# Patient Record
Sex: Male | Born: 1935 | Race: Black or African American | Hispanic: No | Smoking: Former smoker
Health system: Southern US, Community
[De-identification: ages and names within clinical notes are randomized; demographics above are authoritative.]

## PROBLEM LIST (undated history)

## (undated) DIAGNOSIS — J449 Chronic obstructive pulmonary disease, unspecified: Secondary | ICD-10-CM

## (undated) DIAGNOSIS — I10 Essential (primary) hypertension: Secondary | ICD-10-CM

## (undated) HISTORY — PX: TOTAL HIP ARTHROPLASTY: SHX124

## (undated) HISTORY — PX: LUMBAR DISC SURGERY: SHX700

---

## 1997-05-28 ENCOUNTER — Encounter (HOSPITAL_COMMUNITY): Admission: RE | Admit: 1997-05-28 | Discharge: 1997-08-26 | Payer: Self-pay | Admitting: Cardiology

## 2000-04-26 ENCOUNTER — Encounter: Admission: RE | Admit: 2000-04-26 | Discharge: 2000-04-26 | Payer: Self-pay | Admitting: Cardiology

## 2000-04-26 ENCOUNTER — Encounter: Payer: Self-pay | Admitting: Cardiology

## 2000-08-26 ENCOUNTER — Encounter (INDEPENDENT_AMBULATORY_CARE_PROVIDER_SITE_OTHER): Payer: Self-pay | Admitting: Specialist

## 2000-08-26 ENCOUNTER — Encounter: Payer: Self-pay | Admitting: *Deleted

## 2000-08-26 ENCOUNTER — Ambulatory Visit (HOSPITAL_COMMUNITY): Admission: RE | Admit: 2000-08-26 | Discharge: 2000-08-26 | Payer: Self-pay | Admitting: *Deleted

## 2002-06-19 ENCOUNTER — Encounter: Payer: Self-pay | Admitting: Cardiology

## 2002-06-19 ENCOUNTER — Encounter: Admission: RE | Admit: 2002-06-19 | Discharge: 2002-06-19 | Payer: Self-pay | Admitting: Cardiology

## 2003-07-30 ENCOUNTER — Encounter: Admission: RE | Admit: 2003-07-30 | Discharge: 2003-07-30 | Payer: Self-pay | Admitting: Cardiology

## 2003-09-06 ENCOUNTER — Ambulatory Visit (HOSPITAL_COMMUNITY): Admission: RE | Admit: 2003-09-06 | Discharge: 2003-09-06 | Payer: Self-pay | Admitting: Orthopedic Surgery

## 2004-06-03 ENCOUNTER — Ambulatory Visit (HOSPITAL_COMMUNITY): Admission: RE | Admit: 2004-06-03 | Discharge: 2004-06-03 | Payer: Self-pay | Admitting: *Deleted

## 2004-06-03 ENCOUNTER — Encounter (INDEPENDENT_AMBULATORY_CARE_PROVIDER_SITE_OTHER): Payer: Self-pay | Admitting: *Deleted

## 2004-11-05 ENCOUNTER — Encounter: Admission: RE | Admit: 2004-11-05 | Discharge: 2004-11-05 | Payer: Self-pay | Admitting: Cardiology

## 2005-03-25 ENCOUNTER — Encounter: Admission: RE | Admit: 2005-03-25 | Discharge: 2005-03-25 | Payer: Self-pay | Admitting: Cardiology

## 2007-09-21 ENCOUNTER — Encounter: Admission: RE | Admit: 2007-09-21 | Discharge: 2007-09-21 | Payer: Self-pay | Admitting: Cardiology

## 2007-10-04 ENCOUNTER — Encounter: Admission: RE | Admit: 2007-10-04 | Discharge: 2007-10-04 | Payer: Self-pay | Admitting: Cardiology

## 2010-04-15 ENCOUNTER — Ambulatory Visit
Admission: RE | Admit: 2010-04-15 | Discharge: 2010-04-15 | Disposition: A | Discharge status: Home / Self Care | Payer: Medicare Other | Source: Ambulatory Visit | Attending: Cardiology | Admitting: Cardiology

## 2010-04-15 ENCOUNTER — Other Ambulatory Visit: Payer: Self-pay | Admitting: Cardiology

## 2010-04-15 DIAGNOSIS — J449 Chronic obstructive pulmonary disease, unspecified: Secondary | ICD-10-CM

## 2010-07-17 NOTE — Op Note (Signed)
NAME:  Shawn Mathews, Shawn Mathews NO.:  [id number]   MEDICAL RECORD NO.:  [id number]                   PATIENT TYPE:  OIB   LOCATION:  2899                                 FACILITY:  [facility]   PHYSICIAN:  Myrtie Neither, M.D.                 DATE OF BIRTH:  [birth year]   DATE OF PROCEDURE:  09/06/2003  DATE OF DISCHARGE:                                 OPERATIVE REPORT   PREOPERATIVE DIAGNOSIS:  Carpal tunnel syndrome, right wrist.   POSTOPERATIVE DIAGNOSIS:  Carpal tunnel syndrome, right wrist.   ANESTHESIA:  General.   PROCEDURE:  Right carpal tunnel release.   The patient was taken to the operating room, after being given adequate  preop medication, given general anesthesia and intubated.  The right wrist  was prepped with DuraPrep and draped in a sterile manner.  Tourniquet and  bipolar used for hemostasis.  A curving incision was made over the volar  transverse crease of the right wrist, going through the skin and  subcutaneous tissue.  The median nerve was identified proximally.  The Glorious Peach  was placed underneath the fascial tissue and complete release of the  transverse carpal ligament was done.  Copious irrigation was done.  A  portion of the ligament was also resected.  The nerve, itself, was obviously  compressed at that level.  Irrigation was done and closure was done with  nylon.  A compressive dressing was applied and wrist splint applied.  The  patient tolerated the procedure quite well and was sent to the recovery room  in stable, satisfactory condition.  The patient is being discharged home on  Percocet 1 p.o. q.4h. p.r.n. pain, ice packs, elevation, use of a sling,  return to the office in one week.  The patient is being discharged in stable  and satisfactory condition.                                               Myrtie Neither, M.D.    AC/MEDQ  D:  09/06/2003  T:  09/06/2003  Job:  [id number]

## 2010-07-17 NOTE — H&P (Signed)
NAME:  CLETIS, CLACK NO.:  [id number]   MEDICAL RECORD NO.:  [id number]                   PATIENT TYPE:  OIB   LOCATION:  2899                                 FACILITY:  [facility]   PHYSICIAN:  Myrtie Neither, M.D.                 DATE OF BIRTH:  November 23, 1927   DATE OF ADMISSION:  09/06/2003  DATE OF DISCHARGE:                                HISTORY & PHYSICAL   CHIEF COMPLAINT:  Pain and numbness in the right hand.   HISTORY OF PRESENT ILLNESS:  This is a 77 year old who has been followed for  carpal tunnel syndrome bilateral wrist, right being worse than the left.  The patient has been treated with night splinting and anti-inflammatory with  more recent progressive worsening.  Nerve conduction tests does confirm  bilateral carpal tunnel syndrome, right being worse than the left.   PAST MEDICAL HISTORY:  Left total hip replacement in 1995 and in 1989.  Tonsillectomy and adenoidectomy as a child.  Lipoma removed from the left  shoulder.  High blood pressure.  History of blood clots.   ALLERGIES:  Penicillin.   MEDICATIONS:  Coumadin 5 mg, Decadron p.r.n., indomethacin 75 mg b.i.d.,  Diovan ACT 80/12.5 a.m., multi-vitamins.   SOCIAL HISTORY:  The patient does smoke 1/2 pack cigarettes per day.  History of use of alcohol.   FAMILY HISTORY:  Noncontributory.   REVIEW OF SYMPTOMS:  See history of present illness.  Otherwise, occasional  arthritic joint pains.   PHYSICAL EXAMINATION:  VITAL SIGNS:  Temperature 97.5, pulse 60, respirations 16, blood pressure  120/80, height 61 inches, weight 162 pounds.  HEENT:  Normocephalic, EOMI, sclerae clear.  NECK:  Supple.  CHEST:  Clear.  CARDIAC:  S1 and S2 regular.  EXTREMITIES:  Right hand with atrophy, positive Phalen's, positive Tinel's  sign, grip and pinch fair.  Some hypoesthesia in the long and index finger.  Nerve conduction tests demonstrate carpal tunnel syndrome right wrist.   IMPRESSION:  Carpal  tunnel syndrome, right wrist.   PLAN:  Right carpal tunnel release.                                               Myrtie Neither, M.D.   AC/MEDQ  D:  09/06/2003  T:  09/06/2003  Job:  [id number]

## 2011-03-30 ENCOUNTER — Emergency Department (INDEPENDENT_AMBULATORY_CARE_PROVIDER_SITE_OTHER)
Admission: EM | Admit: 2011-03-30 | Discharge: 2011-03-30 | Disposition: A | Discharge status: Home / Self Care | Payer: Medicare Other | Source: Home / Self Care | Attending: Emergency Medicine | Admitting: Emergency Medicine

## 2011-03-30 ENCOUNTER — Encounter (HOSPITAL_COMMUNITY): Payer: Self-pay | Admitting: *Deleted

## 2011-03-30 ENCOUNTER — Emergency Department (INDEPENDENT_AMBULATORY_CARE_PROVIDER_SITE_OTHER): Payer: Medicare Other

## 2011-03-30 ENCOUNTER — Other Ambulatory Visit: Payer: Self-pay

## 2011-03-30 DIAGNOSIS — W19XXXA Unspecified fall, initial encounter: Secondary | ICD-10-CM

## 2011-03-30 DIAGNOSIS — S7010XA Contusion of unspecified thigh, initial encounter: Secondary | ICD-10-CM

## 2011-03-30 DIAGNOSIS — R55 Syncope and collapse: Secondary | ICD-10-CM

## 2011-03-30 DIAGNOSIS — S8000XA Contusion of unspecified knee, initial encounter: Secondary | ICD-10-CM

## 2011-03-30 DIAGNOSIS — IMO0002 Reserved for concepts with insufficient information to code with codable children: Secondary | ICD-10-CM

## 2011-03-30 HISTORY — DX: Essential (primary) hypertension: I10

## 2011-03-30 MED ORDER — HYDROCODONE-ACETAMINOPHEN 5-500 MG PO TABS: 1.0000 | ORAL_TABLET | Freq: Four times a day (QID) | ORAL | Status: AC | PRN

## 2011-03-30 NOTE — ED Notes (Signed)
Pt reports he fell about a week ago at home.  He thinks he passed out and fell on the floor  from a chair.    He has been  having right knee and hip pain and did walk with a limp, but the pain has gotten worse

## 2011-03-30 NOTE — ED Provider Notes (Addendum)
History     CSN: [id number]  Arrival date & time 03/30/11  1050   First MD Initiated Contact with Patient 03/30/11 1106      Chief Complaint  Patient presents with  . Fall    (Consider location/radiation/quality/duration/timing/severity/associated sxs/prior treatment) HPI Comments: At home about 1 week ago, I stood up from a chair and passed out and apparently vomited, since then my R hip and R knee been hurting"   Patient is a 78 y.o. male presenting with fall. The history is provided by a relative and the patient.  Fall The accident occurred more than 1 week ago. The fall occurred while standing. He fell from a height of 1 to 2 ft. He landed on carpet. There was no blood loss. The point of impact was the right knee. The pain is present in the right knee. The pain is at a severity of 7/10. He was ambulatory at the scene. There was no entrapment after the fall. There was no alcohol use involved in the accident. Associated symptoms include vomiting and loss of consciousness. Pertinent negatives include no abdominal pain, no bowel incontinence, no hearing loss and no tingling. The symptoms are aggravated by activity, ambulation and sitting. He has tried nothing for the symptoms. The treatment provided no relief.    Past Medical History  Diagnosis Date  . Hypertension   . Gout   . Glaucoma     Past Surgical History  Procedure Date  . Total hip arthroplasty   . Lumbar disc surgery     Family History  Problem Relation Age of Onset  . Diabetes Brother   . Coronary artery disease Brother   . Stroke Brother   . Diabetes Mother     History  Substance Use Topics  . Smoking status: Former Smoker -- 30 years    Quit date: 03/29/1986  . Smokeless tobacco: Not on file  . Alcohol Use: No      Review of Systems  Gastrointestinal: Positive for vomiting. Negative for abdominal pain and bowel incontinence.  Neurological: Positive for loss of consciousness. Negative for tingling.      Allergies  Review of patient's allergies indicates no known allergies.  Home Medications   Current Outpatient Rx  Name Route Sig Dispense Refill  . ALLOPURINOL 100 MG PO TABS Oral Take by mouth daily.    Marland Kitchen AMLODIPINE BESYLATE 5 MG PO TABS Oral Take 5 mg by mouth daily.    . COLCHICINE 0.6 MG PO TABS Oral Take 0.6 mg by mouth 2 (two) times daily.    Marland Kitchen HYDROCHLOROTHIAZIDE 12.5 MG PO CAPS Oral Take 12.5 mg by mouth daily.    Marland Kitchen OLMESARTAN MEDOXOMIL 40 MG PO TABS Oral Take 40 mg by mouth daily.      BP 97/67  Pulse 89  Temp(Src) 97.8 F (36.6 C) (Oral)  Resp 20  SpO2 96%  Physical Exam  Nursing note and vitals reviewed. Constitutional: He appears well-nourished. No distress.  Eyes: Conjunctivae are normal.  Neck: Neck supple.  Cardiovascular: Regular rhythm.   Pulmonary/Chest: Breath sounds normal. No accessory muscle usage. No respiratory distress. He has no decreased breath sounds. He has no wheezes. He has no rhonchi.  Abdominal: There is no tenderness.  Musculoskeletal:       Right hip: He exhibits decreased range of motion and tenderness. He exhibits normal strength, no swelling, no crepitus and no deformity.       Right knee: He exhibits decreased range of motion and bony  tenderness. He exhibits normal alignment and normal patellar mobility. tenderness found. Medial joint line and lateral joint line tenderness noted.  Skin: No rash noted. No erythema.    ED Course  Procedures (including critical care time)  Labs Reviewed - No data to display No results found.   No diagnosis found.    MDM  Reports syncopal episode last Wednesday, and R hip and R knee pain since fall 7 days ago. Denies presyncopal episodes, occurred after standing up abruptly, non significant abnormalities on EKG today        Jimmie Molly, MD 03/30/11 1222  Jimmie Molly, MD 03/31/11 [phone]

## 2011-06-09 ENCOUNTER — Other Ambulatory Visit: Payer: Self-pay | Admitting: Cardiology

## 2011-06-09 DIAGNOSIS — R55 Syncope and collapse: Secondary | ICD-10-CM

## 2011-06-11 ENCOUNTER — Telehealth: Payer: Self-pay

## 2011-06-15 ENCOUNTER — Ambulatory Visit
Admission: RE | Admit: 2011-06-15 | Discharge: 2011-06-15 | Disposition: A | Discharge status: Home / Self Care | Payer: Medicare Other | Source: Ambulatory Visit | Attending: Cardiology | Admitting: Cardiology

## 2011-06-15 DIAGNOSIS — R55 Syncope and collapse: Secondary | ICD-10-CM

## 2011-06-16 ENCOUNTER — Encounter (INDEPENDENT_AMBULATORY_CARE_PROVIDER_SITE_OTHER): Payer: Medicare Other

## 2011-06-16 DIAGNOSIS — R55 Syncope and collapse: Secondary | ICD-10-CM

## 2011-10-20 NOTE — Telephone Encounter (Signed)
Patient had monitor

## 2013-10-24 ENCOUNTER — Other Ambulatory Visit (HOSPITAL_COMMUNITY): Payer: Self-pay | Admitting: Cardiology

## 2013-10-24 DIAGNOSIS — R5381 Other malaise: Secondary | ICD-10-CM

## 2013-10-24 DIAGNOSIS — R5383 Other fatigue: Principal | ICD-10-CM

## 2013-10-24 DIAGNOSIS — R109 Unspecified abdominal pain: Secondary | ICD-10-CM

## 2013-10-24 DIAGNOSIS — R000 Tachycardia, unspecified: Secondary | ICD-10-CM

## 2013-10-24 DIAGNOSIS — R52 Pain, unspecified: Secondary | ICD-10-CM

## 2013-10-24 DIAGNOSIS — R188 Other ascites: Secondary | ICD-10-CM

## 2013-10-25 ENCOUNTER — Other Ambulatory Visit (HOSPITAL_COMMUNITY): Payer: Self-pay | Admitting: Unknown Physician Specialty

## 2013-10-25 DIAGNOSIS — R011 Cardiac murmur, unspecified: Secondary | ICD-10-CM

## 2013-10-25 DIAGNOSIS — I159 Secondary hypertension, unspecified: Secondary | ICD-10-CM

## 2013-10-29 ENCOUNTER — Inpatient Hospital Stay (HOSPITAL_COMMUNITY): Payer: Medicare Other

## 2013-10-29 ENCOUNTER — Ambulatory Visit (HOSPITAL_COMMUNITY)
Admission: RE | Admit: 2013-10-29 | Discharge: 2013-10-29 | Disposition: A | Discharge status: Home / Self Care | Payer: Medicare Other | Source: Ambulatory Visit | Attending: Cardiology | Admitting: Cardiology

## 2013-10-29 ENCOUNTER — Emergency Department (HOSPITAL_COMMUNITY): Payer: Medicare Other

## 2013-10-29 ENCOUNTER — Inpatient Hospital Stay (HOSPITAL_COMMUNITY)
Admission: EM | Admit: 2013-10-29 | Discharge: 2013-11-29 | DRG: 266 | Disposition: E | Payer: Medicare Other | Attending: Vascular Surgery | Admitting: Vascular Surgery

## 2013-10-29 ENCOUNTER — Encounter (HOSPITAL_COMMUNITY): Payer: Self-pay | Admitting: Emergency Medicine

## 2013-10-29 DIAGNOSIS — D649 Anemia, unspecified: Secondary | ICD-10-CM | POA: Diagnosis present

## 2013-10-29 DIAGNOSIS — I080 Rheumatic disorders of both mitral and aortic valves: Secondary | ICD-10-CM | POA: Diagnosis present

## 2013-10-29 DIAGNOSIS — K3189 Other diseases of stomach and duodenum: Secondary | ICD-10-CM | POA: Diagnosis present

## 2013-10-29 DIAGNOSIS — E43 Unspecified severe protein-calorie malnutrition: Secondary | ICD-10-CM | POA: Diagnosis present

## 2013-10-29 DIAGNOSIS — Z006 Encounter for examination for normal comparison and control in clinical research program: Secondary | ICD-10-CM | POA: Diagnosis not present

## 2013-10-29 DIAGNOSIS — I129 Hypertensive chronic kidney disease with stage 1 through stage 4 chronic kidney disease, or unspecified chronic kidney disease: Secondary | ICD-10-CM | POA: Diagnosis present

## 2013-10-29 DIAGNOSIS — M1990 Unspecified osteoarthritis, unspecified site: Secondary | ICD-10-CM | POA: Diagnosis present

## 2013-10-29 DIAGNOSIS — Z66 Do not resuscitate: Secondary | ICD-10-CM | POA: Diagnosis not present

## 2013-10-29 DIAGNOSIS — N5089 Other specified disorders of the male genital organs: Secondary | ICD-10-CM | POA: Diagnosis present

## 2013-10-29 DIAGNOSIS — R7309 Other abnormal glucose: Secondary | ICD-10-CM | POA: Diagnosis not present

## 2013-10-29 DIAGNOSIS — R6520 Severe sepsis without septic shock: Secondary | ICD-10-CM

## 2013-10-29 DIAGNOSIS — IMO0002 Reserved for concepts with insufficient information to code with codable children: Secondary | ICD-10-CM | POA: Diagnosis not present

## 2013-10-29 DIAGNOSIS — R579 Shock, unspecified: Secondary | ICD-10-CM

## 2013-10-29 DIAGNOSIS — D696 Thrombocytopenia, unspecified: Secondary | ICD-10-CM | POA: Diagnosis present

## 2013-10-29 DIAGNOSIS — R570 Cardiogenic shock: Secondary | ICD-10-CM | POA: Diagnosis not present

## 2013-10-29 DIAGNOSIS — N289 Disorder of kidney and ureter, unspecified: Secondary | ICD-10-CM | POA: Diagnosis present

## 2013-10-29 DIAGNOSIS — I714 Abdominal aortic aneurysm, without rupture, unspecified: Secondary | ICD-10-CM | POA: Diagnosis present

## 2013-10-29 DIAGNOSIS — E872 Acidosis, unspecified: Secondary | ICD-10-CM | POA: Diagnosis present

## 2013-10-29 DIAGNOSIS — I359 Nonrheumatic aortic valve disorder, unspecified: Secondary | ICD-10-CM

## 2013-10-29 DIAGNOSIS — J811 Chronic pulmonary edema: Secondary | ICD-10-CM | POA: Diagnosis not present

## 2013-10-29 DIAGNOSIS — Z515 Encounter for palliative care: Secondary | ICD-10-CM | POA: Diagnosis not present

## 2013-10-29 DIAGNOSIS — I350 Nonrheumatic aortic (valve) stenosis: Secondary | ICD-10-CM | POA: Diagnosis present

## 2013-10-29 DIAGNOSIS — E8779 Other fluid overload: Secondary | ICD-10-CM | POA: Diagnosis not present

## 2013-10-29 DIAGNOSIS — R011 Cardiac murmur, unspecified: Secondary | ICD-10-CM | POA: Diagnosis present

## 2013-10-29 DIAGNOSIS — E876 Hypokalemia: Secondary | ICD-10-CM | POA: Diagnosis present

## 2013-10-29 DIAGNOSIS — R1013 Epigastric pain: Secondary | ICD-10-CM

## 2013-10-29 DIAGNOSIS — D689 Coagulation defect, unspecified: Secondary | ICD-10-CM | POA: Diagnosis present

## 2013-10-29 DIAGNOSIS — N179 Acute kidney failure, unspecified: Secondary | ICD-10-CM

## 2013-10-29 DIAGNOSIS — R6521 Severe sepsis with septic shock: Secondary | ICD-10-CM

## 2013-10-29 DIAGNOSIS — H409 Unspecified glaucoma: Secondary | ICD-10-CM | POA: Diagnosis present

## 2013-10-29 DIAGNOSIS — R109 Unspecified abdominal pain: Secondary | ICD-10-CM

## 2013-10-29 DIAGNOSIS — J9600 Acute respiratory failure, unspecified whether with hypoxia or hypercapnia: Secondary | ICD-10-CM | POA: Diagnosis not present

## 2013-10-29 DIAGNOSIS — Z87891 Personal history of nicotine dependence: Secondary | ICD-10-CM | POA: Diagnosis not present

## 2013-10-29 DIAGNOSIS — N189 Chronic kidney disease, unspecified: Secondary | ICD-10-CM | POA: Diagnosis present

## 2013-10-29 DIAGNOSIS — Z880 Allergy status to penicillin: Secondary | ICD-10-CM | POA: Diagnosis not present

## 2013-10-29 DIAGNOSIS — Z79899 Other long term (current) drug therapy: Secondary | ICD-10-CM | POA: Diagnosis not present

## 2013-10-29 DIAGNOSIS — R1084 Generalized abdominal pain: Secondary | ICD-10-CM | POA: Diagnosis present

## 2013-10-29 DIAGNOSIS — K559 Vascular disorder of intestine, unspecified: Secondary | ICD-10-CM | POA: Diagnosis not present

## 2013-10-29 DIAGNOSIS — I2510 Atherosclerotic heart disease of native coronary artery without angina pectoris: Secondary | ICD-10-CM | POA: Diagnosis present

## 2013-10-29 DIAGNOSIS — Z96649 Presence of unspecified artificial hip joint: Secondary | ICD-10-CM | POA: Diagnosis not present

## 2013-10-29 DIAGNOSIS — I743 Embolism and thrombosis of arteries of the lower extremities: Secondary | ICD-10-CM | POA: Diagnosis not present

## 2013-10-29 DIAGNOSIS — I159 Secondary hypertension, unspecified: Secondary | ICD-10-CM

## 2013-10-29 DIAGNOSIS — I498 Other specified cardiac arrhythmias: Secondary | ICD-10-CM | POA: Diagnosis not present

## 2013-10-29 DIAGNOSIS — I5189 Other ill-defined heart diseases: Secondary | ICD-10-CM | POA: Diagnosis not present

## 2013-10-29 DIAGNOSIS — A419 Sepsis, unspecified organism: Secondary | ICD-10-CM | POA: Diagnosis present

## 2013-10-29 DIAGNOSIS — R601 Generalized edema: Secondary | ICD-10-CM

## 2013-10-29 DIAGNOSIS — J9601 Acute respiratory failure with hypoxia: Secondary | ICD-10-CM

## 2013-10-29 DIAGNOSIS — R34 Anuria and oliguria: Secondary | ICD-10-CM | POA: Diagnosis not present

## 2013-10-29 DIAGNOSIS — R52 Pain, unspecified: Secondary | ICD-10-CM

## 2013-10-29 DIAGNOSIS — M109 Gout, unspecified: Secondary | ICD-10-CM | POA: Diagnosis present

## 2013-10-29 DIAGNOSIS — I214 Non-ST elevation (NSTEMI) myocardial infarction: Secondary | ICD-10-CM | POA: Diagnosis present

## 2013-10-29 DIAGNOSIS — K55029 Acute infarction of small intestine, extent unspecified: Secondary | ICD-10-CM

## 2013-10-29 DIAGNOSIS — I493 Ventricular premature depolarization: Secondary | ICD-10-CM

## 2013-10-29 DIAGNOSIS — J441 Chronic obstructive pulmonary disease with (acute) exacerbation: Secondary | ICD-10-CM | POA: Diagnosis present

## 2013-10-29 DIAGNOSIS — K769 Liver disease, unspecified: Secondary | ICD-10-CM | POA: Diagnosis present

## 2013-10-29 DIAGNOSIS — K5730 Diverticulosis of large intestine without perforation or abscess without bleeding: Secondary | ICD-10-CM | POA: Diagnosis present

## 2013-10-29 DIAGNOSIS — R0600 Dyspnea, unspecified: Secondary | ICD-10-CM

## 2013-10-29 HISTORY — DX: Chronic obstructive pulmonary disease, unspecified: J44.9

## 2013-10-29 LAB — CBC WITH DIFFERENTIAL/PLATELET
BASOS ABS: 0 10*3/uL (ref 0.0–0.1)
BASOS PCT: 0 % (ref 0–1)
EOS ABS: 0.2 10*3/uL (ref 0.0–0.7)
Eosinophils Relative: 3 % (ref 0–5)
HEMATOCRIT: 42.1 % (ref 39.0–52.0)
HEMOGLOBIN: 14.2 g/dL (ref 13.0–17.0)
Lymphocytes Relative: 27 % (ref 12–46)
Lymphs Abs: 1.7 10*3/uL (ref 0.7–4.0)
MCH: 32.1 pg (ref 26.0–34.0)
MCHC: 33.7 g/dL (ref 30.0–36.0)
MCV: 95.2 fL (ref 78.0–100.0)
MONO ABS: 0.6 10*3/uL (ref 0.1–1.0)
MONOS PCT: 9 % (ref 3–12)
NEUTROS PCT: 61 % (ref 43–77)
Neutro Abs: 4 10*3/uL (ref 1.7–7.7)
Platelets: 92 10*3/uL — ABNORMAL LOW (ref 150–400)
RBC: 4.42 MIL/uL (ref 4.22–5.81)
RDW: 15.9 % — AB (ref 11.5–15.5)
WBC: 6.5 10*3/uL (ref 4.0–10.5)

## 2013-10-29 LAB — I-STAT CHEM 8, ED
BUN: 15 mg/dL (ref 6–23)
CHLORIDE: 107 meq/L (ref 96–112)
Calcium, Ion: 1.23 mmol/L (ref 1.13–1.30)
Creatinine, Ser: 1 mg/dL (ref 0.50–1.35)
Glucose, Bld: 91 mg/dL (ref 70–99)
HEMATOCRIT: 44 % (ref 39.0–52.0)
Hemoglobin: 15 g/dL (ref 13.0–17.0)
Potassium: 3.8 mEq/L (ref 3.7–5.3)
SODIUM: 139 meq/L (ref 137–147)
TCO2: 23 mmol/L (ref 0–100)

## 2013-10-29 LAB — COMPREHENSIVE METABOLIC PANEL
ALK PHOS: 54 U/L (ref 39–117)
ALT: 10 U/L (ref 0–53)
AST: 28 U/L (ref 0–37)
Albumin: 3.5 g/dL (ref 3.5–5.2)
Anion gap: 14 (ref 5–15)
BILIRUBIN TOTAL: 0.6 mg/dL (ref 0.3–1.2)
BUN: 14 mg/dL (ref 6–23)
CALCIUM: 9 mg/dL (ref 8.4–10.5)
CO2: 22 mEq/L (ref 19–32)
CREATININE: 0.92 mg/dL (ref 0.50–1.35)
Chloride: 102 mEq/L (ref 96–112)
GFR, EST AFRICAN AMERICAN: 87 mL/min — AB (ref >90)
GFR, EST NON AFRICAN AMERICAN: 75 mL/min — AB (ref >90)
Glucose, Bld: 91 mg/dL (ref 70–99)
Potassium: 4 mEq/L (ref 3.7–5.3)
Sodium: 138 mEq/L (ref 137–147)
Total Protein: 6.5 g/dL (ref 6.0–8.3)

## 2013-10-29 LAB — PREPARE RBC (CROSSMATCH)

## 2013-10-29 LAB — I-STAT CG4 LACTIC ACID, ED: Lactic Acid, Venous: 0.87 mmol/L (ref 0.5–2.2)

## 2013-10-29 LAB — ABO/RH: ABO/RH(D): A POS

## 2013-10-29 LAB — LIPASE, BLOOD: LIPASE: 25 U/L (ref 11–59)

## 2013-10-29 LAB — TROPONIN I: Troponin I: <0.3 ng/mL (ref <0.30)

## 2013-10-29 MED ORDER — ACETAMINOPHEN 325 MG PO TABS: 325.0000 mg | ORAL_TABLET | ORAL | Status: DC | PRN

## 2013-10-29 MED ORDER — LABETALOL HCL 5 MG/ML IV SOLN
10.0000 mg | INTRAVENOUS | Status: DC | PRN
  Filled 2013-10-29: qty 4

## 2013-10-29 MED ORDER — IOHEXOL 350 MG/ML SOLN
100.0000 mL | Freq: Once | INTRAVENOUS | Status: AC | PRN
  Administered 2013-10-29: 100 mL via INTRAVENOUS

## 2013-10-29 MED ORDER — METOPROLOL TARTRATE 1 MG/ML IV SOLN: 2.5000 mg | Freq: Four times a day (QID) | INTRAVENOUS | Status: DC | PRN

## 2013-10-29 MED ORDER — METOPROLOL TARTRATE 1 MG/ML IV SOLN: 2.0000 mg | INTRAVENOUS | Status: DC | PRN

## 2013-10-29 MED ORDER — DOCUSATE SODIUM 100 MG PO CAPS
100.0000 mg | ORAL_CAPSULE | Freq: Two times a day (BID) | ORAL | Status: DC
  Administered 2013-10-29 – 2013-11-05 (×14): 100 mg via ORAL
  Filled 2013-10-29 (×18): qty 1

## 2013-10-29 MED ORDER — TRAMADOL HCL 50 MG PO TABS
50.0000 mg | ORAL_TABLET | Freq: Four times a day (QID) | ORAL | Status: DC | PRN
  Administered 2013-11-04: 50 mg via ORAL
  Filled 2013-10-29: qty 1

## 2013-10-29 MED ORDER — PANTOPRAZOLE SODIUM 40 MG PO TBEC
40.0000 mg | DELAYED_RELEASE_TABLET | Freq: Every day | ORAL | Status: DC
  Administered 2013-10-29 – 2013-11-01 (×4): 40 mg via ORAL
  Filled 2013-10-29 (×4): qty 1

## 2013-10-29 MED ORDER — ONDANSETRON HCL 4 MG/2ML IJ SOLN
4.0000 mg | Freq: Four times a day (QID) | INTRAMUSCULAR | Status: DC | PRN
  Administered 2013-11-06: 4 mg via INTRAVENOUS
  Filled 2013-10-29: qty 2

## 2013-10-29 MED ORDER — ALLOPURINOL 100 MG PO TABS
100.0000 mg | ORAL_TABLET | Freq: Every day | ORAL | Status: DC
  Administered 2013-10-30 – 2013-11-05 (×7): 100 mg via ORAL
  Filled 2013-10-29 (×9): qty 1

## 2013-10-29 MED ORDER — SODIUM CHLORIDE 0.9 % IV SOLN
INTRAVENOUS | Status: DC
  Administered 2013-10-29 – 2013-10-31 (×3): via INTRAVENOUS

## 2013-10-29 MED ORDER — ACETAMINOPHEN 650 MG RE SUPP: 325.0000 mg | RECTAL | Status: DC | PRN

## 2013-10-29 MED ORDER — DORZOLAMIDE HCL 2 % OP SOLN
1.0000 [drp] | Freq: Two times a day (BID) | OPHTHALMIC | Status: DC
  Administered 2013-10-29 – 2013-11-20 (×44): 1 [drp] via OPHTHALMIC
  Filled 2013-10-29 (×5): qty 10

## 2013-10-29 MED ORDER — GUAIFENESIN-DM 100-10 MG/5ML PO SYRP: 15.0000 mL | ORAL_SOLUTION | ORAL | Status: DC | PRN

## 2013-10-29 MED ORDER — SODIUM CHLORIDE 0.9 % IV SOLN: Freq: Once | INTRAVENOUS | Status: DC

## 2013-10-29 MED ORDER — ALUM & MAG HYDROXIDE-SIMETH 200-200-20 MG/5ML PO SUSP: 15.0000 mL | ORAL | Status: DC | PRN

## 2013-10-29 MED ORDER — HYDRALAZINE HCL 20 MG/ML IJ SOLN: 10.0000 mg | INTRAMUSCULAR | Status: DC | PRN

## 2013-10-29 MED ORDER — AMLODIPINE BESYLATE 5 MG PO TABS
5.0000 mg | ORAL_TABLET | Freq: Every day | ORAL | Status: DC
  Administered 2013-10-29 – 2013-11-05 (×8): 5 mg via ORAL
  Filled 2013-10-29 (×10): qty 1

## 2013-10-29 MED ORDER — POTASSIUM CHLORIDE CRYS ER 20 MEQ PO TBCR
20.0000 meq | EXTENDED_RELEASE_TABLET | Freq: Once | ORAL | Status: AC
  Administered 2013-10-29: 20 meq via ORAL
  Filled 2013-10-29: qty 1

## 2013-10-29 MED ORDER — PHENOL 1.4 % MT LIQD: 1.0000 | OROMUCOSAL | Status: DC | PRN

## 2013-10-29 MED ORDER — ENOXAPARIN SODIUM 40 MG/0.4ML ~~LOC~~ SOLN: 40.0000 mg | SUBCUTANEOUS | Status: DC

## 2013-10-29 MED ORDER — MORPHINE SULFATE 2 MG/ML IJ SOLN: 2.0000 mg | INTRAMUSCULAR | Status: DC | PRN

## 2013-10-29 NOTE — ED Provider Notes (Signed)
CSN: [id number]     Arrival date & time 06-Nov-2013  1536 History   First MD Initiated Contact with Patient 11/09/2013 1617     Chief Complaint  Patient presents with  . Abdominal Pain     (Consider location/radiation/quality/duration/timing/severity/associated sxs/prior Treatment) HPI Comments: 80 year old male past smoker, hip replacement, high blood pressure, gout presents with abdominal pain and new diagnosis of abdominal aortic aneurysm. Patient had ultrasound done earlier today showing significant size aneurysm. Patient's had nonspecific abdominal pain for 1-2 weeks central nonradiating. Patient denies bleeding or significant blood thinners. No history of aneurysm.  Patient is a 80 y.o. male presenting with abdominal pain. The history is provided by the patient.  Abdominal Pain Associated symptoms: no chest pain, no chills, no diarrhea, no dysuria, no fever, no shortness of breath and no vomiting     Past Medical History  Diagnosis Date  . Hypertension   . Gout   . Glaucoma    Past Surgical History  Procedure Laterality Date  . Total hip arthroplasty    . Lumbar disc surgery     Family History  Problem Relation Age of Onset  . Diabetes Brother   . Coronary artery disease Brother   . Stroke Brother   . Diabetes Mother    History  Substance Use Topics  . Smoking status: Former Smoker -- 30 years    Quit date: 03/29/1986  . Smokeless tobacco: Not on file  . Alcohol Use: No    Review of Systems  Constitutional: Negative for fever and chills.  HENT: Negative for congestion.   Eyes: Negative for visual disturbance.  Respiratory: Negative for shortness of breath.   Cardiovascular: Negative for chest pain.  Gastrointestinal: Positive for abdominal pain. Negative for vomiting and diarrhea.  Genitourinary: Negative for dysuria and flank pain.  Musculoskeletal: Negative for back pain, neck pain and neck stiffness.  Skin: Negative for rash.  Neurological: Negative for  light-headedness and headaches.      Allergies  Penicillins  Home Medications   Prior to Admission medications   Medication Sig Start Date End Date Taking? Authorizing Provider  allopurinol (ZYLOPRIM) 100 MG tablet Take by mouth daily.   Yes Historical Provider, MD  amLODipine (NORVASC) 5 MG tablet Take 5 mg by mouth daily.   Yes Historical Provider, MD  dorzolamide (TRUSOPT) 2 % ophthalmic solution Place 1 drop into both eyes 2 (two) times daily.  10/11/13  Yes Historical Provider, MD  traMADol (ULTRAM) 50 MG tablet Take 50 mg by mouth every 6 (six) hours as needed for moderate pain.  10/24/13  Yes Historical Provider, MD   BP 145/93  Pulse 67  Temp(Src) 98.2 F (36.8 C) (Oral)  Resp 18  Wt 151 lb 4 oz (68.607 kg)  SpO2 93% Physical Exam  Nursing note and vitals reviewed. Constitutional: He is oriented to person, place, and time. He appears well-developed and well-nourished. No distress.  HENT:  Head: Normocephalic and atraumatic.  Eyes: Conjunctivae are normal. Right eye exhibits no discharge. Left eye exhibits no discharge.  Neck: Normal range of motion. Neck supple. No tracheal deviation present.  Cardiovascular: Normal rate and regular rhythm.   Pulmonary/Chest: Effort normal and breath sounds normal.  Abdominal: Soft. He exhibits distension. There is tenderness (tender mild central, mild distended/pulsatile mass). There is no guarding.  Musculoskeletal: He exhibits no edema.  Neurological: He is alert and oriented to person, place, and time.  Skin: Skin is warm. No rash noted.  Psychiatric: He has a normal  mood and affect.    ED Course  Procedures (including critical care time) CRITICAL CARE Performed by: Enid Skeens   Total critical care time: 35 min  Critical care time was exclusive of separately billable procedures and treating other patients.  Critical care was necessary to treat or prevent imminent or life-threatening deterioration.  Critical care was  time spent personally by me on the following activities: development of treatment plan with patient and/or surrogate as well as nursing, discussions with consultants, evaluation of patient's response to treatment, examination of patient, obtaining history from patient or surrogate, ordering and performing treatments and interventions, ordering and review of laboratory studies, ordering and review of radiographic studies, pulse oximetry and re-evaluation of patient's condition.  Labs Review Labs Reviewed  CBC WITH DIFFERENTIAL - Abnormal; Notable for the following:    RDW 15.9 (*)    Platelets 92 (*)    All other components within normal limits  COMPREHENSIVE METABOLIC PANEL - Abnormal; Notable for the following:    GFR calc non Af Amer 75 (*)    GFR calc Af Amer 87 (*)    All other components within normal limits  TROPONIN I  LIPASE, BLOOD  I-STAT CHEM 8, ED  I-STAT CG4 LACTIC ACID, ED  TYPE AND SCREEN  ABO/RH  PREPARE RBC (CROSSMATCH)    Imaging Review US Abdomen Complete  11/04/13   ADDENDUM REPORT: 11/04/13 12:47  ADDENDUM: Findings and recommendations were discussed with Dr. Shana Chute at 12:40 p.m. As patient has no abdominal pain, the findings are likely stable/chronic. Further evaluation with CT was recommended on an urgent, but not emergent basis.   Electronically Signed   By: Elberta Fortis M.D.   On: November 04, 2013 12:47   11/04/2013   CLINICAL DATA:  Acute abdominal pain.  EXAM: ULTRASOUND ABDOMEN COMPLETE  COMPARISON:  None.  FINDINGS: Exam somewhat limited due to patient body habitus and abundant overlying bowel gas.  Gallbladder:  No gallstones or wall thickening visualized. No sonographic Murphy sign noted.  Common bile duct:  Diameter: 4 mm.  Liver:  No focal lesion identified. Within normal limits in parenchymal echogenicity.  IVC:  Not visualized.  Pancreas:  Visualized portion unremarkable.  Spleen:  Size and appearance within normal limits.  Right Kidney:  Length: 8.6 cm.  Echogenicity within normal limits. No mass or hydronephrosis visualized.  Left Kidney:  Length: 8.7 cm. Echogenicity within normal limits. No mass or hydronephrosis visualized.  Abdominal aorta:  Proximal segment not visualized. Moderate atherosclerotic disease with significant aneurysmal dilatation of the mid to distal abdominal aorta measuring approximately 7.8 x 8.1 cm in its AP and transverse dimensions and extending approximately 10.8 cm in length. There is moderate mural thrombus as the lumen measures approximately 2.2 x 3.2 cm in AP and transverse dimension.  Other findings:  None.  IMPRESSION: Significant aneurysm of the mid to distal abdominal aorta measuring approximately 7.8 x 8.1 cm in its AP and transverse dimensions and extending 10.8 cm in length. Moderate mural thrombus as the lumen measures 2.2 x 3.2 cm. Recommend CT abdomen with and without contrast to exclude contained rupture in this patient with abdominal pain.  These results were called by telephone at the time of interpretation on Nov 04, 2013 at 11:11 am to Dr. Kevin Fenton SPRUILL's recepeptionist, Frederick Peers, , who verbally acknowledged these results. We are unable to reach Dr. Shana Chute at this time. I therefore called patient directly as patient states he has no known history of abdominal aneurysm and states he has  no abdominal pain. We discussed that he needs further evaluation with CT scan and that Dr. Magda Kiel office will be contacting him to scheduled the exam. We discussed that he should proceed to the nearest ER if he experiences abdominal pain.  Electronically Signed: By: Elberta Fortis M.D. On: 10/28/2013 11:26     EKG Interpretation None      MDM   Final diagnoses:  Abdominal aortic aneurysm greater than 39 mm in diameter  Abdominal pain, generalized   Patient with new diagnosis of abdominal area aneurysm and abdominal pain. Paged vascular surgery immediately and they're on their way into the hospital to evaluate. Type  and screen and type and cross ordered and plan for likely surgery.  Vitals okay in ER.  Patient admitted by vascular surgery to plan for surgery. Pain mild in ED. The patients results and plan were reviewed and discussed.   Any x-rays performed were personally reviewed by myself.   Differential diagnosis were considered with the presenting HPI.  Medications  0.9 %  sodium chloride infusion (not administered)  allopurinol (ZYLOPRIM) tablet 100 mg (not administered)  amLODipine (NORVASC) tablet 5 mg (not administered)  dorzolamide (TRUSOPT) 2 % ophthalmic solution 1 drop (not administered)  traMADol (ULTRAM) tablet 50 mg (not administered)  metoprolol (LOPRESSOR) injection 2.5 mg (not administered)  iohexol (OMNIPAQUE) 350 MG/ML injection 100 mL (100 mLs Intravenous Contrast Given 10/14/2013 1825)      Filed Vitals:   10/02/2013 1546 10/09/2013 1619 10/08/2013 1630 10/23/2013 1720  BP: 140/86 137/77  145/93  Pulse: 69 64 71 67  Temp: 98.2 F (36.8 C)     TempSrc: Oral     Resp: Weight: 151 lb 4 oz (68.607 kg)     SpO2: 92% 95% 96% 93%    Admission/ observation were discussed with the admitting physician, patient and/or family and they are comfortable with the plan.      Enid Skeens, MD 10/05/2013 [phone]

## 2013-10-29 NOTE — Progress Notes (Signed)
  Echocardiogram 2D Echocardiogram has been performed.  Shawn Mathews [name] 10/21/2013, 2:07 PM

## 2013-10-29 NOTE — ED Notes (Signed)
Pt transported to CT ?

## 2013-10-29 NOTE — ED Notes (Signed)
Diet tray ordered 

## 2013-10-29 NOTE — ED Notes (Addendum)
Pt reports he had an [place] and CT scan today was diagnosed with 8 mm abdominal aneurysm today. Was sent here by radiologist. Pt had been having abdominal pain for several weeks. Denies n/v. Reports 3/10 pain. Is a x 4. Denies CP. Skin warm and dry. In NAD

## 2013-10-29 NOTE — ED Notes (Signed)
CT called- will get pt to scanner after lab results.

## 2013-10-29 NOTE — ED Notes (Signed)
Pt eating dinner tray then will take pt to floor.

## 2013-10-29 NOTE — H&P (Signed)
   History and Physical  See my consultation dated 10/02/2013   [name], MD [facility] Office: [phone] Pager: [phone]  10/01/2013, 7:25 PM

## 2013-10-29 NOTE — ED Notes (Signed)
Attempted report X1

## 2013-10-29 NOTE — Consult Note (Addendum)
Referred by: [facility]  Reason for referral: AAA  History of Present Illness  The patient is a 80 y.o. (Jul 27, 1927) male who presents with chief complaint: abdominal pain.  Pain started 2 weeks ago without obvious trigger.  Pain has vague character, 4/10, without any radiation or associated sx.  The patient was seen by his PCP and sent for abdominal ultrasound which demonstrates a 8 cm AAA today.  He was triaged to the ED for evaluation for sx.  AAA.  The patient does not history of embolic episodes from the AAA.  The patient's risk factors for AAA included: age, male sex and prior smoking.  The patient smoked cigarettes previous, quitting in mid-1990s.  Past Medical History  Diagnosis Date  . Hypertension   . Gout   . Glaucoma    Osteoarthritis  Past Surgical History  Procedure Laterality Date  . Total hip arthroplasty    . Lumbar disc surgery      History   Social History  . Marital Status: Widowed    Spouse Name: N/A    Number of Children: N/A  . Years of Education: N/A   Occupational History  . Not on file.   Social History Main Topics  . Smoking status: Former Smoker -- 30 years    Quit date: 03/29/1986  . Smokeless tobacco: Not on file  . Alcohol Use: No  . Drug Use: No  . Sexual Activity:    Other Topics Concern  . Not on file   Social History Narrative  . No narrative on file    Family History  Problem Relation Age of Onset  . Diabetes Brother   . Coronary artery disease Brother   . Stroke Brother   . Diabetes Mother     No current facility-administered medications on file prior to encounter.   Current Outpatient Prescriptions on File Prior to Encounter  Medication Sig Dispense Refill  . allopurinol (ZYLOPRIM) 100 MG tablet Take by mouth daily.      Marland Kitchen amLODipine (NORVASC) 5 MG tablet Take 5 mg by mouth daily.        Allergies  Allergen Reactions  . Penicillins     unknown    REVIEW OF SYSTEMS:  (Positives checked otherwise  negative)  CARDIOVASCULAR:   chest pain,  chest pressure,  palpitations,  shortness of breath when laying flat,  shortness of breath with exertion,   pain in feet when walking,  pain in feet when laying flat,  history of blood clot in veins (DVT),  history of phlebitis,  swelling in legs,  varicose veins  PULMONARY:   productive cough,  asthma,  wheezing  NEUROLOGIC:   weakness in arms or legs,  numbness in arms or legs,  difficulty speaking or slurred speech,  temporary loss of vision in one eye,  dizziness  HEMATOLOGIC:   bleeding problems,  problems with blood clotting too easily  MUSCULOSKEL:   joint pain,  joint swelling  GASTROINTEST:   vomiting blood,  blood in stool,  no abd pain     GENITOURINARY:   burning with urination,  blood in urine  PSYCHIATRIC:   history of major depression  INTEGUMENTARY:   rashes,  ulcers  CONSTITUTIONAL:   fever,  chills  For VQI Use Only  PRE-ADM LIVING: Home  AMB STATUS: Ambulatory  CAD Sx: None  PRIOR CHF: None  STRESS TEST:  No,  Normal,  + ischemia,  + MI,  Both  Physical Examination  Filed Vitals:   09/29/2013 1546 10/19/2013 1619 10/26/2013 1630 10/27/2013 1720  BP: 140/86 137/77  145/93  Pulse: 69 64 71 67  Temp: 98.2 F (36.8 C)     TempSrc: Oral     Resp: Weight: 151 lb 4 oz (68.607 kg)     SpO2: 92% 95% 96% 93%   There is no height on file to calculate BMI.  General: A&O x 3, WDWN  Head: [place]/AT  Ear/Nose/Throat: Hearing grossly intact, nares w/o erythema or drainage, oropharynx w/o Erythema/Exudate  Eyes: PERRLA, EOMI  Neck: Supple, no nuchal rigidity, no palpable LAD  Pulmonary: Sym exp, good air movt, CTAB, no rales, rhonchi, & wheezing  Cardiac: RRR, Nl S1, S2, no rubs or gallops, +Murmur  Vascular: Vessel Right Left  Radial Faintly Palpable Faintly Palpable  Brachial Faintly Palpable Faintly Palpable  Carotid  Palpable, without bruit Palpable, with transmitted murmur  Aorta Palpable AAA N/A  Femoral Faintly Palpable Faintly Palpable  Popliteal Not palpable Not palpable  PT Faintly Palpable Not Palpable  DP Faintly Palpable Not Palpable   Gastrointestinal: soft, NTND, -G/R, - HSM, - masses, mild L CVAT, non-tender AAA  Musculoskeletal: M/S 5/5 throughout , Extremities without ischemic changes   Neurologic: CN 2-12 intact , Pain and light touch intact in extremities , Motor exam as listed above  Psychiatric: Judgment intact, Mood & affect appropriate for pt's clinical situation  Dermatologic: See M/S exam for extremity exam, no rashes otherwise noted  Lymph : No Cervical, Axillary, or Inguinal lymphadenopathy   Non-Invasive Vascular Imaging  Radiology: US Abdomen Complete  10/11/2013   ADDENDUM REPORT: 10/12/2013 12:47  ADDENDUM: Findings and recommendations were discussed with Dr. Shana Chute at 12:40 p.m. As patient has no abdominal pain, the findings are likely stable/chronic. Further evaluation with CT was recommended on an urgent, but not emergent basis.   Electronically Signed   By: Elberta Fortis M.D.   On: 10/13/2013 12:47   09/30/2013   CLINICAL DATA:  Acute abdominal pain.  EXAM: ULTRASOUND ABDOMEN COMPLETE  COMPARISON:  None.  FINDINGS: Exam somewhat limited due to patient body habitus and abundant overlying bowel gas.  Gallbladder:  No gallstones or wall thickening visualized. No sonographic Murphy sign noted.  Common bile duct:  Diameter: 4 mm.  Liver:  No focal lesion identified. Within normal limits in parenchymal echogenicity.  IVC:  Not visualized.  Pancreas:  Visualized portion unremarkable.  Spleen:  Size and appearance within normal limits.  Right Kidney:  Length: 8.6 cm. Echogenicity within normal limits. No mass or hydronephrosis visualized.  Left Kidney:  Length: 8.7 cm. Echogenicity within normal limits. No mass or hydronephrosis visualized.  Abdominal aorta:  Proximal segment not  visualized. Moderate atherosclerotic disease with significant aneurysmal dilatation of the mid to distal abdominal aorta measuring approximately 7.8 x 8.1 cm in its AP and transverse dimensions and extending approximately 10.8 cm in length. There is moderate mural thrombus as the lumen measures approximately 2.2 x 3.2 cm in AP and transverse dimension.  Other findings:  None.  IMPRESSION: Significant aneurysm of the mid to distal abdominal aorta measuring approximately 7.8 x 8.1 cm in its AP and transverse dimensions and extending 10.8 cm in length. Moderate mural thrombus as the lumen measures 2.2 x 3.2 cm. Recommend CT abdomen with and without contrast to exclude contained rupture in this patient with abdominal pain.  These results were called by telephone at the time of interpretation on 10/08/2013 at  11:11 am to Dr. Kevin Fenton SPRUILL's recepeptionist, Frederick Peers, , who verbally acknowledged these results. We are unable to reach Dr. Shana Chute at this time. I therefore called patient directly as patient states he has no known history of abdominal aneurysm and states he has no abdominal pain. We discussed that he needs further evaluation with CT scan and that Dr. Magda Kiel office will be contacting him to scheduled the exam. We discussed that he should proceed to the nearest ER if he experiences abdominal pain.  Electronically Signed: By: Elberta Fortis M.D. On: 10/18/2013 11:26    Medical Decision Making  The patient is a 80 y.o. male who presents with: chronic abdominal pain , large AAA.   I don't think the abdominal pain is due to a sx AAA at this point.  Regardless, he is going to need to be repaired as his annual rupture rate for a 8 cm aneurysm is 30-50%.  Based on this patient's exam and diagnostic studies, he needs CTA Abd/pelvis.  The repair technique OAR vs EVAR will depend on the anatomy present on CTA.  Thank you for allowing [place] to participate in this patient's care.  [name],  MD [facility] Office: [phone] Pager: [phone]  10/08/2013, 5:41 PM   Addendum  CTA Abd/pelvis (10/13/2013)  1. 8.5 cm infrarenal abdominal aortic aneurysm extending across the bifurcation to involve the proximal left common iliac artery. No evidence of rupture or impending rupture. However, the size presents will a significant risk of rupture, and vascular surgical consultation is recommended.  2. Tortuous right iliac arterial system without aneurysm or stenosis.  3. Origin occlusion of the left SFA.  4. 17 mm exophytic left renal mass, possibly hyperdense cyst but incompletely characterized.  5. Colonic diverticulosis.  Based on my review of this patient's CTA, he has a large infrarenal AAA without any evidence of rupture.  While he does have diverticulosis, I don't see anything to account for his abdominal pain.  He has >3 cm of aortic neck with tortuous iliac arteries (L>R).  The L side appears more amendable for main body delivery.  The distal aortic taper might cause some difficulties with cannulation of contralateral limb if the contralateral gate opens more distally, so a shorter body would be better in this case.  I suspect the right iliac artery can be navigated after placing a stiff wire.    - I suspect this patient's anatomy is compatible with EVAR.  My Emeline Darling rep will review the films in the AM. - Given the size of the AAA, this patient will need repair unless expectant management is elected.  This patient has elected repair even if OAR is necessary. - Admit to 3S for BP control, close observation, IV fluid resuscitation to decrease risk of CIN - Hybrid OR is booked for the entirety of tomorrow so will aim for Wednesday  - Cardiology evaluation for optimization and evaluation of reported new murmur  [name], MD [facility] [place] Office: [phone] Pager: [phone]  10/11/2013, 7:17 PM

## 2013-10-30 DIAGNOSIS — I359 Nonrheumatic aortic valve disorder, unspecified: Secondary | ICD-10-CM

## 2013-10-30 DIAGNOSIS — I714 Abdominal aortic aneurysm, without rupture, unspecified: Secondary | ICD-10-CM

## 2013-10-30 LAB — URINALYSIS, ROUTINE W REFLEX MICROSCOPIC
Bilirubin Urine: NEGATIVE
Glucose, UA: NEGATIVE mg/dL
KETONES UR: NEGATIVE mg/dL
Leukocytes, UA: NEGATIVE
Nitrite: NEGATIVE
PROTEIN: NEGATIVE mg/dL
Specific Gravity, Urine: 1.022 (ref 1.005–1.030)
Urobilinogen, UA: 0.2 mg/dL (ref 0.0–1.0)
pH: 7 (ref 5.0–8.0)

## 2013-10-30 LAB — SURGICAL PCR SCREEN
MRSA, PCR: NEGATIVE
Staphylococcus aureus: NEGATIVE

## 2013-10-30 LAB — URINE MICROSCOPIC-ADD ON

## 2013-10-30 MED ORDER — SODIUM CHLORIDE 0.9 % IJ SOLN
3.0000 mL | Freq: Two times a day (BID) | INTRAMUSCULAR | Status: DC
  Administered 2013-10-30 – 2013-11-01 (×4): 3 mL via INTRAVENOUS

## 2013-10-30 MED ORDER — SODIUM CHLORIDE 0.9 % IJ SOLN: 3.0000 mL | INTRAMUSCULAR | Status: DC | PRN

## 2013-10-30 MED ORDER — SODIUM CHLORIDE 0.9 % IV SOLN: 250.0000 mL | INTRAVENOUS | Status: DC | PRN

## 2013-10-30 MED ORDER — ASPIRIN 81 MG PO CHEW
81.0000 mg | CHEWABLE_TABLET | ORAL | Status: AC
  Administered 2013-10-31: 81 mg via ORAL
  Filled 2013-10-30: qty 1

## 2013-10-30 MED ORDER — ENSURE COMPLETE PO LIQD
237.0000 mL | Freq: Two times a day (BID) | ORAL | Status: DC
  Administered 2013-10-30 – 2013-11-05 (×3): 237 mL via ORAL

## 2013-10-30 MED ORDER — VANCOMYCIN HCL IN DEXTROSE 1-5 GM/200ML-% IV SOLN
1000.0000 mg | INTRAVENOUS | Status: DC
  Filled 2013-10-30: qty 200

## 2013-10-30 NOTE — Progress Notes (Signed)
Utilization review completed.  

## 2013-10-30 NOTE — Progress Notes (Addendum)
Subjective  - Doing fine no new complaints.     Objective 132/77 68 97.9 F (36.6 C) (Oral) 10 93%  Intake/Output Summary (Last 24 hours) at 10/30/13 0723 Last data filed at 10/30/13 0400  Gross per 24 hour  Intake    240 ml  Output   1200 ml  Net   -960 ml    GI soft, AAA non tender Bilateral LE warm, grossly sensation and motor intact.    Assessment: Chronic abdominal pain , large AAA. Plan: EVAR repair of AAA by Dr. Imogene Burn 11/06/2013 Cardiac consult: evaluation for optimization and evaluation of reported new murmur.  NPO past MN, out of bed as tolerates PLT 92 will hold Lovenox.  Pending New labs in the am   Clinton Gallant [facility] 10/30/2013 7:23 AM --  Laboratory Lab Results:  Recent Labs  09/30/2013 1636 10/12/2013 1722  WBC 6.5  --   HGB 14.2 15.0  HCT 42.1 44.0  PLT 92*  --    BMET  Recent Labs  09/29/2013 1636 10/04/2013 1722  NA 138 139  K 4.0 3.8  CL 102 107  CO2 22  --   GLUCOSE 91 91  BUN 14 15  CREATININE 0.92 1.00  CALCIUM 9.0  --     COAG No results found for this basename: INR, PROTIME   No results found for this basename: PTT   Addendum  I have independently interviewed and examined the patient, and I agree with the physician assistant's findings.  No pain currently.  Exam unchg.  Cardio c/s for new murmur.  ABI pending.  CTA will be reviewed later today for EVAR vs OAR with [employer] rep.  Distal aorta might be a problem if the contralateral gate is occluded by the tight distal aorta.  [name], MD [facility] [place] Office: [phone] Pager: [phone]  10/30/2013, 8:00 AM   Addendum  Echocardiogram (10/10/2013) - Left ventricle: The cavity size was normal. There was mild concentric hypertrophy. Systolic function was normal. The estimated ejection fraction was in the range of 55% to 60%. Wall motion was normal; there were no regional wall motion abnormalities. Doppler parameters are consistent with  abnormal left ventricular relaxation (grade 1 diastolic dysfunction).  - Aortic valve: There was critical stenosis. There was trivial regurgitation. Valve area (VTI): 0.36 cm^2. Valve area (Vmax): 0.45 cm^2. Valve area (Vmean): 0.4 cm^2. - Mitral valve: Calcified annulus. Severely thickened, severely calcified leaflets posterior. The findings are consistent withmoderate stenosis. Valve area by continuity equation (using LVOT flow): 1.29 cm^2. - Atrial septum: No defect or patent foramen ovale was identified.  Per discussion with Dr. Eden Emms, better to delay the repair to get a full work-up on this patient given the critical AS and history of some chest pain.   Pt's AS might be bad enough to risk his cardiac risk to a prohibitive level.  Essentially he is not a candidate for an open aortic repair (OAR).  He is on for a cardiac cath tomorrow.    Fortunately, his abdominal pain is gone today, so immediate repair is not necessary.   I wonder how much of his abdominal pain is actually cardiac in nature.  I suspect the cardiac work-up will delay things 2-3 days, so realistically attempt at EVAR (possible under regional or local anesthesia) will need to be rescheduled until 8th September at the earliest.     [name], MD [facility] [place] Office: [phone] Pager: [phone]  10/30/2013, 10:52 AM

## 2013-10-30 NOTE — Progress Notes (Signed)
INITIAL NUTRITION ASSESSMENT  DOCUMENTATION CODES Per approved criteria  -Severe malnutrition in the context of acute illness or injury   INTERVENTION:  Ensure Complete po BID, each supplement provides 350 kcal and 13 grams of protein  NUTRITION DIAGNOSIS: Malnutrition related to inadequate oral intake as evidenced by moderate depletion of muscle mass and 7% weight loss in 1 month.   Goal: Intake to meet >90% of estimated nutrition needs.  Monitor:  PO intake, labs, weight trend.  Reason for Assessment: MST  80 y.o. male  Admitting Dx: Abdominal Pain; AAA  ASSESSMENT: 80 yo patient sent to hospital by Dr Shana Chute for murmur and palpable abdominal mass. Patient found to have 8cm AAA.   Plans for diagnostic cath to r/o severe CAD.  Patient reports that he has lost weight over the past month due to poor appetite with abdominal pain. Since admission, he has been eating 100% of meals.  Nutrition Focused Physical Exam:  Subcutaneous Fat:  Orbital Region: WNL Upper Arm Region: WNL Thoracic and Lumbar Region: WNL  Muscle:  Temple Region: mild depletion Clavicle Bone Region: mild depletion Clavicle and Acromion Bone Region: mild depletion Scapular Bone Region: WNL Dorsal Hand: WNL Patellar Region: mild depletion Anterior Thigh Region: moderate depletion Posterior Calf Region: moderate depletion  Edema: none  Pt meets criteria for severe MALNUTRITION in the context of acute illness as evidenced by moderate depletion of muscle mass and 7% weight loss in the past month.  Height: Ht Readings from Last 1 Encounters:  10/26/2013  (1.778 m)    Weight: Wt Readings from Last 1 Encounters:  10/19/2013 161 lb 6 oz (73.2 kg)    Ideal Body Weight: 75.5 kg  % Ideal Body Weight: 97%  Wt Readings from Last 10 Encounters:  10/18/2013 161 lb 6 oz (73.2 kg)    Usual Body Weight: 173 lb per patient  % Usual Body Weight: 93%  BMI:  Body mass index is 23.16  kg/(m^2).  Estimated Nutritional Needs: Kcal: 1800-2000 Protein: 90-110 gm Fluid: 2 L  Skin: WDL  Diet Order: Cardiac  EDUCATION NEEDS: -Education needs addressed   Intake/Output Summary (Last 24 hours) at 10/30/13 0922 Last data filed at 10/30/13 0800  Gross per 24 hour  Intake    390 ml  Output   1725 ml  Net  -1335 ml    Last BM: 8/29   Labs:   Recent Labs Lab 10/25/2013 1636 10/22/2013 1722  NA 138 139  K 4.0 3.8  CL 102 107  CO2 22  --   BUN 14 15  CREATININE 0.92 1.00  CALCIUM 9.0  --   GLUCOSE 91 91    CBG (last 3)  No results found for this basename: GLUCAP,  in the last 72 hours  Scheduled Meds: . sodium chloride   Intravenous Once  . allopurinol  100 mg Oral Daily  . amLODipine  5 mg Oral Daily  . docusate sodium  100 mg Oral BID  . dorzolamide  1 drop Both Eyes BID  . pantoprazole  40 mg Oral Daily  . [START ON Nov 20, 2013] vancomycin  1,000 mg Intravenous To OR    Continuous Infusions: . sodium chloride 75 mL/hr at 10/30/13 0800    Past Medical History  Diagnosis Date  . Hypertension   . Gout   . Glaucoma     Past Surgical History  Procedure Laterality Date  . Total hip arthroplasty    . Lumbar disc surgery  Molli Barrows, RD, LDN, [place] Pager [phone] After Hours Pager [phone]

## 2013-10-30 NOTE — Consult Note (Signed)
CARDIOLOGY CONSULT NOTE       Mathews ID: Shawn Mathews MRN: [id number] DOB/AGE: 78-Mar-1929 78 y.o.  Admit date: 11/12/13 Referring Physician:  Imogene Burn Primary Physician: Pola Corn, MD Primary Cardiologist:  [place] Reason for Consultation:  AS  Active Problems:   AAA (abdominal aortic aneurysm) without rupture   HPI:   Shawn Mathews sent to hospital by Dr Shana Chute for murmur and palpable abdominal mass.  Mathews found to have 8cm AAA.  Plans for EVAR in am [place] echo showed critical AS with mean gradient of 117 mmHg.  I reviewed echo and signal is real with peak velocities in the 54m/sec range and not an MR signal EF is normal  He has no history of cardiac disease or CAD.  Has not had major operation since 89 when he had hips done.  He still drives  On close questioning he appears to have some exertional chest pain and dyspnea both over the last 2 months  No syncope.  He denies abdominal pain.  Has indigestion with some foods.  Nonsmoker with no chronic lung disease and no bleeding issues  ROS All other systems reviewed and negative except as noted above  Past Medical History  Diagnosis Date  . Hypertension   . Gout   . Glaucoma     Family History  Problem Relation Age of Onset  . Diabetes Brother   . Coronary artery disease Brother   . Stroke Brother   . Diabetes Mother     History   Social History  . Marital Status: Widowed    Spouse Name: N/A    Number of Children: N/A  . Years of Education: N/A   Occupational History  . Not on file.   Social History Main Topics  . Smoking status: Former Smoker -- 30 years    Quit date: 03/29/1986  . Smokeless tobacco: Not on file  . Alcohol Use: No  . Drug Use: No  . Sexual Activity:    Other Topics Concern  . Not on file   Social History Narrative  . No narrative on file    Past Surgical History  Procedure Laterality Date  . Total hip arthroplasty    . Lumbar disc surgery       . sodium chloride    Intravenous Once  . allopurinol  100 mg Oral Daily  . amLODipine  5 mg Oral Daily  . docusate sodium  100 mg Oral BID  . dorzolamide  1 drop Both Eyes BID  . pantoprazole  40 mg Oral Daily  . [START ON 11/16/2013] vancomycin  1,000 mg Intravenous To OR   . sodium chloride 75 mL/hr at 10/30/13 0800    Physical Exam: Blood pressure 136/83, pulse 66, temperature 98.3 F (36.8 C), temperature source Oral, resp. rate 25, height  (1.778 m), weight 161 lb 6 oz (73.2 kg), SpO2 93.00%.    Affect appropriate Elderly black male  HEENT: normal Neck supple with no adenopathy JVP normal no bruits no thyromegaly Lungs clear with no wheezing and good diaphragmatic motion Heart:  S1/S2 absent AS murmur late peaking , no rub, gallop or click PMI normal Abdomen: benighn, BS positve, no tenderness, AAA palpable  no bruit.  No HSM or HJR Distal pulses intact with no bruits No edema Neuro non-focal Skin warm and dry No muscular weakness   Labs:   Lab Results  Component Value Date   WBC 6.5 12-Nov-2013   HGB 15.0 Nov 12, 2013  HCT 44.0 10/20/2013   MCV 95.2 10/28/2013   PLT 92* 10/08/2013    Recent Labs Lab 10/02/2013 1636 10/02/2013 1722  NA 138 139  K 4.0 3.8  CL 102 107  CO2 22  --   BUN 14 15  CREATININE 0.92 1.00  CALCIUM 9.0  --   PROT 6.5  --   BILITOT 0.6  --   ALKPHOS 54  --   ALT 10  --   AST 28  --   GLUCOSE 91 91   Lab Results  Component Value Date   TROPONINI <0.30 10/06/2013    No results found for this basename: CHOL   No results found for this basename: HDL   No results found for this basename: LDLCALC   No results found for this basename: TRIG   No results found for this basename: CHOLHDL   No results found for this basename: LDLDIRECT      Radiology: Dg Chest 2 View  10/30/2013   CLINICAL DATA:  pre op  EXAM: CHEST - 2 VIEW  COMPARISON:  04/15/2010  FINDINGS: Somewhat attenuated peripheral bronchovascular markings and coarse perihilar markings. No  focal infiltrate or overt edema. Heart size normal. No effusion. Spondylitic changes at several contiguous levels in the mid thoracic spine.  IMPRESSION: 1. Stable chronic interstitial changes.  No acute disease.   Electronically Signed   By: Oley Balm M.D.   On: 10/30/2013 01:47   US Abdomen Complete  10/02/2013   ADDENDUM REPORT: 10/20/2013 12:47  ADDENDUM: Findings and recommendations were discussed with Dr. Shana Chute at 12:40 p.m. As Mathews has no abdominal pain, the findings are likely stable/chronic. Further evaluation with CT was recommended on an urgent, but not emergent basis.   Electronically Signed   By: Elberta Fortis M.D.   On: 10/18/2013 12:47   10/14/2013   CLINICAL DATA:  Acute abdominal pain.  EXAM: ULTRASOUND ABDOMEN COMPLETE  COMPARISON:  None.  FINDINGS: Exam somewhat limited due to Mathews body habitus and abundant overlying bowel gas.  Gallbladder:  No gallstones or wall thickening visualized. No sonographic Murphy sign noted.  Common bile duct:  Diameter: 4 mm.  Liver:  No focal lesion identified. Within normal limits in parenchymal echogenicity.  IVC:  Not visualized.  Pancreas:  Visualized portion unremarkable.  Spleen:  Size and appearance within normal limits.  Right Kidney:  Length: 8.6 cm. Echogenicity within normal limits. No mass or hydronephrosis visualized.  Left Kidney:  Length: 8.7 cm. Echogenicity within normal limits. No mass or hydronephrosis visualized.  Abdominal aorta:  Proximal segment not visualized. Moderate atherosclerotic disease with significant aneurysmal dilatation of the mid to distal abdominal aorta measuring approximately 7.8 x 8.1 cm in its AP and transverse dimensions and extending approximately 10.8 cm in length. There is moderate mural thrombus as the lumen measures approximately 2.2 x 3.2 cm in AP and transverse dimension.  Other findings:  None.  IMPRESSION: Significant aneurysm of the mid to distal abdominal aorta measuring approximately 7.8 x 8.1 cm  in its AP and transverse dimensions and extending 10.8 cm in length. Moderate mural thrombus as the lumen measures 2.2 x 3.2 cm. Recommend CT abdomen with and without contrast to exclude contained rupture in this Mathews with abdominal pain.  These results were called by telephone at the time of interpretation on 10/12/2013 at 11:11 am to Dr. Kevin Fenton [facility]'s recepeptionist, Frederick Peers, , who verbally acknowledged these results. We are unable to reach Dr. Shana Chute at this time. I therefore  called Mathews directly as Mathews states he has no known history of abdominal aneurysm and states he has no abdominal pain. We discussed that he needs further evaluation with CT scan and that Dr. Magda Kiel office will be contacting him to scheduled the exam. We discussed that he should proceed to the nearest ER if he experiences abdominal pain.  Electronically Signed: By: Elberta Fortis M.D. On: 10/02/2013 11:26   Ct Cta Abd/pel W/cm &/or W/o Cm  10/24/2013   CLINICAL DATA:  ABDOMINAL PAIN, AAA  EXAM: CT ANGIOGRAPHY ABDOMEN AND PELVIS  TECHNIQUE: Multidetector CT imaging of the abdomen and pelvis was performed using the standard protocol during bolus administration of intravenous contrast. Multiplanar reconstructed images including MIPs were obtained and reviewed to evaluate the vascular anatomy.  CONTRAST:  OMNIPAQUE IOHEXOL 350 MG/ML SOLN  COMPARISON:  Ultrasound from earlier the same day  FINDINGS: ARTERIAL FINDINGS:  Coronary calcifications.  Aorta: Mild atheromatous plaque in the ectatic and torturous mildly tortuous visualized distal descending thoracic aorta. There is more extensive irregular partially calcified plaque in the juxtarenal aorta. There is a fusiform infrarenal aneurysm measuring 8.4 x 8.5 cm maximum transverse diameter, tapering to a diameter of 4.7 cm at the bifurcation. There is a large amount of nonocclusive mural thrombus. No adjacent inflammatory/edematous changes. No retroperitoneal hematoma.   Celiac axis: There is an eccentric origin diverticulum which measures approximately 10 mm diameter. There is mild short-segment narrowing over approximately 1 cm at the level of the median arcuate ligament of the diaphragm, patent distally.  Superior mesenteric: Calcified ostial plaque resulting in short segment mild stenosis of doubtful hemodynamic significance. Patent distally with classic branch anatomy.  Left renal:           Single, patent  Right renal: Single. There is eccentric partially calcified plaque extending from the origin over a length of approximately 2 cm resulting in mild stenosis. Patent distally.  Inferior mesenteric: Short-segment origin occlusion, reconstituted distally by visceral collaterals.  Left iliac: The origin of the common iliac is involved by the aneurysm, measuring 2 cm diameter, tapering to a diameter of 12 mm at the common iliac bifurcation. Internal iliac is tortuous and atheromatous. There is mild tortuosity of the external iliac artery with some scattered plaque, no stenosis or aneurysm. There is origin occlusion of the SFA, distal extent not included on the study.  Right iliac: Ectatic common iliac artery, 14 mm diameter at its origin, tapering to 13 mm. There is scattered plaque in the internal and external iliac arteries without high-grade stenosis or aneurysm. Marked tortuosity of the external iliac artery may be problematic for percutaneous access.  Venous findings:      Dedicated venous phase imaging not obtained.  Review of the MIP images confirms the above findings.  Nonvascular findings: Moderately advanced emphysematous changes in the visualized lung bases with some dependent atelectasis posteriorly. Unremarkable arterial phase evaluation of liver, spleen, adrenal glands, pancreas, right kidney. There is a 17 mm low-attenuation lesion exophytic from the mid portion of the left kidney medial to the collecting system, measuring above simple fluid attenuation. The  stomach, small bowel, and colon are nondilated. Normal appendix. Scattered colonic diverticula most numerous in the sigmoid segment, without adjacent inflammatory/ edematous change. Bilateral hip arthroplasty hardware results in streak artifact degrading portions of the scan. Urinary bladder is physiologically distended. No ascites. No free air. No adenopathy. Degenerative disc disease in the lumbar spine most marked L4-5 and L5-S1.  IMPRESSION: 1. 8.5 cm infrarenal abdominal  aortic aneurysm extending across the bifurcation to involve the proximal left common iliac artery. No evidence of rupture or impending rupture. However, the size presents will a significant risk of rupture, and vascular surgical consultation is recommended. 2. Tortuous right iliac arterial system without aneurysm or stenosis. 3. Origin occlusion of the left SFA. 4. 17 mm exophytic left renal mass, possibly hyperdense cyst but incompletely characterized. 5. Colonic diverticulosis.   Electronically Signed   By: Oley Balm M.D.   On: 10/25/2013 19:07    EKG:  NSR  Nonspecific T wave changes   ASSESSMENT AND PLAN:  Aortic Stenosis:  From a preop standpoint need to cancel procedure scheduled for tomorrow.  Discussed this with Dr Imogene Burn.  AS is not just severe.  I have rarely seen a mean gradient over 100 mmHg.  Will plan diagnositic cath from radial approach in am to r/o severe CAD.  If he has severe CAD he may end up as palliative care.  If no CAD a multidisciplinary approach will be needed.  Discussed case with Dr [name].  After cath will discuss with Dr Cornelius Moras.  A possible combined TAVR/EVAR approach may be possible.  Would also involve cardiac anesthesia.  Mathews may elect to have EVAR ? Under spinal or local anesthesia knowing there is no bale out for open procedure if something goes wrong.  He should not have general anesthesia as it stands at this point  Discussed issues with Mathews at length.  Will schedule cath with Dr [name] in  am  Orders written cath lab called.  Will then discuss with VVS, CVTS, Anesthesia and Cardiology any possible approach once we know if coronary disease is present or not  Signed: Charlton Haws 10/30/2013, 11:01 AM

## 2013-10-30 DEATH — deceased

## 2013-10-31 ENCOUNTER — Encounter (HOSPITAL_COMMUNITY): Payer: Self-pay | Admitting: Cardiovascular Disease

## 2013-10-31 ENCOUNTER — Encounter (HOSPITAL_COMMUNITY): Admission: EM | Disposition: E | Payer: Self-pay | Source: Home / Self Care | Attending: Vascular Surgery

## 2013-10-31 ENCOUNTER — Other Ambulatory Visit: Payer: Self-pay

## 2013-10-31 DIAGNOSIS — Z01810 Encounter for preprocedural cardiovascular examination: Secondary | ICD-10-CM

## 2013-10-31 DIAGNOSIS — I2510 Atherosclerotic heart disease of native coronary artery without angina pectoris: Secondary | ICD-10-CM

## 2013-10-31 DIAGNOSIS — I359 Nonrheumatic aortic valve disorder, unspecified: Secondary | ICD-10-CM

## 2013-10-31 DIAGNOSIS — I739 Peripheral vascular disease, unspecified: Secondary | ICD-10-CM

## 2013-10-31 DIAGNOSIS — I714 Abdominal aortic aneurysm, without rupture, unspecified: Secondary | ICD-10-CM

## 2013-10-31 DIAGNOSIS — E43 Unspecified severe protein-calorie malnutrition: Secondary | ICD-10-CM | POA: Insufficient documentation

## 2013-10-31 DIAGNOSIS — I350 Nonrheumatic aortic (valve) stenosis: Secondary | ICD-10-CM | POA: Diagnosis present

## 2013-10-31 HISTORY — PX: LEFT HEART CATHETERIZATION WITH CORONARY ANGIOGRAM: SHX5451

## 2013-10-31 LAB — BASIC METABOLIC PANEL
Anion gap: 11 (ref 5–15)
BUN: 13 mg/dL (ref 6–23)
CO2: 23 meq/L (ref 19–32)
CREATININE: 0.95 mg/dL (ref 0.50–1.35)
Calcium: 7.8 mg/dL — ABNORMAL LOW (ref 8.4–10.5)
Chloride: 109 mEq/L (ref 96–112)
GFR calc non Af Amer: 74 mL/min — ABNORMAL LOW (ref >90)
GFR, EST AFRICAN AMERICAN: 86 mL/min — AB (ref >90)
Glucose, Bld: 93 mg/dL (ref 70–99)
Potassium: 3.6 mEq/L — ABNORMAL LOW (ref 3.7–5.3)
Sodium: 143 mEq/L (ref 137–147)

## 2013-10-31 LAB — CBC
HEMATOCRIT: 37.1 % — AB (ref 39.0–52.0)
HEMOGLOBIN: 12.4 g/dL — AB (ref 13.0–17.0)
MCH: 31.9 pg (ref 26.0–34.0)
MCHC: 33.4 g/dL (ref 30.0–36.0)
MCV: 95.4 fL (ref 78.0–100.0)
Platelets: 71 10*3/uL — ABNORMAL LOW (ref 150–400)
RBC: 3.89 MIL/uL — ABNORMAL LOW (ref 4.22–5.81)
RDW: 16 % — ABNORMAL HIGH (ref 11.5–15.5)
WBC: 5.5 10*3/uL (ref 4.0–10.5)

## 2013-10-31 LAB — PROTIME-INR
INR: 1.42 (ref 0.00–1.49)
Prothrombin Time: 17.4 seconds — ABNORMAL HIGH (ref 11.6–15.2)

## 2013-10-31 SURGERY — LEFT HEART CATHETERIZATION WITH CORONARY ANGIOGRAM
Anesthesia: LOCAL

## 2013-10-31 MED ORDER — MIDAZOLAM HCL 2 MG/2ML IJ SOLN
INTRAMUSCULAR | Status: AC
  Filled 2013-10-31: qty 2

## 2013-10-31 MED ORDER — FENTANYL CITRATE 0.05 MG/ML IJ SOLN
INTRAMUSCULAR | Status: AC
  Filled 2013-10-31: qty 2

## 2013-10-31 MED ORDER — SODIUM CHLORIDE 0.9 % IJ SOLN
3.0000 mL | Freq: Two times a day (BID) | INTRAMUSCULAR | Status: DC
  Administered 2013-11-01: 3 mL via INTRAVENOUS

## 2013-10-31 MED ORDER — LIDOCAINE HCL (PF) 1 % IJ SOLN
INTRAMUSCULAR | Status: AC
  Filled 2013-10-31: qty 30

## 2013-10-31 MED ORDER — SODIUM CHLORIDE 0.9 % IV SOLN
1.0000 mL/kg/h | INTRAVENOUS | Status: AC
  Administered 2013-10-31: 1 mL/kg/h via INTRAVENOUS

## 2013-10-31 MED ORDER — SODIUM CHLORIDE 0.9 % IJ SOLN: 3.0000 mL | INTRAMUSCULAR | Status: DC | PRN

## 2013-10-31 MED ORDER — HEPARIN (PORCINE) IN NACL 2-0.9 UNIT/ML-% IJ SOLN
INTRAMUSCULAR | Status: AC
  Filled 2013-10-31: qty 1000

## 2013-10-31 MED ORDER — SODIUM CHLORIDE 0.9 % IV SOLN
250.0000 mL | INTRAVENOUS | Status: DC | PRN
  Administered 2013-11-16: 250 mL via INTRAVENOUS
  Administered 2013-11-17: 10 mL/h via INTRAVENOUS
  Administered 2013-11-17 – 2013-11-21 (×3): 250 mL via INTRAVENOUS

## 2013-10-31 MED ORDER — HEPARIN SODIUM (PORCINE) 1000 UNIT/ML IJ SOLN
INTRAMUSCULAR | Status: AC
  Filled 2013-10-31: qty 1

## 2013-10-31 MED ORDER — NITROGLYCERIN 1 MG/10 ML FOR IR/CATH LAB
INTRA_ARTERIAL | Status: AC
  Filled 2013-10-31: qty 10

## 2013-10-31 NOTE — CV Procedure (Signed)
    Cardiac Catheterization Procedure Note  Name: Shawn Mathews MRN: [id number] DOB: May 01, 1927  Procedure: Catheter placement for angiography, Selective Coronary Angiography  Indication: Critical aortic stenosis   Procedural Details: The right wrist was prepped, draped, and anesthetized with 1% lidocaine. We initially planned on performing a right and left heart catheterization from the arm. However, despite multiple attempts we are unable to obtain antecubital venous access. The patient has thrombocytopenia and his echo demonstrated no evidence of pulmonary hypertension or RV dysfunction. Therefore, I elected not to perform right heart catheterization from femoral or IJ access. Using the modified Seldinger technique, a 5/6 French Slender sheath was introduced into the right radial artery. 3 mg of verapamil was administered through the sheath, weight-based unfractionated heparin was administered intravenously. Standard Judkins catheters were used for selective coronary angiography. Catheter exchanges were performed over an exchange length guidewire. There were no immediate procedural complications. A TR band was used for radial hemostasis at the completion of the procedure.  The patient was transferred to the post catheterization recovery area for further monitoring.  Procedural Findings: Hemodynamics: AO 125/70 with a mean of 92  Coronary angiography: Coronary dominance: right  Left mainstem: The left main is patent. There is no obstruction noted. The vessel divides into the LAD and left circumflex   Left anterior descending (LAD): The LAD is a large-caliber vessel. There is mild calcification present. The proximal LAD is widely patent. The mid vessel has 30-40% stenosis. The diagonal branches are patent. The diagonals all have moderate ostial stenoses in the range of 50-75%. The distal LAD is widely patent  Left circumflex (LCx): The left circumflex is a large-caliber vessel. There is some  tortuosity proximally with associated calcification. In the proximal vessel, there is a hypodense 30-40% stenosis. The OM branches are patent without significant obstruction.  Right coronary artery (RCA): The RCA is a large, dominant vessel. There is an ulcerated-appearing nonobstructive plaque proximally with no more than 30% associated stenosis. The mid vessel has irregularity without significant obstruction. The PDA and PLA branches are widely patent.  On plain fluoroscopy, the aortic valve is severely calcified with minimal aortic valve motion seen. The mitral annulus is also severely calcified.  Estimated Blood Loss: Minimal  Final Conclusions:   1. Severely calcified aortic valve with known critical aortic stenosis (valve not crossed) 2. Diffuse nonobstructive coronary artery disease as detailed above  Recommendations: Cardiac surgical evaluation, gated cardiac CTA and chest CT for further operative planning  [name] MD, [facility] 11/02/2013, 12:01 PM

## 2013-10-31 NOTE — Progress Notes (Addendum)
     Subjective  - Doing well, no new physical complaints.   Objective 128/68 65 97.6 F (36.4 C) (Axillary) 27 93%  Intake/Output Summary (Last 24 hours) at 11-11-2013 0736 Last data filed at Nov 11, 2013 0400  Gross per 24 hour  Intake   1395 ml  Output   3001 ml  Net  -1606 ml    GI soft, AAA non tender  Bilateral LE warm, grossly sensation and motor intact.   Assessment/Planning: AAA  CAD Pending further work from Cardiology  Possible plan for EVAR/TAVR?  Clinton Gallant [facility] 11/11/2013 7:36 AM --  Laboratory Lab Results:  Recent Labs  10/20/2013 1636 10/21/2013 1722 11-11-2013 0308  WBC 6.5  --  5.5  HGB 14.2 15.0 12.4*  HCT 42.1 44.0 37.1*  PLT 92*  --  71*   BMET  Recent Labs  10/14/2013 1636 10/21/2013 1722 11/11/13 0308  NA 138 139 143  K 4.0 3.8 3.6*  CL 102 107 109  CO2 22  --  23  GLUCOSE 91 91 93  BUN CREATININE 0.92 1.00 0.95  CALCIUM 9.0  --  7.8*    COAG Lab Results  Component Value Date   INR 1.42 11/11/13   No results found for this basename: PTT    Addendum  I have independently interviewed and examined the patient, and I agree with the physician assistant's findings.  Awaiting cardiac cath to finalize risk stratification.  Next step dependent on cardiac risk.  This patient is NOT a open surgical candidate given patient is NOT a general anesthesia candidate per CARDIOLOGY.  Will get Anesthesia to consult once all cardiology data is available.    Lab Results  Component Value Date   INR 1.42 2013-11-11   - hemodilution: admission H/H was consistent with hemoconcentration, doubt this patient is bleeding intra-abdominally - CIN: no evidence of such after >24 hours of hydration - Possible protein malnutrition: elevated INR may be tip off of underlying protein malnutrition, will draw labs today   [name], MD [facility] Office: [phone] Pager: [phone]  11/11/2013, 7:41 AM

## 2013-10-31 NOTE — Progress Notes (Signed)
Drained 1200 of clear yellow urine from foley bag.

## 2013-10-31 NOTE — Progress Notes (Signed)
TR BAND REMOVAL  LOCATION:    Right radial  DEFLATED PER PROTOCOL:    Yes   TIME BAND OFF / DRESSING APPLIED:    1600   SITE UPON ARRIVAL:    Level 0  SITE AFTER BAND REMOVAL:    Level 1 bruising  REVERSE ALLEN'S TEST:    +2 radial  pluse and pluse on right thumb is 93%  CIRCULATION SENSATION AND MOVEMENT:    Within Normal Limits    COMMENTS:   Pt had radial re-bleed and air re-inserted and deflation restarted over again after .  Hemostatis  Achieved.  Noted bruising at site.  MD aware. Area soft to touch.

## 2013-10-31 NOTE — Consult Note (Addendum)
INTERVENTIONAL CARDIOLOGY CONSULT NOTE  Patient ID: Shawn Mathews, MRN: [id number], DOB/AGE: [id number] 78 y.o. Admit date: 11-12-13 Date of Consult: 11/14/2013  Primary Physician: Pola Corn, MD Primary Cardiologist: Dr Shana Chute  Referring Physician: Dr Eden Emms  Chief Complaint: Shortness of breath Reason for Consultation: Severe/Critical Aortic Stenosis  HPI: This is an 80 year old gentleman who presented to the emergency department on August 31 because of a newly discovered large infrarenal abdominal aortic aneurysm. The patient has had mild chronic abdominal discomfort but this is not felt by vascular surgery to be representative of pain related to his AAA. He was noted to have a heart murmur and an echocardiogram demonstrated critical aortic stenosis with peak and mean gradients of 171 and 116 mm mercury, respectively. The patient was initially scheduled for endovascular abdominal aortic aneurysm repair, but this has been delayed because of his cardiac condition. I was asked to evaluate Shawn Mathews by Dr Eden Emms to consider treatment options for his critical AS.  The patient lives independently in [place]. He is a widower for approximately 12 years. He retired nearly 20 years ago. He is modestly active, but is limited by right hip pain and also shortness of breath. The patient still drives a car. He has help keeping up his house a few days per week, but otherwise remains independent. He reports a 2-3 year history of exertional dyspnea, slowly progressive. The patient is dyspneic with low-level activity such as walking a short distance on flat ground. He denies chest pain or pressure. He does have episodes of postural lightheadedness, but has not had frank syncope. He has had problems with right hip pain over the past 2-3 months. The patient has undergone 2 hip replacements on both sides. He now ambulates with a cane. He denies abdominal pain at present. He denies orthopnea, PND, or leg  swelling. He has been told of a heart murmur for several years, his murmur just recently worsened and he was not previously aware of this severely of his aortic stenosis.  Medical History:  Past Medical History  Diagnosis Date  . Hypertension   . Gout   . Glaucoma       Surgical History:  Past Surgical History  Procedure Laterality Date  . Total hip arthroplasty    . Lumbar disc surgery       Home Meds: Prior to Admission medications   Medication Sig Start Date End Date Taking? Authorizing Provider  allopurinol (ZYLOPRIM) 100 MG tablet Take by mouth daily.   Yes Historical Provider, MD  amLODipine (NORVASC) 5 MG tablet Take 5 mg by mouth daily.   Yes Historical Provider, MD  dorzolamide (TRUSOPT) 2 % ophthalmic solution Place 1 drop into both eyes 2 (two) times daily.  10/11/13  Yes Historical Provider, MD  traMADol (ULTRAM) 50 MG tablet Take 50 mg by mouth every 6 (six) hours as needed for moderate pain.  10/24/13  Yes Historical Provider, MD    Inpatient Medications:  . sodium chloride   Intravenous Once  . allopurinol  100 mg Oral Daily  . amLODipine  5 mg Oral Daily  . docusate sodium  100 mg Oral BID  . dorzolamide  1 drop Both Eyes BID  . feeding supplement (ENSURE COMPLETE)  237 mL Oral BID BM  . pantoprazole  40 mg Oral Daily  . sodium chloride  3 mL Intravenous Q12H  . vancomycin  1,000 mg Intravenous To OR   . sodium chloride 75 mL/hr at 11/10/2013 0037  Allergies:  Allergies  Allergen Reactions  . Penicillins     unknown    History   Social History  . Marital Status: Widowed    Spouse Name: N/A    Number of Children: N/A  . Years of Education: N/A   Occupational History  . Not on file.   Social History Main Topics  . Smoking status: Former Smoker -- 30 years    Quit date: 03/29/1986  . Smokeless tobacco: Not on file  . Alcohol Use: No  . Drug Use: No  . Sexual Activity:    Other Topics Concern  . Not on file   Social History Narrative  .  No narrative on file     Family History  Problem Relation Age of Onset  . Diabetes Brother   . Coronary artery disease Brother   . Stroke Brother   . Diabetes Mother    Review of Systems: General: negative for chills, fever, night sweats or weight changes.  ENT: negative for rhinorrhea or epistaxis Cardiovascular:  See history of present illness Dermatological: negative for rash Respiratory: negative for cough or wheezing GI: negative for nausea, vomiting, diarrhea, bright red blood per rectum, melena, or hematemesis GU: no hematuria, urgency, or frequency Neurologic: negative for visual changes, syncope, headache, or dizziness Heme: no easy bruising or bleeding Endo: negative for excessive thirst, thyroid disorder, or flushing Musculoskeletal: Positive for low back and bilateral hip pain right worse than left All other systems reviewed and are otherwise negative except as noted above.  Physical Exam: Blood pressure 125/80, pulse 71, temperature 97.9 F (36.6 C), temperature source Oral, resp. rate 27, height  (1.778 m), weight 161 lb 6 oz (73.2 kg), SpO2 95.00%. Pt is alert and oriented, WD, WN, in no distress. HEENT: normal Neck: JVP normal. Carotid upstrokes markedly diminished with bilateral bruits. No thyromegaly. Lungs: equal expansion, clear bilaterally CV: Apex is discrete and nondisplaced, RRR with grade 4/6 high pitched late peaking crescendo decrescendo murmur best heard at the apex. A2 is an audible. Abd: soft, positive bowel sounds Back: no CVA tenderness Ext: no C/C/E        Femoral pulses are 2+. The femoral arteries feel calcified to palpation Skin: warm and dry without rash Neuro: CNII-XII intact             Strength intact = bilaterally    Labs:  Recent Labs  10/25/2013 1636  TROPONINI <0.30   Lab Results  Component Value Date   WBC 5.5 11/23/2013   HGB 12.4* 11/09/2013   HCT 37.1* 11/19/2013   MCV 95.4 11/05/2013   PLT 71* 11/13/2013    Recent  Labs Lab 10/16/2013 1636  11/28/2013 0308  NA 138  < > 143  K 4.0  < > 3.6*  CL 102  < > 109  CO2 22  --  23  BUN 14  < > 13  CREATININE 0.92  < > 0.95  CALCIUM 9.0  --  7.8*  PROT 6.5  --   --   BILITOT 0.6  --   --   ALKPHOS 54  --   --   ALT 10  --   --   AST 28  --   --   GLUCOSE 91  < > 93  < > = values in this interval not displayed. No results found for this basename: CHOL, HDL, LDLCALC, TRIG   No results found for this basename: DDIMER    Radiology/Studies:  Dg Chest  2 View  10/30/2013   CLINICAL DATA:  pre op  EXAM: CHEST - 2 VIEW  COMPARISON:  04/15/2010  FINDINGS: Somewhat attenuated peripheral bronchovascular markings and coarse perihilar markings. No focal infiltrate or overt edema. Heart size normal. No effusion. Spondylitic changes at several contiguous levels in the mid thoracic spine.  IMPRESSION: 1. Stable chronic interstitial changes.  No acute disease.   Electronically Signed   By: Oley Balm M.D.   On: 10/30/2013 01:47   US Abdomen Complete  10/19/2013   ADDENDUM REPORT: 10/03/2013 12:47  ADDENDUM: Findings and recommendations were discussed with Dr. Shana Chute at 12:40 p.m. As patient has no abdominal pain, the findings are likely stable/chronic. Further evaluation with CT was recommended on an urgent, but not emergent basis.   Electronically Signed   By: Elberta Fortis M.D.   On: 10/16/2013 12:47   10/10/2013   CLINICAL DATA:  Acute abdominal pain.  EXAM: ULTRASOUND ABDOMEN COMPLETE  COMPARISON:  None.  FINDINGS: Exam somewhat limited due to patient body habitus and abundant overlying bowel gas.  Gallbladder:  No gallstones or wall thickening visualized. No sonographic Murphy sign noted.  Common bile duct:  Diameter: 4 mm.  Liver:  No focal lesion identified. Within normal limits in parenchymal echogenicity.  IVC:  Not visualized.  Pancreas:  Visualized portion unremarkable.  Spleen:  Size and appearance within normal limits.  Right Kidney:  Length: 8.6 cm.  Echogenicity within normal limits. No mass or hydronephrosis visualized.  Left Kidney:  Length: 8.7 cm. Echogenicity within normal limits. No mass or hydronephrosis visualized.  Abdominal aorta:  Proximal segment not visualized. Moderate atherosclerotic disease with significant aneurysmal dilatation of the mid to distal abdominal aorta measuring approximately 7.8 x 8.1 cm in its AP and transverse dimensions and extending approximately 10.8 cm in length. There is moderate mural thrombus as the lumen measures approximately 2.2 x 3.2 cm in AP and transverse dimension.  Other findings:  None.  IMPRESSION: Significant aneurysm of the mid to distal abdominal aorta measuring approximately 7.8 x 8.1 cm in its AP and transverse dimensions and extending 10.8 cm in length. Moderate mural thrombus as the lumen measures 2.2 x 3.2 cm. Recommend CT abdomen with and without contrast to exclude contained rupture in this patient with abdominal pain.  These results were called by telephone at the time of interpretation on 10/04/2013 at 11:11 am to Dr. Kevin Fenton SPRUILL's recepeptionist, Frederick Peers, , who verbally acknowledged these results. We are unable to reach Dr. Shana Chute at this time. I therefore called patient directly as patient states he has no known history of abdominal aneurysm and states he has no abdominal pain. We discussed that he needs further evaluation with CT scan and that Dr. Magda Kiel office will be contacting him to scheduled the exam. We discussed that he should proceed to the nearest ER if he experiences abdominal pain.  Electronically Signed: By: Elberta Fortis M.D. On: 10/06/2013 11:26   Ct Cta Abd/pel W/cm &/or W/o Cm  10/02/2013   CLINICAL DATA:  ABDOMINAL PAIN, AAA  EXAM: CT ANGIOGRAPHY ABDOMEN AND PELVIS  TECHNIQUE: Multidetector CT imaging of the abdomen and pelvis was performed using the standard protocol during bolus administration of intravenous contrast. Multiplanar reconstructed images including MIPs  were obtained and reviewed to evaluate the vascular anatomy.  CONTRAST:  OMNIPAQUE IOHEXOL 350 MG/ML SOLN  COMPARISON:  Ultrasound from earlier the same day  FINDINGS: ARTERIAL FINDINGS:  Coronary calcifications.  Aorta: Mild atheromatous plaque in the ectatic  and torturous mildly tortuous visualized distal descending thoracic aorta. There is more extensive irregular partially calcified plaque in the juxtarenal aorta. There is a fusiform infrarenal aneurysm measuring 8.4 x 8.5 cm maximum transverse diameter, tapering to a diameter of 4.7 cm at the bifurcation. There is a large amount of nonocclusive mural thrombus. No adjacent inflammatory/edematous changes. No retroperitoneal hematoma.  Celiac axis: There is an eccentric origin diverticulum which measures approximately 10 mm diameter. There is mild short-segment narrowing over approximately 1 cm at the level of the median arcuate ligament of the diaphragm, patent distally.  Superior mesenteric: Calcified ostial plaque resulting in short segment mild stenosis of doubtful hemodynamic significance. Patent distally with classic branch anatomy.  Left renal:           Single, patent  Right renal: Single. There is eccentric partially calcified plaque extending from the origin over a length of approximately 2 cm resulting in mild stenosis. Patent distally.  Inferior mesenteric: Short-segment origin occlusion, reconstituted distally by visceral collaterals.  Left iliac: The origin of the common iliac is involved by the aneurysm, measuring 2 cm diameter, tapering to a diameter of 12 mm at the common iliac bifurcation. Internal iliac is tortuous and atheromatous. There is mild tortuosity of the external iliac artery with some scattered plaque, no stenosis or aneurysm. There is origin occlusion of the SFA, distal extent not included on the study.  Right iliac: Ectatic common iliac artery, 14 mm diameter at its origin, tapering to 13 mm. There is scattered plaque in the  internal and external iliac arteries without high-grade stenosis or aneurysm. Marked tortuosity of the external iliac artery may be problematic for percutaneous access.  Venous findings:      Dedicated venous phase imaging not obtained.  Review of the MIP images confirms the above findings.  Nonvascular findings: Moderately advanced emphysematous changes in the visualized lung bases with some dependent atelectasis posteriorly. Unremarkable arterial phase evaluation of liver, spleen, adrenal glands, pancreas, right kidney. There is a 17 mm low-attenuation lesion exophytic from the mid portion of the left kidney medial to the collecting system, measuring above simple fluid attenuation. The stomach, small bowel, and colon are nondilated. Normal appendix. Scattered colonic diverticula most numerous in the sigmoid segment, without adjacent inflammatory/ edematous change. Bilateral hip arthroplasty hardware results in streak artifact degrading portions of the scan. Urinary bladder is physiologically distended. No ascites. No free air. No adenopathy. Degenerative disc disease in the lumbar spine most marked L4-5 and L5-S1.  IMPRESSION: 1. 8.5 cm infrarenal abdominal aortic aneurysm extending across the bifurcation to involve the proximal left common iliac artery. No evidence of rupture or impending rupture. However, the size presents will a significant risk of rupture, and vascular surgical consultation is recommended. 2. Tortuous right iliac arterial system without aneurysm or stenosis. 3. Origin occlusion of the left SFA. 4. 17 mm exophytic left renal mass, possibly hyperdense cyst but incompletely characterized. 5. Colonic diverticulosis.   Electronically Signed   By: Oley Balm M.D.   On: 10/01/2013 19:07   2-D echocardiogram: Left ventricle: The cavity size was normal. There was mild concentric hypertrophy. Systolic function was normal. The estimated ejection fraction was in the range of 55% to 60%. Wall  motion was normal; there were no regional wall motion abnormalities. Doppler parameters are consistent with abnormal left ventricular relaxation (grade 1 diastolic dysfunction).  ------------------------------------------------------------------- Aortic valve: Moderately calcified leaflets. Doppler: There was critical stenosis. There was trivial regurgitation. VTI ratio of LVOT to aortic valve:  0.11. Valve area (VTI): 0.36 cm^2. Indexed valve area (VTI): 0.19 cm^2/m^2. Peak velocity ratio of LVOT to aortic valve: 0.13. Valve area (Vmax): 0.45 cm^2. Indexed valve area (Vmax): 0.24 cm^2/m^2. Mean velocity ratio of LVOT to aortic valve: 0.12. Valve area (Vmean): 0.4 cm^2. Indexed valve area (Vmean): 0.21 cm^2/m^2. Mean gradient (S): 116 mm Hg. Peak gradient (S): 171 mm Hg.  ------------------------------------------------------------------- Mitral valve: Calcified annulus. Severely thickened, severely calcified leaflets posterior. Doppler: The findings are consistent with moderate stenosis. There was trivial regurgitation. Valve area by continuity equation (using LVOT flow): 1.29 cm^2. Indexed valve area by continuity equation (using LVOT flow): 0.68 cm^2/m^2. Mean gradient (D): 5 mm Hg. Peak gradient (D): 8 mm Hg.  ------------------------------------------------------------------- Left atrium: The atrium was normal in size.  ------------------------------------------------------------------- Atrial septum: No defect or patent foramen ovale was identified.  ------------------------------------------------------------------- Right ventricle: The cavity size was normal. Wall thickness was normal. Systolic function was normal.  ------------------------------------------------------------------- Pulmonic valve: Poorly visualized. Doppler: There was no significant regurgitation.  ------------------------------------------------------------------- Tricuspid valve: Structurally normal  valve. Leaflet separation was normal. Doppler: Transvalvular velocity was within the normal range. There was mild regurgitation.  ------------------------------------------------------------------- Right atrium: The atrium was normal in size.  ------------------------------------------------------------------- Pericardium: There was no pericardial effusion.  ------------------------------------------------------------------- Measurements  Left ventricle Value Reference LV ID, ED, PLAX chordal 44.8 mm 43 - 52 LV ID, ES, PLAX chordal 30.7 mm 23 - 38 LV fx shortening, PLAX chordal 31 % >=29 LV PW thickness, ED 12.9 mm --------- IVS/LV PW ratio, ED 0.96 <=1.3 Stroke volume, 2D 60 ml --------- Stroke volume/bsa, 2D 32 ml/m^2 --------- LV e&', lateral 7.96 cm/s --------- LV E/e&', lateral 17.96 --------- LV e&', medial 4.17 cm/s --------- LV E/e&', medial 34.29 --------- LV e&', average 6.07 cm/s --------- LV E/e&', average 23.58 ---------  Ventricular septum Value Reference IVS thickness, ED 12.4 mm ---------  LVOT Value Reference LVOT ID, S 21 mm --------- LVOT area 3.46 cm^2 --------- LVOT peak velocity, S 84.5 cm/s --------- LVOT mean velocity, S 60.3 cm/s --------- LVOT VTI, S 17.3 cm ---------  Aortic valve Value Reference Aortic valve peak velocity, S 654 cm/s --------- Aortic valve mean velocity, S 521 cm/s --------- Aortic valve VTI, S 161 cm --------- Aortic mean gradient, S 116 mm Hg --------- Aortic peak gradient, S 171 mm Hg --------- VTI ratio, LVOT/AV 0.11 --------- Aortic valve area, VTI 0.36 cm^2 --------- Aortic valve area/bsa, VTI 0.19 cm^2/m^2 --------- Velocity ratio, peak, LVOT/AV 0.13 --------- Aortic valve area, peak velocity 0.45 cm^2 --------- Aortic valve area/bsa, peak 0.24 cm^2/m^2 --------- velocity Velocity ratio, mean, LVOT/AV 0.12 --------- Aortic valve area, mean velocity 0.4 cm^2 --------- Aortic valve area/bsa, mean 0.21 cm^2/m^2  --------- velocity Aortic regurg pressure half-time 260 ms ---------  Aorta Value Reference Aortic root ID, ED 34 mm ---------  Left atrium Value Reference LA ID, A-P, ES 38 mm --------- LA ID/bsa, A-P 2.01 cm/m^2 <=2.2  Mitral valve Value Reference Mitral E-wave peak velocity 143 cm/s --------- Mitral A-wave peak velocity 173 cm/s --------- Mitral mean velocity, D 105 cm/s --------- Mitral mean gradient, D 5 mm Hg --------- Mitral peak gradient, D 8 mm Hg --------- Mitral E/A ratio, peak 0.8 --------- Mitral valve area, LVOT 1.29 cm^2 --------- continuity Mitral valve area/bsa, LVOT 0.68 cm^2/m^2 --------- continuity Mitral annulus VTI, D 42.9 cm ---------  Pulmonary arteries Value Reference PA pressure, S, DP 26 mm Hg <=30  Tricuspid valve Value Reference Tricuspid regurg peak velocity 238 cm/s --------- Tricuspid peak RV-RA gradient 23 mm  Hg --------- Tricuspid maximal regurg 238 cm/s --------- velocity, PISA  Systemic veins Value Reference Estimated CVP 3 mm Hg ---------  Right ventricle Value Reference RV pressure, S, DP 26 mm Hg <=30  Legend: (L) and (H) mark values outside specified reference range.  -------------------------------------------  RISK SCORES About the STS Risk Calculator Procedure: AV Replacement Risk of Mortality: 4.904% Morbidity or Mortality: 29.123% Long Length of Stay: 15.128% Short Length of Stay: 15.624% Permanent Stroke: 2.742% Prolonged Ventilation: 17.229% DSW Infection: 0.358% Renal Failure: 8.371% Reoperation: 12.979%   ASSESSMENT AND PLAN:  1. Critical symptomatic aortic stenosis 2. Large infrarenal abdominal aortic aneurysm 3. Hypertension 4. Thrombocytopenia  This is a difficult management situation in a patient with a very large infrarenal abdominal aortic aneurysm at high risk of rupture, as well as newly diagnosed critical aortic stenosis with associated exertional shortness of breath. When considering treatment  options for his severe aortic stenosis, comorbid medical conditions include most notably has a large AAA, but also limited mobility, advanced age, thrombocytopenia, COPD, and possible hepatic dysfunction considering his elevated INR in an absence of warfarin therapy. The next step in his evaluation is diagnostic cardiac catheterization to rule out obstructive coronary artery disease.   I have reviewed the risks, indications, and alternatives to cardiac catheterization were reviewed with the patient. Risks include but are not limited to bleeding, infection, vascular injury, stroke, myocardial infection, arrhythmia, emergency cardiac surgery, and death. The patient understands the risks of serious complication is low (<1%).  Following his heart catheterization, the patient will be seen by cardiac surgery to evaluate options. Considerations include a combined procedure (TAVR/EVAR) versus balloon valvuloplasty as a bridge through EVAR with definitive treatment for his aortic valve disease following endovascular treatment of his aneurysm. His risk of cardiac surgery is probably not well-captured in the STS risk model because his AAA is not taken into account. Will have to carefully consider options with cardiac surgery, vascular surgery, and anesthesia after cardiac cath.  Signed, [name] MD, [facility] 11/12/2013, 10:52 AM

## 2013-10-31 NOTE — H&P (View-Only) (Signed)
  INTERVENTIONAL CARDIOLOGY CONSULT NOTE  Patient ID: Shawn Mathews, MRN: [id number], DOB/AGE: 03/21/1927 80 y.o. Admit date: 09/29/2013 Date of Consult: 11/23/2013  Primary Physician: SPRUILL,JEROME O, MD Primary Cardiologist: Dr Spruill  Referring Physician: Dr Nishan  Chief Complaint: Shortness of breath Reason for Consultation: Severe/Critical Aortic Stenosis  HPI: This is an 80-year-old gentleman who presented to the emergency department on August 31 because of a newly discovered large infrarenal abdominal aortic aneurysm. The patient has had mild chronic abdominal discomfort but this is not felt by vascular surgery to be representative of pain related to his AAA. He was noted to have a heart murmur and an echocardiogram demonstrated critical aortic stenosis with peak and mean gradients of 171 and 116 mm mercury, respectively. The patient was initially scheduled for endovascular abdominal aortic aneurysm repair, but this has been delayed because of his cardiac condition. I was asked to evaluate Shawn Mathews by Dr Nishan to consider treatment options for his critical AS.  The patient lives independently in [place]. He is a widower for approximately 12 years. He retired nearly 20 years ago. He is modestly active, but is limited by right hip pain and also shortness of breath. The patient still drives a car. He has help keeping up his house a few days per week, but otherwise remains independent. He reports a 2-3 year history of exertional dyspnea, slowly progressive. The patient is dyspneic with low-level activity such as walking a short distance on flat ground. He denies chest pain or pressure. He does have episodes of postural lightheadedness, but has not had frank syncope. He has had problems with right hip pain over the past 2-3 months. The patient has undergone 2 hip replacements on both sides. He now ambulates with a cane. He denies abdominal pain at present. He denies orthopnea, PND, or leg  swelling. He has been told of a heart murmur for several years, his murmur just recently worsened and he was not previously aware of this severely of his aortic stenosis.  Medical History:  Past Medical History  Diagnosis Date  . Hypertension   . Gout   . Glaucoma       Surgical History:  Past Surgical History  Procedure Laterality Date  . Total hip arthroplasty    . Lumbar disc surgery       Home Meds: Prior to Admission medications   Medication Sig Start Date End Date Taking? Authorizing Provider  allopurinol (ZYLOPRIM) 100 MG tablet Take by mouth daily.   Yes Historical Provider, MD  amLODipine (NORVASC) 5 MG tablet Take 5 mg by mouth daily.   Yes Historical Provider, MD  dorzolamide (TRUSOPT) 2 % ophthalmic solution Place 1 drop into both eyes 2 (two) times daily.  10/11/13  Yes Historical Provider, MD  traMADol (ULTRAM) 50 MG tablet Take 50 mg by mouth every 6 (six) hours as needed for moderate pain.  10/24/13  Yes Historical Provider, MD    Inpatient Medications:  . sodium chloride   Intravenous Once  . allopurinol  100 mg Oral Daily  . amLODipine  5 mg Oral Daily  . docusate sodium  100 mg Oral BID  . dorzolamide  1 drop Both Eyes BID  . feeding supplement (ENSURE COMPLETE)  237 mL Oral BID BM  . pantoprazole  40 mg Oral Daily  . sodium chloride  3 mL Intravenous Q12H  . vancomycin  1,000 mg Intravenous To OR   . sodium chloride 75 mL/hr at 11/16/2013 0037      Allergies:  Allergies  Allergen Reactions  . Penicillins     unknown    History   Social History  . Marital Status: Widowed    Spouse Name: N/A    Number of Children: N/A  . Years of Education: N/A   Occupational History  . Not on file.   Social History Main Topics  . Smoking status: Former Smoker -- 30 years    Quit date: 03/29/1986  . Smokeless tobacco: Not on file  . Alcohol Use: No  . Drug Use: No  . Sexual Activity:    Other Topics Concern  . Not on file   Social History Narrative  .  No narrative on file     Family History  Problem Relation Age of Onset  . Diabetes Brother   . Coronary artery disease Brother   . Stroke Brother   . Diabetes Mother    Review of Systems: General: negative for chills, fever, night sweats or weight changes.  ENT: negative for rhinorrhea or epistaxis Cardiovascular:  See history of present illness Dermatological: negative for rash Respiratory: negative for cough or wheezing GI: negative for nausea, vomiting, diarrhea, bright red blood per rectum, melena, or hematemesis GU: no hematuria, urgency, or frequency Neurologic: negative for visual changes, syncope, headache, or dizziness Heme: no easy bruising or bleeding Endo: negative for excessive thirst, thyroid disorder, or flushing Musculoskeletal: Positive for low back and bilateral hip pain right worse than left All other systems reviewed and are otherwise negative except as noted above.  Physical Exam: Blood pressure 125/80, pulse 71, temperature 97.9 F (36.6 C), temperature source Oral, resp. rate 27, height 5' 10" (1.778 m), weight 161 lb 6 oz (73.2 kg), SpO2 95.00%. Pt is alert and oriented, WD, WN, in no distress. HEENT: normal Neck: JVP normal. Carotid upstrokes markedly diminished with bilateral bruits. No thyromegaly. Lungs: equal expansion, clear bilaterally CV: Apex is discrete and nondisplaced, RRR with grade 4/6 high pitched late peaking crescendo decrescendo murmur best heard at the apex. A2 is an audible. Abd: soft, positive bowel sounds Back: no CVA tenderness Ext: no C/C/E        Femoral pulses are 2+. The femoral arteries feel calcified to palpation Skin: warm and dry without rash Neuro: CNII-XII intact             Strength intact = bilaterally    Labs:  Recent Labs  10/26/2013 1636  TROPONINI <0.30   Lab Results  Component Value Date   WBC 5.5 11/11/2013   HGB 12.4* 11/27/2013   HCT 37.1* 11/14/2013   MCV 95.4 11/10/2013   PLT 71* 11/17/2013    Recent  Labs Lab 10/07/2013 1636  11/27/2013 0308  NA 138  < > 143  K 4.0  < > 3.6*  CL 102  < > 109  CO2 22  --  23  BUN 14  < > 13  CREATININE 0.92  < > 0.95  CALCIUM 9.0  --  7.8*  PROT 6.5  --   --   BILITOT 0.6  --   --   ALKPHOS 54  --   --   ALT 10  --   --   AST 28  --   --   GLUCOSE 91  < > 93  < > = values in this interval not displayed. No results found for this basename: CHOL, HDL, LDLCALC, TRIG   No results found for this basename: DDIMER    Radiology/Studies:  Dg Chest   2 View  10/30/2013   CLINICAL DATA:  pre op  EXAM: CHEST - 2 VIEW  COMPARISON:  04/15/2010  FINDINGS: Somewhat attenuated peripheral bronchovascular markings and coarse perihilar markings. No focal infiltrate or overt edema. Heart size normal. No effusion. Spondylitic changes at several contiguous levels in the mid thoracic spine.  IMPRESSION: 1. Stable chronic interstitial changes.  No acute disease.   Electronically Signed   By: Daniel  Hassell M.D.   On: 10/30/2013 01:47   Us Abdomen Complete  10/13/2013   ADDENDUM REPORT: 10/06/2013 12:47  ADDENDUM: Findings and recommendations were discussed with Dr. Spruill at 12:40 p.m. As patient has no abdominal pain, the findings are likely stable/chronic. Further evaluation with CT was recommended on an urgent, but not emergent basis.   Electronically Signed   By: Daniel  Boyle M.D.   On: 10/28/2013 12:47   10/10/2013   CLINICAL DATA:  Acute abdominal pain.  EXAM: ULTRASOUND ABDOMEN COMPLETE  COMPARISON:  None.  FINDINGS: Exam somewhat limited due to patient body habitus and abundant overlying bowel gas.  Gallbladder:  No gallstones or wall thickening visualized. No sonographic Murphy sign noted.  Common bile duct:  Diameter: 4 mm.  Liver:  No focal lesion identified. Within normal limits in parenchymal echogenicity.  IVC:  Not visualized.  Pancreas:  Visualized portion unremarkable.  Spleen:  Size and appearance within normal limits.  Right Kidney:  Length: 8.6 cm.  Echogenicity within normal limits. No mass or hydronephrosis visualized.  Left Kidney:  Length: 8.7 cm. Echogenicity within normal limits. No mass or hydronephrosis visualized.  Abdominal aorta:  Proximal segment not visualized. Moderate atherosclerotic disease with significant aneurysmal dilatation of the mid to distal abdominal aorta measuring approximately 7.8 x 8.1 cm in its AP and transverse dimensions and extending approximately 10.8 cm in length. There is moderate mural thrombus as the lumen measures approximately 2.2 x 3.2 cm in AP and transverse dimension.  Other findings:  None.  IMPRESSION: Significant aneurysm of the mid to distal abdominal aorta measuring approximately 7.8 x 8.1 cm in its AP and transverse dimensions and extending 10.8 cm in length. Moderate mural thrombus as the lumen measures 2.2 x 3.2 cm. Recommend CT abdomen with and without contrast to exclude contained rupture in this patient with abdominal pain.  These results were called by telephone at the time of interpretation on 10/01/2013 at 11:11 am to Dr. JEROME SPRUILL's recepeptionist, Nicole Mebane, , who verbally acknowledged these results. We are unable to reach Dr. Spruill at this time. I therefore called patient directly as patient states he has no known history of abdominal aneurysm and states he has no abdominal pain. We discussed that he needs further evaluation with CT scan and that Dr. Spruill's office will be contacting him to scheduled the exam. We discussed that he should proceed to the nearest ER if he experiences abdominal pain.  Electronically Signed: By: Daniel  Boyle M.D. On: 10/22/2013 11:26   Ct Cta Abd/pel W/cm &/or W/o Cm  10/19/2013   CLINICAL DATA:  ABDOMINAL PAIN, AAA  EXAM: CT ANGIOGRAPHY ABDOMEN AND PELVIS  TECHNIQUE: Multidetector CT imaging of the abdomen and pelvis was performed using the standard protocol during bolus administration of intravenous contrast. Multiplanar reconstructed images including MIPs  were obtained and reviewed to evaluate the vascular anatomy.  CONTRAST:  100mL OMNIPAQUE IOHEXOL 350 MG/ML SOLN  COMPARISON:  Ultrasound from earlier the same day  FINDINGS: ARTERIAL FINDINGS:  Coronary calcifications.  Aorta: Mild atheromatous plaque in the ectatic   and torturous mildly tortuous visualized distal descending thoracic aorta. There is more extensive irregular partially calcified plaque in the juxtarenal aorta. There is a fusiform infrarenal aneurysm measuring 8.4 x 8.5 cm maximum transverse diameter, tapering to a diameter of 4.7 cm at the bifurcation. There is a large amount of nonocclusive mural thrombus. No adjacent inflammatory/edematous changes. No retroperitoneal hematoma.  Celiac axis: There is an eccentric origin diverticulum which measures approximately 10 mm diameter. There is mild short-segment narrowing over approximately 1 cm at the level of the median arcuate ligament of the diaphragm, patent distally.  Superior mesenteric: Calcified ostial plaque resulting in short segment mild stenosis of doubtful hemodynamic significance. Patent distally with classic branch anatomy.  Left renal:           Single, patent  Right renal: Single. There is eccentric partially calcified plaque extending from the origin over a length of approximately 2 cm resulting in mild stenosis. Patent distally.  Inferior mesenteric: Short-segment origin occlusion, reconstituted distally by visceral collaterals.  Left iliac: The origin of the common iliac is involved by the aneurysm, measuring 2 cm diameter, tapering to a diameter of 12 mm at the common iliac bifurcation. Internal iliac is tortuous and atheromatous. There is mild tortuosity of the external iliac artery with some scattered plaque, no stenosis or aneurysm. There is origin occlusion of the SFA, distal extent not included on the study.  Right iliac: Ectatic common iliac artery, 14 mm diameter at its origin, tapering to 13 mm. There is scattered plaque in the  internal and external iliac arteries without high-grade stenosis or aneurysm. Marked tortuosity of the external iliac artery may be problematic for percutaneous access.  Venous findings:      Dedicated venous phase imaging not obtained.  Review of the MIP images confirms the above findings.  Nonvascular findings: Moderately advanced emphysematous changes in the visualized lung bases with some dependent atelectasis posteriorly. Unremarkable arterial phase evaluation of liver, spleen, adrenal glands, pancreas, right kidney. There is a 17 mm low-attenuation lesion exophytic from the mid portion of the left kidney medial to the collecting system, measuring above simple fluid attenuation. The stomach, small bowel, and colon are nondilated. Normal appendix. Scattered colonic diverticula most numerous in the sigmoid segment, without adjacent inflammatory/ edematous change. Bilateral hip arthroplasty hardware results in streak artifact degrading portions of the scan. Urinary bladder is physiologically distended. No ascites. No free air. No adenopathy. Degenerative disc disease in the lumbar spine most marked L4-5 and L5-S1.  IMPRESSION: 1. 8.5 cm infrarenal abdominal aortic aneurysm extending across the bifurcation to involve the proximal left common iliac artery. No evidence of rupture or impending rupture. However, the size presents will a significant risk of rupture, and vascular surgical consultation is recommended. 2. Tortuous right iliac arterial system without aneurysm or stenosis. 3. Origin occlusion of the left SFA. 4. 17 mm exophytic left renal mass, possibly hyperdense cyst but incompletely characterized. 5. Colonic diverticulosis.   Electronically Signed   By: Daniel  Hassell M.D.   On: 10/27/2013 19:07   2-D echocardiogram: Left ventricle: The cavity size was normal. There was mild concentric hypertrophy. Systolic function was normal. The estimated ejection fraction was in the range of 55% to 60%. Wall  motion was normal; there were no regional wall motion abnormalities. Doppler parameters are consistent with abnormal left ventricular relaxation (grade 1 diastolic dysfunction).  ------------------------------------------------------------------- Aortic valve: Moderately calcified leaflets. Doppler: There was critical stenosis. There was trivial regurgitation. VTI ratio of LVOT to aortic valve:   0.11. Valve area (VTI): 0.36 cm^2. Indexed valve area (VTI): 0.19 cm^2/m^2. Peak velocity ratio of LVOT to aortic valve: 0.13. Valve area (Vmax): 0.45 cm^2. Indexed valve area (Vmax): 0.24 cm^2/m^2. Mean velocity ratio of LVOT to aortic valve: 0.12. Valve area (Vmean): 0.4 cm^2. Indexed valve area (Vmean): 0.21 cm^2/m^2. Mean gradient (S): 116 mm Hg. Peak gradient (S): 171 mm Hg.  ------------------------------------------------------------------- Mitral valve: Calcified annulus. Severely thickened, severely calcified leaflets posterior. Doppler: The findings are consistent with moderate stenosis. There was trivial regurgitation. Valve area by continuity equation (using LVOT flow): 1.29 cm^2. Indexed valve area by continuity equation (using LVOT flow): 0.68 cm^2/m^2. Mean gradient (D): 5 mm Hg. Peak gradient (D): 8 mm Hg.  ------------------------------------------------------------------- Left atrium: The atrium was normal in size.  ------------------------------------------------------------------- Atrial septum: No defect or patent foramen ovale was identified.  ------------------------------------------------------------------- Right ventricle: The cavity size was normal. Wall thickness was normal. Systolic function was normal.  ------------------------------------------------------------------- Pulmonic valve: Poorly visualized. Doppler: There was no significant regurgitation.  ------------------------------------------------------------------- Tricuspid valve: Structurally normal  valve. Leaflet separation was normal. Doppler: Transvalvular velocity was within the normal range. There was mild regurgitation.  ------------------------------------------------------------------- Right atrium: The atrium was normal in size.  ------------------------------------------------------------------- Pericardium: There was no pericardial effusion.  ------------------------------------------------------------------- Measurements  Left ventricle Value Reference LV ID, ED, PLAX chordal 44.8 mm 43 - 52 LV ID, ES, PLAX chordal 30.7 mm 23 - 38 LV fx shortening, PLAX chordal 31 % >=29 LV PW thickness, ED 12.9 mm --------- IVS/LV PW ratio, ED 0.96 <=1.3 Stroke volume, 2D 60 ml --------- Stroke volume/bsa, 2D 32 ml/m^2 --------- LV e&', lateral 7.96 cm/s --------- LV E/e&', lateral 17.96 --------- LV e&', medial 4.17 cm/s --------- LV E/e&', medial 34.29 --------- LV e&', average 6.07 cm/s --------- LV E/e&', average 23.58 ---------  Ventricular septum Value Reference IVS thickness, ED 12.4 mm ---------  LVOT Value Reference LVOT ID, S 21 mm --------- LVOT area 3.46 cm^2 --------- LVOT peak velocity, S 84.5 cm/s --------- LVOT mean velocity, S 60.3 cm/s --------- LVOT VTI, S 17.3 cm ---------  Aortic valve Value Reference Aortic valve peak velocity, S 654 cm/s --------- Aortic valve mean velocity, S 521 cm/s --------- Aortic valve VTI, S 161 cm --------- Aortic mean gradient, S 116 mm Hg --------- Aortic peak gradient, S 171 mm Hg --------- VTI ratio, LVOT/AV 0.11 --------- Aortic valve area, VTI 0.36 cm^2 --------- Aortic valve area/bsa, VTI 0.19 cm^2/m^2 --------- Velocity ratio, peak, LVOT/AV 0.13 --------- Aortic valve area, peak velocity 0.45 cm^2 --------- Aortic valve area/bsa, peak 0.24 cm^2/m^2 --------- velocity Velocity ratio, mean, LVOT/AV 0.12 --------- Aortic valve area, mean velocity 0.4 cm^2 --------- Aortic valve area/bsa, mean 0.21 cm^2/m^2  --------- velocity Aortic regurg pressure half-time 260 ms ---------  Aorta Value Reference Aortic root ID, ED 34 mm ---------  Left atrium Value Reference LA ID, A-P, ES 38 mm --------- LA ID/bsa, A-P 2.01 cm/m^2 <=2.2  Mitral valve Value Reference Mitral E-wave peak velocity 143 cm/s --------- Mitral A-wave peak velocity 173 cm/s --------- Mitral mean velocity, D 105 cm/s --------- Mitral mean gradient, D 5 mm Hg --------- Mitral peak gradient, D 8 mm Hg --------- Mitral E/A ratio, peak 0.8 --------- Mitral valve area, LVOT 1.29 cm^2 --------- continuity Mitral valve area/bsa, LVOT 0.68 cm^2/m^2 --------- continuity Mitral annulus VTI, D 42.9 cm ---------  Pulmonary arteries Value Reference PA pressure, S, DP 26 mm Hg <=30  Tricuspid valve Value Reference Tricuspid regurg peak velocity 238 cm/s --------- Tricuspid peak RV-RA gradient 23 mm   Hg --------- Tricuspid maximal regurg 238 cm/s --------- velocity, PISA  Systemic veins Value Reference Estimated CVP 3 mm Hg ---------  Right ventricle Value Reference RV pressure, S, DP 26 mm Hg <=30  Legend: (L) and (H) mark values outside specified reference range.  -------------------------------------------  RISK SCORES About the STS Risk Calculator Procedure: AV Replacement Risk of Mortality: 4.904% Morbidity or Mortality: 29.123% Long Length of Stay: 15.128% Short Length of Stay: 15.624% Permanent Stroke: 2.742% Prolonged Ventilation: 17.229% DSW Infection: 0.358% Renal Failure: 8.371% Reoperation: 12.979%   ASSESSMENT AND PLAN:  1. Critical symptomatic aortic stenosis 2. Large infrarenal abdominal aortic aneurysm 3. Hypertension 4. Thrombocytopenia  This is a difficult management situation in a patient with a very large infrarenal abdominal aortic aneurysm at high risk of rupture, as well as newly diagnosed critical aortic stenosis with associated exertional shortness of breath. When considering treatment  options for his severe aortic stenosis, comorbid medical conditions include most notably has a large AAA, but also limited mobility, advanced age, thrombocytopenia, COPD, and possible hepatic dysfunction considering his elevated INR in an absence of warfarin therapy. The next step in his evaluation is diagnostic cardiac catheterization to rule out obstructive coronary artery disease.   I have reviewed the risks, indications, and alternatives to cardiac catheterization were reviewed with the patient. Risks include but are not limited to bleeding, infection, vascular injury, stroke, myocardial infection, arrhythmia, emergency cardiac surgery, and death. The patient understands the risks of serious complication is low (<1%).  Following his heart catheterization, the patient will be seen by cardiac surgery to evaluate options. Considerations include a combined procedure (TAVR/EVAR) versus balloon valvuloplasty as a bridge through EVAR with definitive treatment for his aortic valve disease following endovascular treatment of his aneurysm. His risk of cardiac surgery is probably not well-captured in the STS risk model because his AAA is not taken into account. Will have to carefully consider options with cardiac surgery, vascular surgery, and anesthesia after cardiac cath.  Signed, Gwenyth Dingee MD, FACC 11/02/2013, 10:52 AM  

## 2013-10-31 NOTE — Interval H&P Note (Signed)
History and Physical Interval Note:  11/28/2013 11:17 AM  Shawn Mathews  has presented today for surgery, with the diagnosis of Abdominal aneurysm without mention of rupture   The various methods of treatment have been discussed with the patient and family. After consideration of risks, benefits and other options for treatment, the patient has consented to  Procedure(s): ABDOMINAL AORTIC ENDOVASCULAR STENT GRAFT VERSUS OPEN REPAIR (N/A) as a surgical intervention .  The patient's history has been reviewed, patient examined, no change in status, stable for surgery.  I have reviewed the patient's chart and labs.  Questions were answered to the patient's satisfaction.     [name]

## 2013-10-31 NOTE — Progress Notes (Signed)
VASCULAR LAB PRELIMINARY  ARTERIAL  ABI completed:    RIGHT    LEFT    PRESSURE WAVEFORM  PRESSURE WAVEFORM  BRACHIAL 128 Triphasic  BRACHIAL 139 Triphasic   DP 133 Biphasic  DP 82 Monophasic   AT   AT    PT 114 Biphasic  PT 301 Monophasic  PER   PER    GREAT TOE  NA GREAT TOE  NA    RIGHT LEFT  ABI 0.96 2.17  (0.59 at [facility])     Aurielle Slingerland, RVT 11/25/2013, 10:54 AM

## 2013-10-31 NOTE — CV Procedure (Signed)
TR band was at 3 ml of air, and due to the patient not keeping still it started bleeding. Next, 6 ml of air was added in the  Cuff, after it was repositioned higher on the arm.

## 2013-11-01 ENCOUNTER — Inpatient Hospital Stay (HOSPITAL_COMMUNITY): Payer: Medicare Other

## 2013-11-01 DIAGNOSIS — Z01810 Encounter for preprocedural cardiovascular examination: Secondary | ICD-10-CM

## 2013-11-01 DIAGNOSIS — I359 Nonrheumatic aortic valve disorder, unspecified: Secondary | ICD-10-CM

## 2013-11-01 LAB — PULMONARY FUNCTION TEST
FEF 25-75 PRE: 0.57 L/s
FEF 25-75 Post: 0.89 L/sec
FEF2575-%CHANGE-POST: 55 %
FEF2575-%PRED-POST: 47 %
FEF2575-%PRED-PRE: 30 %
FEV1-%Change-Post: 12 %
FEV1-%PRED-PRE: 52 %
FEV1-%Pred-Post: 58 %
FEV1-PRE: 1.34 L
FEV1-Post: 1.5 L
FEV1FVC-%Change-Post: -3 %
FEV1FVC-%PRED-PRE: 71 %
FEV6-%CHANGE-POST: 14 %
FEV6-%PRED-POST: 84 %
FEV6-%Pred-Pre: 74 %
FEV6-Post: 2.84 L
FEV6-Pre: 2.48 L
FEV6FVC-%CHANGE-POST: -1 %
FEV6FVC-%Pred-Post: 103 %
FEV6FVC-%Pred-Pre: 104 %
FVC-%Change-Post: 15 %
FVC-%Pred-Post: 81 %
FVC-%Pred-Pre: 70 %
FVC-POST: 2.92 L
FVC-Pre: 2.52 L
Post FEV1/FVC ratio: 51 %
Post FEV6/FVC ratio: 97 %
Pre FEV1/FVC ratio: 53 %
Pre FEV6/FVC Ratio: 99 %

## 2013-11-01 LAB — CBC
HEMATOCRIT: 40.1 % (ref 39.0–52.0)
Hemoglobin: 13.6 g/dL (ref 13.0–17.0)
MCH: 31.6 pg (ref 26.0–34.0)
MCHC: 33.9 g/dL (ref 30.0–36.0)
MCV: 93.3 fL (ref 78.0–100.0)
Platelets: 79 10*3/uL — ABNORMAL LOW (ref 150–400)
RBC: 4.3 MIL/uL (ref 4.22–5.81)
RDW: 15.9 % — AB (ref 11.5–15.5)
WBC: 6.6 10*3/uL (ref 4.0–10.5)

## 2013-11-01 LAB — TYPE AND SCREEN
ABO/RH(D): A POS
Antibody Screen: NEGATIVE
UNIT DIVISION: 0
Unit division: 0

## 2013-11-01 LAB — HEPATIC FUNCTION PANEL
ALBUMIN: 3.2 g/dL — AB (ref 3.5–5.2)
ALK PHOS: 48 U/L (ref 39–117)
ALT: 9 U/L (ref 0–53)
AST: 20 U/L (ref 0–37)
Bilirubin, Direct: <0.2 mg/dL (ref 0.0–0.3)
TOTAL PROTEIN: 5.9 g/dL — AB (ref 6.0–8.3)
Total Bilirubin: 0.7 mg/dL (ref 0.3–1.2)

## 2013-11-01 LAB — BASIC METABOLIC PANEL
Anion gap: 14 (ref 5–15)
BUN: 15 mg/dL (ref 6–23)
CO2: 24 meq/L (ref 19–32)
CREATININE: 0.86 mg/dL (ref 0.50–1.35)
Calcium: 9 mg/dL (ref 8.4–10.5)
Chloride: 104 mEq/L (ref 96–112)
GFR calc non Af Amer: 77 mL/min — ABNORMAL LOW (ref >90)
GFR, EST AFRICAN AMERICAN: 90 mL/min — AB (ref >90)
Glucose, Bld: 94 mg/dL (ref 70–99)
POTASSIUM: 3.9 meq/L (ref 3.7–5.3)
Sodium: 142 mEq/L (ref 137–147)

## 2013-11-01 LAB — C-REACTIVE PROTEIN: CRP: 0.6 mg/dL — ABNORMAL HIGH (ref <0.60)

## 2013-11-01 LAB — PREALBUMIN: PREALBUMIN: 23.3 mg/dL (ref 17.0–34.0)

## 2013-11-01 MED ORDER — ALBUTEROL SULFATE (2.5 MG/3ML) 0.083% IN NEBU
2.5000 mg | INHALATION_SOLUTION | Freq: Once | RESPIRATORY_TRACT | Status: AC
  Administered 2013-11-01: 2.5 mg via RESPIRATORY_TRACT

## 2013-11-01 MED ORDER — IOHEXOL 350 MG/ML SOLN
160.0000 mL | Freq: Once | INTRAVENOUS | Status: AC | PRN
  Administered 2013-11-01: 160 mL via INTRAVENOUS

## 2013-11-01 MED ORDER — NITROGLYCERIN 0.4 MG SL SUBL
SUBLINGUAL_TABLET | SUBLINGUAL | Status: AC
  Filled 2013-11-01: qty 1

## 2013-11-01 NOTE — Progress Notes (Addendum)
     Subjective  - Doing well, no abdominal pain.   Objective 129/89 72 97.5 F (36.4 C) (Oral) 19 93%  Intake/Output Summary (Last 24 hours) at 11/01/13 0759 Last data filed at 11/01/13 0600  Gross per 24 hour  Intake 2238.28 ml  Output   3950 ml  Net -1711.72 ml   Bilateral LE warm, grossly sensation and motor intact. Abdomin soft, NTTP   Assessment/Planning: AAA Critical aortic stenosis S/P Cardiac Catheterization Procedure  Final Conclusions:  1. Severely calcified aortic valve with known critical aortic stenosis (valve not crossed)  2. Diffuse nonobstructive coronary artery disease as detailed above  Recommendations: Cardiac surgical evaluation, gated cardiac CTA and chest CT for further operative planning      Clinton Gallant [facility] 11/01/2013 7:59 AM --  Laboratory Lab Results:  Recent Labs  11/20/2013 0308 11/01/13 0257  WBC 5.5 6.6  HGB 12.4* 13.6  HCT 37.1* 40.1  PLT 71* 79*   BMET  Recent Labs  11/14/2013 0308 11/01/13 0257  NA 143 142  K 3.6* 3.9  CL 109 104  CO2 23 24  GLUCOSE 93 94  BUN 13 15  CREATININE 0.95 0.86  CALCIUM 7.8* 9.0    COAG Lab Results  Component Value Date   INR 1.42 11/18/2013   No results found for this basename: PTT   Addendum  I have independently interviewed and examined the patient, and I agree with the physician assistant's findings.  Benign abd without any pain.  Cardiac cath findings noted.  ABI noted.  - I have asked Anesthesia to evaluate the patient for possible EVAR under regional vs MAC/local.  - Awaiting CV Surg input in regards to TAVR.  I doubt he will be a traditional AVR candidate.  - From Vascular viewpoint, I don't think patient needs to stay in ICU.  Not certain if Cardiac feels pt would be better served in CCU or floor.  [name], MD [facility] Office: [phone] Pager: [phone]  11/01/2013, 8:05 AM

## 2013-11-01 NOTE — Consult Note (Signed)
Reviewed all above notes from CTV and Vascular along w/ labs, EKG, ECHO etc. I think the best approach would be doing both in the same OR visit w/ a GA w/ usual full CV monitoring. Will await conversation between surgical groups. GES

## 2013-11-01 NOTE — Consult Note (Signed)
[address]       [place] [address]             [phone]      Cardiothoracic Surgery   Reason for Consult: Critical aortic stenosis and 8.5 cm AAA Referring Physician: Dr. Sherren Mocha  Shawn Mathews is an 80 y.o. male.  HPI:   The patient is an 80 year old gentleman who presented to the ER with abdominal pain and had an ultrasound showing an 8 cm AAA. He was scheduled for EVAR but was noted to have a heart murmur and had an echo showing critical AS with a mean gradient of 116 mm Hg and a peak gradient of 171 mm Hg. He was seen by Dr. Johnsie Cancel and underwent cardiac cath yesterday by Dr. Burt Knack showing diffuse non-obstructive CAD.   The patient is a widower for the past 12 years and lives independently. He takes care of his house and yard but is somewhat limited by right hip pain having had both hips replaced twice over the years. Over the past 6 months he has noted slowly progressive exertional shortness of breath and now gets short of breath with walking short distances. He has had no chest pain or pressure. He has had occasional episodes of dizziness and reports an episode of syncope in the past year.   Past Medical History  Diagnosis Date  . Hypertension   . Gout   . Glaucoma   . COPD (chronic obstructive pulmonary disease)     Past Surgical History  Procedure Laterality Date  . Total hip arthroplasty    . Lumbar disc surgery      Family History  Problem Relation Age of Onset  . Diabetes Brother   . Coronary artery disease Brother   . Stroke Brother   . Diabetes Mother     Social History:  reports that he quit smoking about 27 years ago. He does not have any smokeless tobacco history on file. He reports that he does not drink alcohol or use illicit drugs.  Allergies:  Allergies  Allergen Reactions  . Penicillins     unknown    Medications:  I have reviewed the patient's current medications. Prior to Admission:  Prescriptions prior to  admission  Medication Sig Dispense Refill  . allopurinol (ZYLOPRIM) 100 MG tablet Take by mouth daily.      Marland Kitchen amLODipine (NORVASC) 5 MG tablet Take 5 mg by mouth daily.      . dorzolamide (TRUSOPT) 2 % ophthalmic solution Place 1 drop into both eyes 2 (two) times daily.       . traMADol (ULTRAM) 50 MG tablet Take 50 mg by mouth every 6 (six) hours as needed for moderate pain.        Scheduled: . sodium chloride   Intravenous Once  . allopurinol  100 mg Oral Daily  . amLODipine  5 mg Oral Daily  . docusate sodium  100 mg Oral BID  . dorzolamide  1 drop Both Eyes BID  . feeding supplement (ENSURE COMPLETE)  237 mL Oral BID BM  . pantoprazole  40 mg Oral Daily  . sodium chloride  3 mL Intravenous Q12H  . sodium chloride  3 mL Intravenous Q12H   Continuous: . sodium chloride 75 mL/hr at 11/05/2013 2348   VZD:GLOVFI chloride, sodium chloride, acetaminophen, acetaminophen, alum & mag hydroxide-simeth, guaiFENesin-dextromethorphan, hydrALAZINE, labetalol, metoprolol, morphine injection, ondansetron, phenol, sodium chloride, sodium chloride, traMADol Anti-infectives  Start     Dose/Rate Route Frequency Ordered Stop   11/17/2013 0600  vancomycin (VANCOCIN) IVPB 1000 mg/200 mL premix     1,000 mg 200 mL/hr over 60 Minutes Intravenous To Surgery 10/30/13 0730 11/01/13 0600      Results for orders placed during the hospital encounter of 10/18/2013 (from the past 48 hour(s))  CBC     Status: Abnormal   Collection Time    11/24/2013  3:08 AM      Result Value Ref Range   WBC 5.5  4.0 - 10.5 Mathews/uL   RBC 3.89 (*) 4.22 - 5.81 MIL/uL   Hemoglobin 12.4 (*) 13.0 - 17.0 g/dL   Comment: DELTA CHECK NOTED     REPEATED TO VERIFY   HCT 37.1 (*) 39.0 - 52.0 %   MCV 95.4  78.0 - 100.0 fL   MCH 31.9  26.0 - 34.0 pg   MCHC 33.4  30.0 - 36.0 g/dL   RDW 16.0 (*) 11.5 - 15.5 %   Platelets 71 (*) 150 - 400 Mathews/uL   Comment: CONSISTENT WITH PREVIOUS RESULT  BASIC METABOLIC PANEL     Status: Abnormal   Collection  Time    11/16/2013  3:08 AM      Result Value Ref Range   Sodium 143  137 - 147 mEq/L   Potassium 3.6 (*) 3.7 - 5.3 mEq/L   Chloride 109  96 - 112 mEq/L   CO2 23  19 - 32 mEq/L   Glucose, Bld 93  70 - 99 mg/dL   BUN 13  6 - 23 mg/dL   Creatinine, Ser 0.95  0.50 - 1.35 mg/dL   Calcium 7.8 (*) 8.4 - 10.5 mg/dL   GFR calc non Af Amer 74 (*) >90 mL/min   GFR calc Af Amer 86 (*) >90 mL/min   Comment: (NOTE)     The eGFR has been calculated using the CKD EPI equation.     This calculation has not been validated in all clinical situations.     eGFR's persistently <90 mL/min signify possible Chronic Kidney     Disease.   Anion gap 11  5 - 15  PROTIME-INR     Status: Abnormal   Collection Time    11/09/2013  3:08 AM      Result Value Ref Range   Prothrombin Time 17.4 (*) 11.6 - 15.2 seconds   INR 1.42  0.00 - 1.49  HEPATIC FUNCTION PANEL     Status: Abnormal   Collection Time    11/01/13  2:57 AM      Result Value Ref Range   Total Protein 5.9 (*) 6.0 - 8.3 g/dL   Albumin 3.2 (*) 3.5 - 5.2 g/dL   AST 20  0 - 37 U/L   ALT 9  0 - 53 U/L   Alkaline Phosphatase 48  39 - 117 U/L   Total Bilirubin 0.7  0.3 - 1.2 mg/dL   Bilirubin, Direct <0.2  0.0 - 0.3 mg/dL   Indirect Bilirubin NOT CALCULATED  0.3 - 0.9 mg/dL  CBC     Status: Abnormal   Collection Time    11/01/13  2:57 AM      Result Value Ref Range   WBC 6.6  4.0 - 10.5 Mathews/uL   RBC 4.30  4.22 - 5.81 MIL/uL   Hemoglobin 13.6  13.0 - 17.0 g/dL   HCT 40.1  39.0 - 52.0 %   MCV 93.3  78.0 - 100.0 fL  MCH 31.6  26.0 - 34.0 pg   MCHC 33.9  30.0 - 36.0 g/dL   RDW 15.9 (*) 11.5 - 15.5 %   Platelets 79 (*) 150 - 400 Mathews/uL   Comment: CONSISTENT WITH PREVIOUS RESULT  BASIC METABOLIC PANEL     Status: Abnormal   Collection Time    11/01/13  2:57 AM      Result Value Ref Range   Sodium 142  137 - 147 mEq/L   Potassium 3.9  3.7 - 5.3 mEq/L   Chloride 104  96 - 112 mEq/L   CO2 24  19 - 32 mEq/L   Glucose, Bld 94  70 - 99 mg/dL   BUN 15  6  - 23 mg/dL   Creatinine, Ser 0.86  0.50 - 1.35 mg/dL   Calcium 9.0  8.4 - 10.5 mg/dL   GFR calc non Af Amer 77 (*) >90 mL/min   GFR calc Af Amer 90 (*) >90 mL/min   Comment: (NOTE)     The eGFR has been calculated using the CKD EPI equation.     This calculation has not been validated in all clinical situations.     eGFR's persistently <90 mL/min signify possible Chronic Kidney     Disease.   Anion gap 14  5 - 15    No results found.  Review of Systems  Constitutional: Negative for fever, chills, weight loss, malaise/fatigue and diaphoresis.  HENT: Negative.   Eyes: Negative.   Respiratory: Negative for cough and hemoptysis.   Cardiovascular: Negative for chest pain, palpitations, orthopnea, claudication, leg swelling and PND.       Exertional dyspnea  Gastrointestinal: Negative.   Genitourinary: Negative.   Musculoskeletal: Positive for back pain. Negative for falls.       Right hip pain  Skin: Negative.   Neurological: Positive for dizziness.  Endo/Heme/Allergies: Does not bruise/bleed easily.  Psychiatric/Behavioral: Negative.    Blood pressure 129/89, pulse 72, temperature 97.5 F (36.4 C), temperature source Oral, resp. rate 19, height 5' 10"  (1.778 m), weight 161 lb 6 oz (73.2 kg), SpO2 93.00%. Physical Exam  Constitutional: He is oriented to person, place, and time. He appears well-developed and well-nourished. No distress.  HENT:  Head: Normocephalic and atraumatic.  Mouth/Throat: Oropharynx is clear and moist.  Eyes: EOM are normal. Pupils are equal, round, and reactive to light.  Neck: Normal range of motion. Neck supple. No JVD present.  Cardiovascular: Normal rate and regular rhythm.   Murmur heard. High pitched systolic murmur loudest over left sternal border. Soft S2  Respiratory: Effort normal and breath sounds normal. No respiratory distress. He has no rales.  GI: Soft. Bowel sounds are normal. He exhibits no distension and no mass. There is no tenderness.    Musculoskeletal: He exhibits no edema and no tenderness.  Lymphadenopathy:    He has no cervical adenopathy.  Neurological: He is alert and oriented to person, place, and time. He has normal strength. No cranial nerve deficit or sensory deficit.  Skin: Skin is warm and dry.  Psychiatric: He has a normal mood and affect.    *[facility]* [address], [place] [address] [phone]  ------------------------------------------------------------------- Transthoracic Echocardiography  Patient: Shawn Mathews, Shawn Mathews MR #: [id number] Study Date: 10/21/2013 Gender: M Age: 24 Height: 177.8 cm Weight: 72.6 kg BSA: 1.89 m^2 Pt. Status: Room:  PERFORMING Charolette Forward, MD ATTENDING Patricia Nettle ORDERING [place],  RCS  cc:  ------------------------------------------------------------------- LV EF: 55% - 60%  ------------------------------------------------------------------- Indications: Murmur 785.2.  ------------------------------------------------------------------- History: Risk factors: Former tobacco use.  ------------------------------------------------------------------- Study Conclusions  - Left ventricle: The cavity size was normal. There was mild concentric hypertrophy. Systolic function was normal. The estimated ejection fraction was in the range of 55% to 60%. Wall motion was normal; there were no regional wall motion abnormalities. Doppler parameters are consistent with abnormal left ventricular relaxation (grade 1 diastolic dysfunction). - Aortic valve: There was critical stenosis. There was trivial regurgitation. Valve area (VTI): 0.36 cm^2. Valve area (Vmax): 0.45 cm^2. Valve area (Vmean): 0.4 cm^2. - Mitral valve: Calcified annulus. Severely thickened, severely calcified leaflets posterior. The findings are consistent with moderate stenosis. Valve area by  continuity equation (using LVOT flow): 1.29 cm^2. - Atrial septum: No defect or patent foramen ovale was identified.  Transthoracic echocardiography. M-mode, complete 2D, spectral Doppler, and color Doppler. Birthdate: Patient birthdate: October 06, 1927. Age: Patient is 80 yr old. Sex: Gender: male. BMI: 23 kg/m^2. Blood pressure: 131/78 Patient status: Outpatient. Study date: Study date: 10/17/2013. Study time: 01:18 PM. Location: Echo laboratory.  -------------------------------------------------------------------  ------------------------------------------------------------------- Left ventricle: The cavity size was normal. There was mild concentric hypertrophy. Systolic function was normal. The estimated ejection fraction was in the range of 55% to 60%. Wall motion was normal; there were no regional wall motion abnormalities. Doppler parameters are consistent with abnormal left ventricular relaxation (grade 1 diastolic dysfunction).  ------------------------------------------------------------------- Aortic valve: Moderately calcified leaflets. Doppler: There was critical stenosis. There was trivial regurgitation. VTI ratio of LVOT to aortic valve: 0.11. Valve area (VTI): 0.36 cm^2. Indexed valve area (VTI): 0.19 cm^2/m^2. Peak velocity ratio of LVOT to aortic valve: 0.13. Valve area (Vmax): 0.45 cm^2. Indexed valve area (Vmax): 0.24 cm^2/m^2. Mean velocity ratio of LVOT to aortic valve: 0.12. Valve area (Vmean): 0.4 cm^2. Indexed valve area (Vmean): 0.21 cm^2/m^2. Mean gradient (S): 116 mm Hg. Peak gradient (S): 171 mm Hg.  ------------------------------------------------------------------- Mitral valve: Calcified annulus. Severely thickened, severely calcified leaflets posterior. Doppler: The findings are consistent with moderate stenosis. There was trivial regurgitation. Valve area by continuity equation (using LVOT flow): 1.29 cm^2. Indexed valve area by continuity equation  (using LVOT flow): 0.68 cm^2/m^2. Mean gradient (D): 5 mm Hg. Peak gradient (D): 8 mm Hg.  ------------------------------------------------------------------- Left atrium: The atrium was normal in size.  ------------------------------------------------------------------- Atrial septum: No defect or patent foramen ovale was identified.  ------------------------------------------------------------------- Right ventricle: The cavity size was normal. Wall thickness was normal. Systolic function was normal.  ------------------------------------------------------------------- Pulmonic valve: Poorly visualized. Doppler: There was no significant regurgitation.  ------------------------------------------------------------------- Tricuspid valve: Structurally normal valve. Leaflet separation was normal. Doppler: Transvalvular velocity was within the normal range. There was mild regurgitation.  ------------------------------------------------------------------- Right atrium: The atrium was normal in size.  ------------------------------------------------------------------- Pericardium: There was no pericardial effusion.  ------------------------------------------------------------------- Measurements  Left ventricle Value Reference LV ID, ED, PLAX chordal 44.8 mm 43 - 52 LV ID, ES, PLAX chordal 30.7 mm 23 - 38 LV fx shortening, PLAX chordal 31 % >=29 LV PW thickness, ED 12.9 mm --------- IVS/LV PW ratio, ED 0.96 <=1.3 Stroke volume, 2D 60 ml --------- Stroke volume/bsa, 2D 32 ml/m^2 --------- LV e&', lateral 7.96 cm/s --------- LV E/e&', lateral 17.96 --------- LV e&', medial 4.17 cm/s --------- LV E/e&', medial 34.29 --------- LV e&', average 6.07 cm/s --------- LV E/e&', average 23.58 ---------  Ventricular septum Value Reference IVS thickness, ED 12.4 mm ---------  LVOT Value Reference LVOT ID, S 21 mm ---------  LVOT area 3.46 cm^2 --------- LVOT peak velocity, S 84.5  cm/s --------- LVOT mean velocity, S 60.3 cm/s --------- LVOT VTI, S 17.3 cm ---------  Aortic valve Value Reference Aortic valve peak velocity, S 654 cm/s --------- Aortic valve mean velocity, S 521 cm/s --------- Aortic valve VTI, S 161 cm --------- Aortic mean gradient, S 116 mm Hg --------- Aortic peak gradient, S 171 mm Hg --------- VTI ratio, LVOT/AV 0.11 --------- Aortic valve area, VTI 0.36 cm^2 --------- Aortic valve area/bsa, VTI 0.19 cm^2/m^2 --------- Velocity ratio, peak, LVOT/AV 0.13 --------- Aortic valve area, peak velocity 0.45 cm^2 --------- Aortic valve area/bsa, peak 0.24 cm^2/m^2 --------- velocity Velocity ratio, mean, LVOT/AV 0.12 --------- Aortic valve area, mean velocity 0.4 cm^2 --------- Aortic valve area/bsa, mean 0.21 cm^2/m^2 --------- velocity Aortic regurg pressure half-time 260 ms ---------  Aorta Value Reference Aortic root ID, ED 34 mm ---------  Left atrium Value Reference LA ID, A-P, ES 38 mm --------- LA ID/bsa, A-P 2.01 cm/m^2 <=2.2  Mitral valve Value Reference Mitral E-wave peak velocity 143 cm/s --------- Mitral A-wave peak velocity 173 cm/s --------- Mitral mean velocity, D 105 cm/s --------- Mitral mean gradient, D 5 mm Hg --------- Mitral peak gradient, D 8 mm Hg --------- Mitral E/A ratio, peak 0.8 --------- Mitral valve area, LVOT 1.29 cm^2 --------- continuity Mitral valve area/bsa, LVOT 0.68 cm^2/m^2 --------- continuity Mitral annulus VTI, D 42.9 cm ---------  Pulmonary arteries Value Reference PA pressure, S, DP 26 mm Hg <=30  Tricuspid valve Value Reference Tricuspid regurg peak velocity 238 cm/s --------- Tricuspid peak RV-RA gradient 23 mm Hg --------- Tricuspid maximal regurg 238 cm/s --------- velocity, PISA  Systemic veins Value Reference Estimated CVP 3 mm Hg ---------  Right ventricle Value Reference RV pressure, S, DP 26 mm Hg <=30  Legend: (L) and (H) mark values outside specified reference  range.  ------------------------------------------------------------------- Prepared and Electronically Authenticated by  Charolette Forward, MD 2015-09-01T09:52:58  CLINICAL DATA: ABDOMINAL PAIN, AAA  EXAM:  CT ANGIOGRAPHY ABDOMEN AND PELVIS  TECHNIQUE:  Multidetector CT imaging of the abdomen and pelvis was performed  using the standard protocol during bolus administration of  intravenous contrast. Multiplanar reconstructed images including  MIPs were obtained and reviewed to evaluate the vascular anatomy.  CONTRAST: 153m OMNIPAQUE IOHEXOL 350 MG/ML SOLN  COMPARISON: Ultrasound from earlier the same day  FINDINGS:  ARTERIAL FINDINGS:  Coronary calcifications.  Aorta: Mild atheromatous plaque in the ectatic and torturous mildly  tortuous visualized distal descending thoracic aorta. There is more  extensive irregular partially calcified plaque in the juxtarenal  aorta. There is a fusiform infrarenal aneurysm measuring 8.4 x 8.5  cm maximum transverse diameter, tapering to a diameter of 4.7 cm at  the bifurcation. There is a large amount of nonocclusive mural  thrombus. No adjacent inflammatory/edematous changes. No  retroperitoneal hematoma.  Celiac axis: There is an eccentric origin diverticulum which  measures approximately 10 mm diameter. There is mild short-segment  narrowing over approximately 1 cm at the level of the median arcuate  ligament of the diaphragm, patent distally.  Superior mesenteric: Calcified ostial plaque resulting in short  segment mild stenosis of doubtful hemodynamic significance. Patent  distally with classic branch anatomy.  Left renal: Single, patent  Right renal: Single. There is eccentric partially calcified plaque  extending from the origin over a length of approximately 2 cm  resulting in mild stenosis. Patent distally.  Inferior mesenteric: Short-segment origin occlusion, reconstituted  distally by visceral collaterals.  Left iliac: The origin of  the common iliac is involved by the  aneurysm, measuring 2 cm diameter, tapering to a diameter of 12 mm  at the common iliac bifurcation. Internal iliac is tortuous and  atheromatous. There is mild tortuosity of the external iliac artery  with some scattered plaque, no stenosis or aneurysm. There is origin  occlusion of the SFA, distal extent not included on the study.  Right iliac: Ectatic common iliac artery, 14 mm diameter at its  origin, tapering to 13 mm. There is scattered plaque in the internal  and external iliac arteries without high-grade stenosis or aneurysm.  Marked tortuosity of the external iliac artery may be problematic  for percutaneous access.  Venous findings: Dedicated venous phase imaging not obtained.  Review of the MIP images confirms the above findings.  Nonvascular findings: Moderately advanced emphysematous changes in  the visualized lung bases with some dependent atelectasis  posteriorly. Unremarkable arterial phase evaluation of liver,  spleen, adrenal glands, pancreas, right kidney. There is a 17 mm  low-attenuation lesion exophytic from the mid portion of the left  kidney medial to the collecting system, measuring above simple fluid  attenuation. The stomach, small bowel, and colon are nondilated.  Normal appendix. Scattered colonic diverticula most numerous in the  sigmoid segment, without adjacent inflammatory/ edematous change.  Bilateral hip arthroplasty hardware results in streak artifact  degrading portions of the scan. Urinary bladder is physiologically  distended. No ascites. No free air. No adenopathy. Degenerative disc  disease in the lumbar spine most marked L4-5 and L5-S1.  IMPRESSION:  1. 8.5 cm infrarenal abdominal aortic aneurysm extending across the  bifurcation to involve the proximal left common iliac artery. No  evidence of rupture or impending rupture. However, the size presents  will a significant risk of rupture, and vascular surgical   consultation is recommended.  2. Tortuous right iliac arterial system without aneurysm or  stenosis.  3. Origin occlusion of the left SFA.  4. 17 mm exophytic left renal mass, possibly hyperdense cyst but  incompletely characterized.  5. Colonic diverticulosis.  Electronically Signed  By: Arne Cleveland M.D.  On: 10/23/2013 19:07   STS Risk Calculator  Procedure: AV Replacement   Risk of Mortality: 4.904%  Morbidity or Mortality: 29.123%  Long Length of Stay: 15.128%  Short Length of Stay: 15.624%  Permanent Stroke: 2.742%  Prolonged Ventilation: 17.229%  DSW Infection: 0.358%  Renal Failure: 8.371%  Reoperation: 12.979%     Assessment/Plan:  He has an 8.5 cm AAA and critical aortic stenosis with non-obstructive coronary artery disease. I think the lowest risk procedure for this patient would be a combined TAVR and EVAR. A conventional open AVR followed by EVAR would have significantly higher risk than estimated by the STS risk calculator which I would estimate is greater than 15% mortality. Delaying EVAR to allow recovery from open AVR would have significant risk of rupture in the interim. I don't think there is a benefit to doing BAV followed by EVAR unless TAVR is not an option for structural/sizing reasons. A gated cardiac CT and chest CTA have been ordered to complete his workup for TAVR and once these are completed we can coordinate a plan with vascular surgery. I discussed all of the studies and plans with the patient and his daughter and they agree.  Shawn Mathews 11/01/2013, 8:00 AM

## 2013-11-01 NOTE — Progress Notes (Signed)
Subjective: No complaints, alert  Objective: Vital signs in last 24 hours: Temp:  [97.5 F (36.4 C)-98.5 F (36.9 C)] 97.9 F (36.6 C) (09/03 1214) Pulse Rate:  [63-73] 73 (09/03 1214) Resp:  [17-31] 17 (09/03 1214) BP: (105-150)/(75-95) 126/86 mmHg (09/03 1214) SpO2:  [92 %-96 %] 96 % (09/03 1214) Weight change:  Last BM Date: 10/30/13 Intake/Output from previous day: -1636 09/02 0701 - 09/03 0700 In: 2313.3 [P.O.:240; I.V.:2073.3] Out: 4750 [Urine:4750] Intake/Output this shift: Total I/O In: 765 [P.O.:240; I.V.:525] Out: 725 [Urine:725]  PE: General:Pleasant affect, NAD Skin:Warm and dry, brisk capillary refill HEENT:normocephalic, sclera clear, mucus membranes moist Neck:supple, no JVD  Heart:S1S2 RRR with 2-3/6  murmur, no gallup, rub or click Lungs:clear without rales, rhonchi, or wheezes QIO:NGEX, non tender, + BS, do not palpate liver spleen or masses Ext:no lower ext edema, 2+ pedal pulses, 2+ radial pulses Neuro:alert and oriented, MAE, follows commands, + facial symmetry   Lab Results:  Recent Labs  11/25/2013 0308 11/01/13 0257  WBC 5.5 6.6  HGB 12.4* 13.6  HCT 37.1* 40.1  PLT 71* 79*   BMET  Recent Labs  11/12/2013 0308 11/01/13 0257  NA 143 142  K 3.6* 3.9  CL 109 104  CO2 23 24  GLUCOSE 93 94  BUN 13 15  CREATININE 0.95 0.86  CALCIUM 7.8* 9.0    Recent Labs  09/30/2013 1636  TROPONINI <0.30    No results found for this basename: CHOL, HDL, LDLCALC, LDLDIRECT, TRIG, CHOLHDL   No results found for this basename: HGBA1C     No results found for this basename: TSH    Hepatic Function Panel  Recent Labs  11/01/13 0257  PROT 5.9*  ALBUMIN 3.2*  AST 20  ALT 9  ALKPHOS 48  BILITOT 0.7  BILIDIR <0.2  IBILI NOT CALCULATED   No results found for this basename: CHOL,  in the last 72 hours No results found for this basename: PROTIME,  in the last 72 hours     Studies/Results: No results found.  Medications: I  have reviewed the patient's current medications. Scheduled Meds: . sodium chloride   Intravenous Once  . allopurinol  100 mg Oral Daily  . amLODipine  5 mg Oral Daily  . docusate sodium  100 mg Oral BID  . dorzolamide  1 drop Both Eyes BID  . feeding supplement (ENSURE COMPLETE)  237 mL Oral BID BM  . nitroGLYCERIN      . pantoprazole  40 mg Oral Daily  . sodium chloride  3 mL Intravenous Q12H  . sodium chloride  3 mL Intravenous Q12H   Continuous Infusions: . sodium chloride 75 mL/hr at 11/01/13 1400   PRN Meds:.sodium chloride, sodium chloride, acetaminophen, acetaminophen, alum & mag hydroxide-simeth, guaiFENesin-dextromethorphan, hydrALAZINE, labetalol, metoprolol, morphine injection, ondansetron, phenol, sodium chloride, sodium chloride, traMADol  Assessment/Plan: Active Problems:   AAA (abdominal aortic aneurysm) without rupture    Protein-calorie malnutrition, severe    Critical aortic valve stenosis Severely calcified aortic valve  Diffuse nonobstructive coronary artery disease as detailed above (LAD 30-40%, diagonals 50-75% ostial, LCX 30-40%, RCA  there is an ulcerated-appearing nonobstructive plaque proximally with no more than 30% associated stenosis. The mid vessel has irregularity without significant obstruction. The PDA and PLA branches are widely patent.  Plan for gated cardiac CT and chest CTA are ordered.    Awaiting final decision.  ? Keep here vs. 2H vs. Tele?  LOS: 3 days   Time spent with pt. :15 minutes. [facility]  Nurse Practitioner Certified Pager [phone] or after 5pm and on weekends call [phone] 11/01/2013, 2:50 PM   Patient seen and examined. Agree with assessment and plan. No chest pain, dyspnea. Had cardiac CT; results pending. Cr stable post cath. Prob can transfer to telemetry.   Lennette Bihari, MD, [facility] 11/01/2013 3:12 PM

## 2013-11-01 NOTE — Care Management Note (Signed)
    Page 1 of 1   11/01/2013     11:53:26 AM CARE MANAGEMENT NOTE 11/01/2013  Patient:  Shawn Mathews, Shawn Mathews   Account Number:  [id number]  Date Initiated:  10/30/2013  Documentation initiated by:  Donn Pierini  Subjective/Objective Assessment:   Pt admitted with AAA-     Action/Plan:   PTA pt lived at home   Anticipated DC Date:     Anticipated DC Plan:        DC Planning Services  CM consult      Choice offered to / List presented to:             Status of service:  In process, will continue to follow Medicare Important Message given?  YES (If response is "NO", the following Medicare IM given date fields will be blank) Date Medicare IM given:  11/01/2013 Medicare IM given by:  Donn Pierini Date Additional Medicare IM given:   Additional Medicare IM given by:    Discharge Disposition:    Per UR Regulation:  Reviewed for med. necessity/level of care/duration of stay  If discussed at Long Length of Stay Meetings, dates discussed:    Comments:  11/01/13- 1150- Donn Pierini RN, BSN [phone] pt with 8.5 cm AAA and critical aortic stenosis with non-obstructive coronary artery disease. Per MD notes think the lowest risk procedure for this patient would be a combined TAVR and EVAR- surgery following to develop best plan for pt- NCM to continue to follow

## 2013-11-01 NOTE — Progress Notes (Signed)
VASCULAR LAB PRELIMINARY  PRELIMINARY  PRELIMINARY  PRELIMINARY  Pre-op Cardiac Surgery  Carotid Findings:  Right:  40-59% internal carotid artery stenosis.   Left:  60-79% internal carotid artery stenosis.   Bilateral:  Vertebral artery flow is antegrade.     Thereasa Parkin, RVT 11/01/2013 4:07 PM     Upper Extremity Right Left  Brachial Pressures    Radial Waveforms pending   Ulnar Waveforms    Palmar Arch (Allen's Test)         ABI done 11/10/13   RIGHT    LEFT     PRESSURE  WAVEFORM   PRESSURE  WAVEFORM   BRACHIAL  128  Triphasic  BRACHIAL  139  Triphasic   DP  133  Biphasic  DP  82  Monophasic   AT    AT     PT  114  Biphasic  PT  301  Monophasic   PER    PER     GREAT TOE   NA  GREAT TOE   NA     RIGHT  LEFT   ABI  0.96  2.17 (0.59 at [facility])

## 2013-11-01 NOTE — Progress Notes (Signed)
Pt transferred to 2w33 per MD order. Report called to receiving nurse and all questions answered.

## 2013-11-02 DIAGNOSIS — I359 Nonrheumatic aortic valve disorder, unspecified: Secondary | ICD-10-CM

## 2013-11-02 MED ORDER — IOHEXOL 350 MG/ML SOLN
80.0000 mL | Freq: Once | INTRAVENOUS | Status: AC | PRN
  Administered 2013-11-02: 80 mL via INTRAVENOUS

## 2013-11-02 NOTE — Progress Notes (Signed)
2 Days Post-Op Procedure(s) (LRB): LEFT HEART CATHETERIZATION WITH CORONARY ANGIOGRAM (N/A) Subjective: No complaints  Objective: Vital signs in last 24 hours: Temp:  [97.5 F (36.4 C)-98.1 F (36.7 C)] 97.7 F (36.5 C) (09/04 0700) Pulse Rate:  [73-79] 74 (09/04 0700) Cardiac Rhythm:  [-] Normal sinus rhythm (09/04 0750) Resp:  [17-18] 18 (09/04 0700) BP: (120-126)/(71-86) 126/71 mmHg (09/04 0700) SpO2:  [91 %-96 %] 91 % (09/04 0700)  Hemodynamic parameters for last 24 hours:    Intake/Output from previous day: 09/03 0701 - 09/04 0700 In: 840 [P.O.:240; I.V.:600] Out: 3600 [Urine:3600] Intake/Output this shift:    General appearance: alert and cooperative Neurologic: intact Heart: RRR, high-pitched systolic murmur Lungs: clear to auscultation bilaterally  Lab Results:  Recent Labs  11/09/2013 0308 11/01/13 0257  WBC 5.5 6.6  HGB 12.4* 13.6  HCT 37.1* 40.1  PLT 71* 79*   BMET:  Recent Labs  11/05/2013 0308 11/01/13 0257  NA 143 142  K 3.6* 3.9  CL 109 104  CO2 23 24  GLUCOSE 93 94  BUN 13 15  CREATININE 0.95 0.86  CALCIUM 7.8* 9.0    PT/INR:  Recent Labs  11/15/2013 0308  LABPROT 17.4*  INR 1.42   ABG    Component Value Date/Time   TCO2 23 10/13/2013 1722   CBG (last 3)  No results found for this basename: GLUCAP,  in the last 72 hours  Assessment/Plan:  Critical aortic stenosis and 8.5 cm AAA. Case discussed again with Dr. [name] and Dr. Cornelius Moras. We will plan to do BAV first in the OR and if that goes smoothly with adequate reduction in gradient and hemodynamic stability plan to wait to do TAVR until a later date after recovery from EVAR. This will decrease the complexity of the procedure. We will be prepared to perform TAVR at that time in case he decompensates. I have reviewed the cardiac CT and he is a candidate for a 29 mm Sapien XT. I think this would have to be put in through a transaortic route. I discussed this with the patient and he is in  agreement. Plan BAV and EVAR on Tuesday am.    LOS: 4 days    Aleyah Balik K 11/02/2013

## 2013-11-02 NOTE — Progress Notes (Signed)
VASCULAR LAB PRELIMINARY  PRELIMINARY  PRELIMINARY  PRELIMINARY  Pre-op Cardiac Surgery  Carotid Findings:  Right:  40-59% internal carotid artery stenosis.   Left:  60-79% internal carotid artery stenosis.   Bilateral:  Vertebral artery flow is antegrade.     Thereasa Parkin, RVT 11/02/2013 1:43 PM     Upper Extremity Right Left  Brachial Pressures 123 triphasic 125 triphasic  Radial Waveforms triphasic triphasic  Ulnar Waveforms triphasic triphasic  Palmar Arch (Allen's Test) WNL WNL   Bilateral palmar arch:  Doppler waveforms remain normal with ulnar and radial compressions.    ABI done 2013-11-11   RIGHT    LEFT     PRESSURE  WAVEFORM   PRESSURE  WAVEFORM   BRACHIAL  128  Triphasic  BRACHIAL  139  Triphasic   DP  133  Biphasic  DP  82  Monophasic   AT    AT     PT  114  Biphasic  PT  301  Monophasic   PER    PER     GREAT TOE   NA  GREAT TOE   NA     RIGHT  LEFT   ABI  0.96  2.17 (0.59 at [facility])

## 2013-11-02 NOTE — Progress Notes (Signed)
    Subjective:  No chest pain or shortness of breath at rest. No abdominal pain.  Objective:  Vital Signs in the last 24 hours: Temp:  [97.5 F (36.4 C)-98.1 F (36.7 C)] 97.7 F (36.5 C) (09/04 0700) Pulse Rate:  [73-79] 74 (09/04 0700) Resp:  [17-18] 18 (09/04 0700) BP: (120-126)/(71-86) 126/71 mmHg (09/04 0700) SpO2:  [91 %-96 %] 91 % (09/04 0700)  Intake/Output from previous day: 09/03 0701 - 09/04 0700 In: 840 [P.O.:240; I.V.:600] Out: 3600 [Urine:3600]  Physical Exam: Pt is alert and oriented, NAD HEENT: normal Neck: JVP - normal, carotids delayed Lungs: CTA bilaterally CV: RRR with high pitched late peaking grade 3/6 systolic murmur heard best at the apex and lateral chest, A2 is absent Abd: soft, NT Ext: no C/C/E, femoral pulses are 2+ Skin: warm/dry no rash   Lab Results:  Recent Labs  2013-11-29 0308 11/01/13 0257  WBC 5.5 6.6  HGB 12.4* 13.6  PLT 71* 79*    Recent Labs  November 29, 2013 0308 11/01/13 0257  NA 143 142  K 3.6* 3.9  CL 109 104  CO2 23 24  GLUCOSE 93 94  BUN 13 15  CREATININE 0.95 0.86   No results found for this basename: TROPONINI, CK, MB,  in the last 72 hours  Assessment/Plan:  1. Abdominal aortic aneurysm without rupture, 8.5 cm 2. Critical aortic stenosis, symptomatic 3. Nonobstructive CAD 4. Thrombocytopenia  Discussed the patient's case at length yesterday with Dr Imogene Burn and Dr Laneta Simmers. Complex decision making in this patient with a large abdominal aortic aneurysm and critical aortic stenosis. The patient otherwise is functionally independent. After careful discussion, we feel that balloon valvuloplasty combined with endovascular repair of his aneurysm is the best initial approach. As long as he does well, he would be considered for TAVR after he recovers from surgery. We have discussed they'll out strategies in case he has severe aortic insufficiency or other major complication related to balloon aortic valvuloplasty. Considerations  include emergent TAVR versus open cardiac surgery. Will review further with Dr. Laneta Simmers before the procedure. I have reviewed the risks, indications, and alternatives to balloon aortic valvuloplasty at length with the patient. Risks include but are not limited to vascular injury, stroke, cardiac perforation, tamponade, myocardial infarction, aortic dissection, aneurysm rupture, and death. The patient understands these risks and agrees to proceed. Plan for first case Tuesday morning with Dr Imogene Burn.  [name], M.D. 11/02/2013, 7:41 AM

## 2013-11-02 NOTE — Progress Notes (Signed)
Daily Progress Note  Assessment/Planning: 1. POD #2 s/p Cardiac angiogram - no evidence of CIN  2. Large AAA: 8.5 cm -> 30-50% rupture risk/year  Plan: joint Valvuloplasty/EVAR on coming Tuesday  The patient is aware the risks of endovascular aortic surgery include but are not limited to: bleeding, need for transfusion, infection, death, stroke, paralysis, wound complications, bowel ischemia, extended ventilation, anaphylactic reaction to contrast, contrast induced nephropathy, embolism, and need for additional procedure to address endoleaks.    Overall, I cited a mortality rate of 1-2% and morbidity rate of 15% for the EVAR.  Obviously, with the addition of valvuloplasty, the patient is at high risk of mortality.  I've discussed the risks over multiple discussions, and the patient is aware of the complexity of his medical problems.  Regardless of the risks, he would like to proceed with an attempt at addressing his AV and AAA.  He is also planning on proceeding with TAVR if offered to him.  I have discussed with the patient the iliac access issues.  I'm fairly confident the Gore C3 main body can be passed up the left iliac system, but I have some concerns there is too much tortuosity in the R iliac system to pass the contralateral limb.  Subsequently, I will plan on placing bilateral sheaths into the aorta before placing any graft.  If this is not success, will abort the case and re-evaluate a second attempt with a Renu graft.  I have concerns that a Renu might be too stiff to pass up the left iliac system.  3. Severe AS: plan as above  4. Mod Mitral Stenosis:  Medical mgmt  5. Non-obstructive CAD: medical mgmt  6. Protein malnutrition:  On supplementation  7. Thrombocytopenia: unclear etiology of such, was present on admission, stop Lovenox, might need platelets on day of surgery if continues  Overall, finish TAVR work-up.  If patient remains asx, ok to d/c home for the weekend per  pt's wishes   Subjective  - 2 Days Post-Op  No complaints, no abd pain  Objective Filed Vitals:   11/01/13 1558 11/01/13 1725 11/01/13 2053 11/02/13 0700  BP:  120/72 120/80 126/71  Pulse:  79 73 74  Temp: 97.5 F (36.4 C) 98.1 F (36.7 C) 98.1 F (36.7 C) 97.7 F (36.5 C)  TempSrc: Oral Oral Oral Oral  Resp:  Height:      Weight:      SpO2:  92% 91% 91%    Intake/Output Summary (Last 24 hours) at 11/02/13 0909 Last data filed at 11/02/13 0700  Gross per 24 hour  Intake    450 ml  Output   3600 ml  Net  -3150 ml    PULM  CTAB CV  RRR GI  soft, NTND, AAA palpable and non-tender VASC  Warm feet  Laboratory CBC    Component Value Date/Time   WBC 6.6 11/01/2013 0257   HGB 13.6 11/01/2013 0257   HCT 40.1 11/01/2013 0257   PLT 79* 11/01/2013 0257    BMET    Component Value Date/Time   NA 142 11/01/2013 0257   K 3.9 11/01/2013 0257   CL 104 11/01/2013 0257   CO2 24 11/01/2013 0257   GLUCOSE 94 11/01/2013 0257   BUN 15 11/01/2013 0257   CREATININE 0.86 11/01/2013 0257   CALCIUM 9.0 11/01/2013 0257   GFRNONAA 77* 11/01/2013 0257   GFRAA 90* 11/01/2013 0257    [name], MD [facility] [place] Office: [phone] Pager: [phone]  11/02/2013, 9:09 AM

## 2013-11-03 DIAGNOSIS — I714 Abdominal aortic aneurysm, without rupture, unspecified: Principal | ICD-10-CM

## 2013-11-03 DIAGNOSIS — I359 Nonrheumatic aortic valve disorder, unspecified: Secondary | ICD-10-CM

## 2013-11-03 MED ORDER — SODIUM CHLORIDE 0.9 % IV SOLN
Freq: Once | INTRAVENOUS | Status: AC
  Administered 2013-11-07: 13:00:00 via INTRAVENOUS

## 2013-11-03 NOTE — Progress Notes (Signed)
[facility] [place]  Subjective  - No abdominal pain   Objective 101/62 77 97.9 F (36.6 C) (Oral) 18 95%  Intake/Output Summary (Last 24 hours) at 11/03/13 0836 Last data filed at 11/03/13 0546  Gross per 24 hour  Intake    480 ml  Output   2450 ml  Net  -1970 ml   Abdomen soft non tender  Assessment/Planning: Aortic valve AAA repair next week Continue current care Will need type and screen on Monday  FIELDS,CHARLES E 11/03/2013 8:36 AM --  Laboratory Lab Results:  Recent Labs  11/01/13 0257  WBC 6.6  HGB 13.6  HCT 40.1  PLT 79*   BMET  Recent Labs  11/01/13 0257  NA 142  K 3.9  CL 104  CO2 24  GLUCOSE 94  BUN 15  CREATININE 0.86  CALCIUM 9.0    COAG Lab Results  Component Value Date   INR 1.42 11-29-13   No results found for this basename: PTT

## 2013-11-03 NOTE — Progress Notes (Signed)
    Subjective:  No chest pain or shortness of breath at rest. No abdominal pain.  Objective:  Vital Signs in the last 24 hours: Temp:  [97.9 F (36.6 C)-98.8 F (37.1 C)] 97.9 F (36.6 C) (09/05 0546) Pulse Rate:  [67-77] 77 (09/05 0833) Resp:  [17-18] 18 (09/05 0546) BP: (101-133)/(62-75) 101/62 mmHg (09/05 0833) SpO2:  [92 %-96 %] 95 % (09/05 0546)  Intake/Output from previous day: 09/04 0701 - 09/05 0700 In: 720 [P.O.:720] Out: 2450 [Urine:2450]  Physical Exam: Pt is alert and oriented, NAD HEENT: normal Neck: JVP - normal, carotids delayed Lungs: CTA bilaterally CV: RRR with high pitched late peaking grade 3/6 systolic murmur heard best at the apex and lateral chest, A2 is absent Abd: soft, NT Ext: no edema Skin: warm/dry no rash   Lab Results:  Recent Labs  11/01/13 0257  WBC 6.6  HGB 13.6  PLT 79*    Recent Labs  11/01/13 0257  NA 142  K 3.9  CL 104  CO2 24  GLUCOSE 94  BUN 15  CREATININE 0.86   Assessment/Plan:  1. Abdominal aortic aneurysm without rupture, 8.5 cm 2. Critical aortic stenosis, symptomatic 3. Nonobstructive CAD 4. Thrombocytopenia Plan as outlined by Dr. [name]. Plan for balloon valvuloplasty and endovascular repair of abdominal aortic aneurysm on Tuesday. Continue present medications.   Olga Millers, M.D. 11/03/2013, 8:43 AM

## 2013-11-04 NOTE — Progress Notes (Signed)
    Subjective:  No chest pain or shortness of breath at rest. No abdominal pain.  Objective:  Vital Signs in the last 24 hours: Temp:  [97.8 F (36.6 C)-98.3 F (36.8 C)] 97.8 F (36.6 C) (09/06 0525) Pulse Rate:  [68-82] 82 (09/06 0737) Resp:  [17-18] 18 (09/06 0525) BP: (110-123)/(70-84) 111/84 mmHg (09/06 0737) SpO2:  [94 %] 94 % (09/06 0525)  Intake/Output from previous day: 09/05 0701 - 09/06 0700 In: 720 [P.O.:720] Out: 1850 [Urine:1850]  Physical Exam: Pt is alert and oriented, NAD HEENT: normal Neck: JVP - normal, carotids delayed Lungs: CTA bilaterally CV: RRR with high pitched late peaking grade 3/6 systolic murmur heard best at the apex and lateral chest, A2 is absent Abd: soft, NT Ext: no edema Skin: warm/dry no rash  Assessment/Plan:  1. Abdominal aortic aneurysm without rupture, 8.5 cm 2. Critical aortic stenosis, symptomatic 3. Nonobstructive CAD 4. Thrombocytopenia Plan as outlined by Dr. [name]. Plan for balloon valvuloplasty and endovascular repair of abdominal aortic aneurysm on Tuesday. Continue present medications.   Olga Millers, M.D. 11/04/2013, 8:47 AM

## 2013-11-04 NOTE — Progress Notes (Signed)
PT encouraged to move to chair and get out of bed, pt stated "maybe later", will continue to encourage activity Archie Balboa, RN

## 2013-11-04 NOTE — Progress Notes (Addendum)
  Progress Note    11/04/2013 8:13 AM Hospital Day 7  Subjective:  Sitting in bed eating breakfast.  States he had a little bit of abdominal pain, which he can't describe.  States he took a pain pill and went right to sleep and pain is resolved.  Denies any chest pain.  Afebrile 60's-70's 100's-120's systolic 94% RA  Filed Vitals:   11/04/13 0737  BP: 111/84  Pulse: 82  Temp:   Resp:     Physical Exam:  Lungs:  Non labored Abdomen:  Soft, non tender to palpation   CBC    Component Value Date/Time   WBC 6.6 11/01/2013 0257   RBC 4.30 11/01/2013 0257   HGB 13.6 11/01/2013 0257   HCT 40.1 11/01/2013 0257   PLT 79* 11/01/2013 0257   MCV 93.3 11/01/2013 0257   MCH 31.6 11/01/2013 0257   MCHC 33.9 11/01/2013 0257   RDW 15.9* 11/01/2013 0257   LYMPHSABS 1.7 10/19/2013 1636   MONOABS 0.6 10/28/2013 1636   EOSABS 0.2 10/09/2013 1636   BASOSABS 0.0 10/28/2013 1636    BMET    Component Value Date/Time   NA 142 11/01/2013 0257   K 3.9 11/01/2013 0257   CL 104 11/01/2013 0257   CO2 24 11/01/2013 0257   GLUCOSE 94 11/01/2013 0257   BUN 15 11/01/2013 0257   CREATININE 0.86 11/01/2013 0257   CALCIUM 9.0 11/01/2013 0257   GFRNONAA 77* 11/01/2013 0257   GFRAA 90* 11/01/2013 0257    INR    Component Value Date/Time   INR 1.42 2013/11/01 0308     Intake/Output Summary (Last 24 hours) at 11/04/13 0813 Last data filed at 11/03/13 2313  Gross per 24 hour  Intake    720 ml  Output   1850 ml  Net  -1130 ml     Assessment/Plan:  80 y.o. male is with 8.5 cm AAA & critical AS Hospital Day 7  -pt with vague abdominal pain last evening resolved with pain medication.  His abdomen is soft and non tender to palpation.  Tolerating diet. -denies chest pain. -for combined aortic valve balloon valvuloplasty and EVAR on Tuesday. -thrombocytopenia-will check CBC in am   Doreatha Massed, PA-C [facility] [phone] 11/04/2013 8:13 AM    No complaints Abdomen soft non tender Procedure  Tuesday Needs to get out of bed  Fabienne Bruns, MD [facility] [place] Office: [phone] Pager: [phone]

## 2013-11-05 ENCOUNTER — Encounter (HOSPITAL_COMMUNITY): Payer: Self-pay | Admitting: Anesthesiology

## 2013-11-05 DIAGNOSIS — I4949 Other premature depolarization: Secondary | ICD-10-CM

## 2013-11-05 LAB — CBC
HCT: 39.4 % (ref 39.0–52.0)
Hemoglobin: 13.5 g/dL (ref 13.0–17.0)
MCH: 32.5 pg (ref 26.0–34.0)
MCHC: 34.3 g/dL (ref 30.0–36.0)
MCV: 94.9 fL (ref 78.0–100.0)
PLATELETS: 108 10*3/uL — AB (ref 150–400)
RBC: 4.15 MIL/uL — ABNORMAL LOW (ref 4.22–5.81)
RDW: 15.6 % — ABNORMAL HIGH (ref 11.5–15.5)
WBC: 6.8 10*3/uL (ref 4.0–10.5)

## 2013-11-05 LAB — SURGICAL PCR SCREEN
MRSA, PCR: NEGATIVE
STAPHYLOCOCCUS AUREUS: NEGATIVE

## 2013-11-05 LAB — PREPARE RBC (CROSSMATCH)

## 2013-11-05 MED ORDER — SODIUM CHLORIDE 0.9 % IV SOLN: 250.0000 mL | INTRAVENOUS | Status: DC | PRN

## 2013-11-05 MED ORDER — CHLORHEXIDINE GLUCONATE CLOTH 2 % EX PADS
6.0000 | MEDICATED_PAD | Freq: Once | CUTANEOUS | Status: AC
  Administered 2013-11-06: 6 via TOPICAL

## 2013-11-05 MED ORDER — SODIUM CHLORIDE 0.9 % IJ SOLN
3.0000 mL | INTRAMUSCULAR | Status: DC | PRN
Start: 2013-11-05 — End: 2013-11-06

## 2013-11-05 MED ORDER — SODIUM CHLORIDE 0.9 % IJ SOLN
3.0000 mL | Freq: Two times a day (BID) | INTRAMUSCULAR | Status: DC
  Administered 2013-11-05 (×2): 3 mL via INTRAVENOUS

## 2013-11-05 MED ORDER — SODIUM CHLORIDE 0.9 % IV SOLN
1.0000 mL/kg/h | INTRAVENOUS | Status: DC
Start: 2013-11-06 — End: 2013-11-06
  Administered 2013-11-06: 1 mL/kg/h via INTRAVENOUS

## 2013-11-05 MED ORDER — VANCOMYCIN HCL IN DEXTROSE 1-5 GM/200ML-% IV SOLN
1000.0000 mg | INTRAVENOUS | Status: DC
  Administered 2013-11-06: 1000 mg via INTRAVENOUS
  Filled 2013-11-05 (×2): qty 200

## 2013-11-05 NOTE — Progress Notes (Signed)
   Subjective:  Mild shortness of breath; No chest pain  Objective:   Vital Signs in the last 24 hours: Temp:  [97.7 F (36.5 C)-98.5 F (36.9 C)] 97.7 F (36.5 C) (09/07 0552) Pulse Rate:  [73-75] 73 (09/07 0552) Resp:  [18] 18 (09/07 0552) BP: (109-121)/(67-76) 121/73 mmHg (09/07 0552) SpO2:  [92 %-95 %] 92 % (09/07 0552)  Intake/Output from previous day: 09/06 0701 - 09/07 0700 In: 240 [P.O.:240] Out: 2425 [Urine:2425]  I/O since admission: -12,207  Medications: . sodium chloride   Intravenous Once  . allopurinol  100 mg Oral Daily  . amLODipine  5 mg Oral Daily  . docusate sodium  100 mg Oral BID  . dorzolamide  1 drop Both Eyes BID  . feeding supplement (ENSURE COMPLETE)  237 mL Oral BID BM  . [START ON 11-18-13] vancomycin  1,000 mg Intravenous On Call to OR        Physical Exam:   General appearance: alert, cooperative and no distress Neck: no adenopathy, no JVD, supple, symmetrical, trachea midline and thyroid not enlarged, symmetric, no tenderness/mass/nodules Lungs: clear to auscultation bilaterally Heart: RRR with occasional PVC; 3/6 SEM  Abdomen: soft, non-tender; bowel sounds normal; no masses,  no organomegaly Extremities: no edema, redness or tenderness in the calves or thighs Neurologic: Grossly normal   Rate: 88  Rhythm: normal sinus rhythm and occasional PVC's  Lab Results:  No results found for this basename: NA, K, CL, CO2, GLUCOSE, BUN, CREATININE,  in the last 72 hours  No results found for this basename: TROPONINI, CK, MB,  in the last 72 hours  Hepatic Function Panel No results found for this basename: PROT, ALBUMIN, AST, ALT, ALKPHOS, BILITOT, BILIDIR, IBILI,  in the last 72 hours No results found for this basename: INR,  in the last 72 hours BNP (last 3 results) No results found for this basename: PROBNP,  in the last 8760 hours  Lipid Panel  No results found for this basename: chol, trig, hdl, cholhdl, vldl, ldlcalc       Imaging:  No results found.    Assessment/Plan:   Active Problems:   AAA (abdominal aortic aneurysm) without rupture   Protein-calorie malnutrition, severe   Critical aortic valve stenosis  1. Abdominal aortic aneurysm without rupture, 8.5 cm  2. Critical aortic stenosis, symptomatic  3. Nonobstructive CAD  4. Thrombocytopenia  BP stable; Occasional PVC's. Will add lopressor 12.5 mg bid today. Plan as outlined by Dr. [name] with balloon valvuloplasty and endovascular repair of abdominal aortic aneurysm on Tuesday.       Lennette Bihari, MD, [facility] 11/05/2013, 8:12 AM

## 2013-11-05 NOTE — Progress Notes (Signed)
Patient expressed understanding of procedures to take place tomorrow. Consents obtained and placed in chart. Rise Paganini, RN

## 2013-11-05 NOTE — Progress Notes (Signed)
Ambulated in hallway with patient. PT ambulated approx 300 feet using front wheel walker. Patient tolerated walk well Julus Kelley, Lily Peer, RN

## 2013-11-05 NOTE — Progress Notes (Addendum)
  Progress Note    11/05/2013 5:40 AM Hospital Day 8  Subjective:  No complaints  Afebrile VSS  Filed Vitals:   11/04/13 2023  BP: 109/67  Pulse: 75  Temp: 98.5 F (36.9 C)  Resp: 18    Physical Exam:  Lungs:  Non labored Abdomen:  Soft, non tender to palpation, non distended   CBC    Component Value Date/Time   WBC 6.6 11/01/2013 0257   RBC 4.30 11/01/2013 0257   HGB 13.6 11/01/2013 0257   HCT 40.1 11/01/2013 0257   PLT 79* 11/01/2013 0257   MCV 93.3 11/01/2013 0257   MCH 31.6 11/01/2013 0257   MCHC 33.9 11/01/2013 0257   RDW 15.9* 11/01/2013 0257   LYMPHSABS 1.7 10/27/2013 1636   MONOABS 0.6 10/28/2013 1636   EOSABS 0.2 10/25/2013 1636   BASOSABS 0.0 10/10/2013 1636    BMET    Component Value Date/Time   NA 142 11/01/2013 0257   K 3.9 11/01/2013 0257   CL 104 11/01/2013 0257   CO2 24 11/01/2013 0257   GLUCOSE 94 11/01/2013 0257   BUN 15 11/01/2013 0257   CREATININE 0.86 11/01/2013 0257   CALCIUM 9.0 11/01/2013 0257   GFRNONAA 77* 11/01/2013 0257   GFRAA 90* 11/01/2013 0257    INR    Component Value Date/Time   INR 1.42 Nov 11, 2013 0308     Intake/Output Summary (Last 24 hours) at 11/05/13 0540 Last data filed at 11/04/13 2024  Gross per 24 hour  Intake    240 ml  Output   1400 ml  Net  -1160 ml     Assessment/Plan:  80 y.o. male with 8.5 cm AAA & critical AS Hospital Day 8  -pt doing well this am without complaints of abdominal pain or chest pain. --for combined aortic valve balloon valvuloplasty and EVAR tomorrow -thrombocytopenia-CBC is pending for this am -pt states he didn't mobilize much yesterday-he has had hip replacements and this is hindering him.  He will get up with assistance.     Doreatha Massed, PA-C [facility] [phone] 11/05/2013 5:40 AM    Stent graft valvuloplasty tomorrow NPO p midnight Type and screen Consent.  Fabienne Bruns, MD [facility] Office: [phone] Pager: [phone]

## 2013-11-06 ENCOUNTER — Encounter (HOSPITAL_COMMUNITY): Admission: EM | Disposition: E | Payer: Medicare Other | Source: Home / Self Care | Attending: Vascular Surgery

## 2013-11-06 ENCOUNTER — Inpatient Hospital Stay (HOSPITAL_COMMUNITY): Payer: Medicare Other | Admitting: Anesthesiology

## 2013-11-06 ENCOUNTER — Encounter (HOSPITAL_COMMUNITY): Payer: Medicare Other | Admitting: Anesthesiology

## 2013-11-06 ENCOUNTER — Inpatient Hospital Stay (HOSPITAL_COMMUNITY): Payer: Medicare Other

## 2013-11-06 DIAGNOSIS — I714 Abdominal aortic aneurysm, without rupture, unspecified: Secondary | ICD-10-CM

## 2013-11-06 DIAGNOSIS — I359 Nonrheumatic aortic valve disorder, unspecified: Secondary | ICD-10-CM

## 2013-11-06 DIAGNOSIS — E43 Unspecified severe protein-calorie malnutrition: Secondary | ICD-10-CM

## 2013-11-06 HISTORY — PX: ENDARTERECTOMY FEMORAL: SHX5804

## 2013-11-06 HISTORY — PX: ABDOMINAL AORTIC ENDOVASCULAR STENT GRAFT: SHX5707

## 2013-11-06 HISTORY — PX: BALLOON AORTIC VALVE VALVULOPLASTY: SHX6412

## 2013-11-06 LAB — POCT I-STAT, CHEM 8
BUN: 13 mg/dL (ref 6–23)
BUN: 12 mg/dL (ref 6–23)
BUN: 12 mg/dL (ref 6–23)
BUN: 13 mg/dL (ref 6–23)
BUN: 14 mg/dL (ref 6–23)
CALCIUM ION: 1.26 mmol/L (ref 1.13–1.30)
CALCIUM ION: 1.23 mmol/L (ref 1.13–1.30)
CHLORIDE: 104 meq/L (ref 96–112)
CREATININE: 0.8 mg/dL (ref 0.50–1.35)
CREATININE: 0.8 mg/dL (ref 0.50–1.35)
Calcium, Ion: 1.26 mmol/L (ref 1.13–1.30)
Calcium, Ion: 1.24 mmol/L (ref 1.13–1.30)
Calcium, Ion: 1.17 mmol/L (ref 1.13–1.30)
Chloride: 106 mEq/L (ref 96–112)
Chloride: 106 mEq/L (ref 96–112)
Chloride: 104 mEq/L (ref 96–112)
Chloride: 105 mEq/L (ref 96–112)
Creatinine, Ser: 0.7 mg/dL (ref 0.50–1.35)
Creatinine, Ser: 1 mg/dL (ref 0.50–1.35)
Creatinine, Ser: 1.3 mg/dL (ref 0.50–1.35)
GLUCOSE: 106 mg/dL — AB (ref 70–99)
GLUCOSE: 108 mg/dL — AB (ref 70–99)
GLUCOSE: 103 mg/dL — AB (ref 70–99)
Glucose, Bld: 100 mg/dL — ABNORMAL HIGH (ref 70–99)
Glucose, Bld: 115 mg/dL — ABNORMAL HIGH (ref 70–99)
HCT: 39 % (ref 39.0–52.0)
HCT: 34 % — ABNORMAL LOW (ref 39.0–52.0)
HCT: 35 % — ABNORMAL LOW (ref 39.0–52.0)
HCT: 28 % — ABNORMAL LOW (ref 39.0–52.0)
HCT: 24 % — ABNORMAL LOW (ref 39.0–52.0)
Hemoglobin: 13.3 g/dL (ref 13.0–17.0)
Hemoglobin: 11.6 g/dL — ABNORMAL LOW (ref 13.0–17.0)
Hemoglobin: 11.9 g/dL — ABNORMAL LOW (ref 13.0–17.0)
Hemoglobin: 9.5 g/dL — ABNORMAL LOW (ref 13.0–17.0)
Hemoglobin: 8.2 g/dL — ABNORMAL LOW (ref 13.0–17.0)
POTASSIUM: 3.9 meq/L (ref 3.7–5.3)
POTASSIUM: 4 meq/L (ref 3.7–5.3)
Potassium: 4 mEq/L (ref 3.7–5.3)
Potassium: 4.2 mEq/L (ref 3.7–5.3)
Potassium: 4.8 mEq/L (ref 3.7–5.3)
SODIUM: 138 meq/L (ref 137–147)
Sodium: 138 mEq/L (ref 137–147)
Sodium: 138 mEq/L (ref 137–147)
Sodium: 137 mEq/L (ref 137–147)
Sodium: 136 mEq/L — ABNORMAL LOW (ref 137–147)
TCO2: 29 mmol/L (ref 0–100)
TCO2: 26 mmol/L (ref 0–100)
TCO2: 26 mmol/L (ref 0–100)
TCO2: 24 mmol/L (ref 0–100)
TCO2: 24 mmol/L (ref 0–100)

## 2013-11-06 LAB — CBC
HCT: 39.4 % (ref 39.0–52.0)
HCT: 26.3 % — ABNORMAL LOW (ref 39.0–52.0)
HCT: 31.6 % — ABNORMAL LOW (ref 39.0–52.0)
HCT: 31.6 % — ABNORMAL LOW (ref 39.0–52.0)
HEMOGLOBIN: 8.7 g/dL — AB (ref 13.0–17.0)
Hemoglobin: 13.3 g/dL (ref 13.0–17.0)
Hemoglobin: 10.7 g/dL — ABNORMAL LOW (ref 13.0–17.0)
Hemoglobin: 10.7 g/dL — ABNORMAL LOW (ref 13.0–17.0)
MCH: 31.2 pg (ref 26.0–34.0)
MCH: 31.3 pg (ref 26.0–34.0)
MCH: 31.6 pg (ref 26.0–34.0)
MCH: 31.1 pg (ref 26.0–34.0)
MCHC: 33.8 g/dL (ref 30.0–36.0)
MCHC: 33.1 g/dL (ref 30.0–36.0)
MCHC: 33.9 g/dL (ref 30.0–36.0)
MCHC: 33.9 g/dL (ref 30.0–36.0)
MCV: 92.5 fL (ref 78.0–100.0)
MCV: 94.6 fL (ref 78.0–100.0)
MCV: 93.2 fL (ref 78.0–100.0)
MCV: 91.9 fL (ref 78.0–100.0)
Platelets: 100 10*3/uL — ABNORMAL LOW (ref 150–400)
Platelets: 67 10*3/uL — ABNORMAL LOW (ref 150–400)
Platelets: 63 10*3/uL — ABNORMAL LOW (ref 150–400)
Platelets: 68 10*3/uL — ABNORMAL LOW (ref 150–400)
RBC: 4.26 MIL/uL (ref 4.22–5.81)
RBC: 2.78 MIL/uL — ABNORMAL LOW (ref 4.22–5.81)
RBC: 3.39 MIL/uL — ABNORMAL LOW (ref 4.22–5.81)
RBC: 3.44 MIL/uL — ABNORMAL LOW (ref 4.22–5.81)
RDW: 15.2 % (ref 11.5–15.5)
RDW: 15.3 % (ref 11.5–15.5)
RDW: 15.4 % (ref 11.5–15.5)
RDW: 15.7 % — ABNORMAL HIGH (ref 11.5–15.5)
WBC: 6.5 10*3/uL (ref 4.0–10.5)
WBC: 8.1 10*3/uL (ref 4.0–10.5)
WBC: 14.7 10*3/uL — ABNORMAL HIGH (ref 4.0–10.5)
WBC: 16.5 10*3/uL — ABNORMAL HIGH (ref 4.0–10.5)

## 2013-11-06 LAB — POCT I-STAT 3, ART BLOOD GAS (G3+)
Acid-base deficit: 1 mmol/L (ref 0.0–2.0)
Acid-base deficit: 3 mmol/L — ABNORMAL HIGH (ref 0.0–2.0)
Bicarbonate: 24.7 mEq/L — ABNORMAL HIGH (ref 20.0–24.0)
Bicarbonate: 22.8 mEq/L (ref 20.0–24.0)
O2 Saturation: 99 %
O2 Saturation: 96 %
PH ART: 7.349 — AB (ref 7.350–7.450)
Patient temperature: 36.7
TCO2: 26 mmol/L (ref 0–100)
TCO2: 24 mmol/L (ref 0–100)
pCO2 arterial: 44.2 mmHg (ref 35.0–45.0)
pCO2 arterial: 44.3 mmHg (ref 35.0–45.0)
pH, Arterial: 7.319 — ABNORMAL LOW (ref 7.350–7.450)
pO2, Arterial: 156 mmHg — ABNORMAL HIGH (ref 80.0–100.0)
pO2, Arterial: 88 mmHg (ref 80.0–100.0)

## 2013-11-06 LAB — GLUCOSE, CAPILLARY
GLUCOSE-CAPILLARY: 112 mg/dL — AB (ref 70–99)
GLUCOSE-CAPILLARY: 112 mg/dL — AB (ref 70–99)
GLUCOSE-CAPILLARY: 133 mg/dL — AB (ref 70–99)
GLUCOSE-CAPILLARY: 142 mg/dL — AB (ref 70–99)
Glucose-Capillary: 100 mg/dL — ABNORMAL HIGH (ref 70–99)
Glucose-Capillary: 111 mg/dL — ABNORMAL HIGH (ref 70–99)

## 2013-11-06 LAB — BASIC METABOLIC PANEL
ANION GAP: 14 (ref 5–15)
Anion gap: 13 (ref 5–15)
BUN: 17 mg/dL (ref 6–23)
BUN: 15 mg/dL (ref 6–23)
CALCIUM: 9 mg/dL (ref 8.4–10.5)
CHLORIDE: 102 meq/L (ref 96–112)
CO2: 24 mEq/L (ref 19–32)
CO2: 21 mEq/L (ref 19–32)
Calcium: 7.4 mg/dL — ABNORMAL LOW (ref 8.4–10.5)
Chloride: 105 mEq/L (ref 96–112)
Creatinine, Ser: 0.86 mg/dL (ref 0.50–1.35)
Creatinine, Ser: 1.27 mg/dL (ref 0.50–1.35)
GFR calc Af Amer: 90 mL/min — ABNORMAL LOW (ref >90)
GFR calc Af Amer: 58 mL/min — ABNORMAL LOW (ref >90)
GFR calc non Af Amer: 77 mL/min — ABNORMAL LOW (ref >90)
GFR calc non Af Amer: 50 mL/min — ABNORMAL LOW (ref >90)
Glucose, Bld: 92 mg/dL (ref 70–99)
Glucose, Bld: 103 mg/dL — ABNORMAL HIGH (ref 70–99)
Potassium: 3.9 mEq/L (ref 3.7–5.3)
Potassium: 4.8 mEq/L (ref 3.7–5.3)
SODIUM: 139 meq/L (ref 137–147)
Sodium: 140 mEq/L (ref 137–147)

## 2013-11-06 LAB — APTT: aPTT: 61 seconds — ABNORMAL HIGH (ref 24–37)

## 2013-11-06 LAB — TROPONIN I
TROPONIN I: 3.94 ng/mL — AB (ref <0.30)
Troponin I: 2.29 ng/mL (ref <0.30)

## 2013-11-06 LAB — PROTIME-INR
INR: 1.97 — AB (ref 0.00–1.49)
PROTHROMBIN TIME: 22.4 s — AB (ref 11.6–15.2)

## 2013-11-06 LAB — PREPARE RBC (CROSSMATCH)

## 2013-11-06 SURGERY — INSERTION, ENDOVASCULAR STENT GRAFT, AORTA, ABDOMINAL
Anesthesia: General | Site: Groin | Laterality: Right

## 2013-11-06 MED ORDER — VECURONIUM BROMIDE 10 MG IV SOLR
INTRAVENOUS | Status: AC
  Filled 2013-11-06: qty 10

## 2013-11-06 MED ORDER — BISACODYL 10 MG RE SUPP: 10.0000 mg | Freq: Every day | RECTAL | Status: DC | PRN

## 2013-11-06 MED ORDER — PHENYLEPHRINE HCL 10 MG/ML IJ SOLN
30.0000 ug/min | INTRAVENOUS | Status: DC
  Filled 2013-11-06: qty 1

## 2013-11-06 MED ORDER — SODIUM CHLORIDE 0.9 % IV SOLN
500.0000 mL | Freq: Once | INTRAVENOUS | Status: AC | PRN
Start: 2013-11-06 — End: 2013-11-06

## 2013-11-06 MED ORDER — NOREPINEPHRINE BITARTRATE 1 MG/ML IV SOLN
2.0000 ug/min | INTRAVENOUS | Status: DC
  Administered 2013-11-07: 12 ug/min via INTRAVENOUS
  Filled 2013-11-06 (×3): qty 8

## 2013-11-06 MED ORDER — ARTIFICIAL TEARS OP OINT
TOPICAL_OINTMENT | OPHTHALMIC | Status: DC | PRN
  Administered 2013-11-06: 1 via OPHTHALMIC

## 2013-11-06 MED ORDER — LABETALOL HCL 5 MG/ML IV SOLN
10.0000 mg | INTRAVENOUS | Status: DC | PRN
  Administered 2013-11-06: 10 mg via INTRAVENOUS
  Filled 2013-11-06: qty 4

## 2013-11-06 MED ORDER — MORPHINE SULFATE 2 MG/ML IJ SOLN: 2.0000 mg | INTRAMUSCULAR | Status: DC | PRN

## 2013-11-06 MED ORDER — ROCURONIUM BROMIDE 50 MG/5ML IV SOLN
INTRAVENOUS | Status: AC
  Filled 2013-11-06: qty 1

## 2013-11-06 MED ORDER — ROCURONIUM BROMIDE 100 MG/10ML IV SOLN
INTRAVENOUS | Status: DC | PRN
  Administered 2013-11-06: 10 mg via INTRAVENOUS
  Administered 2013-11-06: 40 mg via INTRAVENOUS

## 2013-11-06 MED ORDER — THROMBIN 20000 UNITS EX SOLR
CUTANEOUS | Status: AC
  Filled 2013-11-06: qty 20000

## 2013-11-06 MED ORDER — HEPARIN SODIUM (PORCINE) 5000 UNIT/ML IJ SOLN
5000.0000 [IU] | Freq: Three times a day (TID) | INTRAMUSCULAR | Status: DC
Start: 2013-11-06 — End: 2013-11-06

## 2013-11-06 MED ORDER — SODIUM CHLORIDE 0.9 % IV SOLN
250.0000 [IU] | INTRAVENOUS | Status: DC | PRN
  Administered 2013-11-06: 1.2 [IU]/h via INTRAVENOUS

## 2013-11-06 MED ORDER — MIDAZOLAM HCL 2 MG/2ML IJ SOLN
INTRAMUSCULAR | Status: AC
  Filled 2013-11-06: qty 2

## 2013-11-06 MED ORDER — ALBUMIN HUMAN 5 % IV SOLN
INTRAVENOUS | Status: DC | PRN
  Administered 2013-11-06 (×4): via INTRAVENOUS

## 2013-11-06 MED ORDER — 0.9 % SODIUM CHLORIDE (POUR BTL) OPTIME
TOPICAL | Status: DC | PRN
  Administered 2013-11-06: 1000 mL

## 2013-11-06 MED ORDER — ARTIFICIAL TEARS OP OINT
TOPICAL_OINTMENT | OPHTHALMIC | Status: AC
Start: 2013-11-06 — End: 2013-11-06
  Filled 2013-11-06: qty 3.5

## 2013-11-06 MED ORDER — SODIUM CHLORIDE 0.9 % IV SOLN
INTRAVENOUS | Status: DC
  Filled 2013-11-06: qty 30

## 2013-11-06 MED ORDER — IPRATROPIUM-ALBUTEROL 0.5-2.5 (3) MG/3ML IN SOLN
3.0000 mL | Freq: Four times a day (QID) | RESPIRATORY_TRACT | Status: DC
  Administered 2013-11-06 – 2013-11-07 (×3): 3 mL via RESPIRATORY_TRACT
  Filled 2013-11-06 (×3): qty 3

## 2013-11-06 MED ORDER — LIDOCAINE HCL (CARDIAC) 20 MG/ML IV SOLN
INTRAVENOUS | Status: DC | PRN
  Administered 2013-11-06 (×3): 50 mg via INTRAVENOUS

## 2013-11-06 MED ORDER — DOPAMINE-DEXTROSE 3.2-5 MG/ML-% IV SOLN: 3.0000 ug/kg/min | INTRAVENOUS | Status: DC

## 2013-11-06 MED ORDER — MIDAZOLAM HCL 5 MG/5ML IJ SOLN
INTRAMUSCULAR | Status: DC | PRN
  Administered 2013-11-06: 2 mg via INTRAVENOUS
  Administered 2013-11-06 (×2): 1 mg via INTRAVENOUS

## 2013-11-06 MED ORDER — PANTOPRAZOLE SODIUM 40 MG PO TBEC: 40.0000 mg | DELAYED_RELEASE_TABLET | Freq: Every day | ORAL | Status: DC

## 2013-11-06 MED ORDER — ACETAMINOPHEN 325 MG PO TABS: 325.0000 mg | ORAL_TABLET | ORAL | Status: DC | PRN

## 2013-11-06 MED ORDER — FENTANYL CITRATE 0.05 MG/ML IJ SOLN
INTRAMUSCULAR | Status: AC
  Filled 2013-11-06: qty 5

## 2013-11-06 MED ORDER — GUAIFENESIN-DM 100-10 MG/5ML PO SYRP: 15.0000 mL | ORAL_SOLUTION | ORAL | Status: DC | PRN

## 2013-11-06 MED ORDER — PHENOL 1.4 % MT LIQD: 1.0000 | OROMUCOSAL | Status: DC | PRN

## 2013-11-06 MED ORDER — ONDANSETRON HCL 4 MG/2ML IJ SOLN
INTRAMUSCULAR | Status: DC | PRN
  Administered 2013-11-06: 4 mg via INTRAVENOUS

## 2013-11-06 MED ORDER — DOCUSATE SODIUM 100 MG PO CAPS: 100.0000 mg | ORAL_CAPSULE | Freq: Every day | ORAL | Status: DC

## 2013-11-06 MED ORDER — IODIXANOL 320 MG/ML IV SOLN
INTRAVENOUS | Status: DC | PRN
  Administered 2013-11-06: 30 mL via INTRA_ARTERIAL

## 2013-11-06 MED ORDER — STERILE WATER FOR INJECTION IJ SOLN
INTRAMUSCULAR | Status: AC
  Filled 2013-11-06: qty 20

## 2013-11-06 MED ORDER — ONDANSETRON HCL 4 MG/2ML IJ SOLN: 4.0000 mg | Freq: Four times a day (QID) | INTRAMUSCULAR | Status: DC | PRN

## 2013-11-06 MED ORDER — SODIUM CHLORIDE 0.9 % IV SOLN
INTRAVENOUS | Status: DC
  Administered 2013-11-06 – 2013-11-08 (×4): via INTRAVENOUS

## 2013-11-06 MED ORDER — MAGNESIUM SULFATE 50 % IJ SOLN
40.0000 meq | INTRAMUSCULAR | Status: DC
  Filled 2013-11-06: qty 10

## 2013-11-06 MED ORDER — PHENYLEPHRINE HCL 10 MG/ML IJ SOLN
30.0000 ug/min | INTRAVENOUS | Status: AC
  Administered 2013-11-06: 15 ug/min via INTRAVENOUS
  Filled 2013-11-06: qty 2

## 2013-11-06 MED ORDER — ACETAMINOPHEN 650 MG RE SUPP: 325.0000 mg | RECTAL | Status: DC | PRN

## 2013-11-06 MED ORDER — SODIUM CHLORIDE 0.9 % IV SOLN
Freq: Once | INTRAVENOUS | Status: AC
  Administered 2013-11-06: 19:00:00 via INTRAVENOUS

## 2013-11-06 MED ORDER — ALBUTEROL SULFATE (2.5 MG/3ML) 0.083% IN NEBU: 2.5000 mg | INHALATION_SOLUTION | RESPIRATORY_TRACT | Status: DC | PRN

## 2013-11-06 MED ORDER — INSULIN ASPART 100 UNIT/ML ~~LOC~~ SOLN
0.0000 [IU] | SUBCUTANEOUS | Status: DC
  Administered 2013-11-06 – 2013-11-18 (×20): 1 [IU] via SUBCUTANEOUS
  Administered 2013-11-18: 2 [IU] via SUBCUTANEOUS

## 2013-11-06 MED ORDER — FENTANYL CITRATE 0.05 MG/ML IJ SOLN
INTRAMUSCULAR | Status: DC | PRN
Start: 2013-11-06 — End: 2013-11-06
  Administered 2013-11-06: 100 ug via INTRAVENOUS
  Administered 2013-11-06: 25 ug via INTRAVENOUS
  Administered 2013-11-06 (×2): 50 ug via INTRAVENOUS
  Administered 2013-11-06: 25 ug via INTRAVENOUS
  Administered 2013-11-06: 200 ug via INTRAVENOUS
  Administered 2013-11-06: 50 ug via INTRAVENOUS

## 2013-11-06 MED ORDER — HEPARIN SODIUM (PORCINE) 1000 UNIT/ML IJ SOLN
INTRAMUSCULAR | Status: DC | PRN
  Administered 2013-11-06: 2000 [IU] via INTRAVENOUS
  Administered 2013-11-06: 6000 [IU] via INTRAVENOUS
  Administered 2013-11-06: 2000 [IU] via INTRAVENOUS

## 2013-11-06 MED ORDER — LACTATED RINGERS IV SOLN
INTRAVENOUS | Status: DC | PRN
  Administered 2013-11-06: 07:00:00 via INTRAVENOUS

## 2013-11-06 MED ORDER — FENTANYL BOLUS VIA INFUSION
25.0000 ug | INTRAVENOUS | Status: DC | PRN
  Administered 2013-11-06 (×2): 25 ug via INTRAVENOUS
  Administered 2013-11-06 – 2013-11-18 (×10): 50 ug via INTRAVENOUS
  Administered 2013-11-18: 25 ug via INTRAVENOUS
  Administered 2013-11-21: 50 ug via INTRAVENOUS
  Filled 2013-11-06 (×2): qty 50

## 2013-11-06 MED ORDER — VECURONIUM BROMIDE 10 MG IV SOLR
INTRAVENOUS | Status: DC | PRN
  Administered 2013-11-06 (×2): 1 mg via INTRAVENOUS

## 2013-11-06 MED ORDER — PROPOFOL 10 MG/ML IV BOLUS
INTRAVENOUS | Status: AC
  Filled 2013-11-06: qty 20

## 2013-11-06 MED ORDER — POTASSIUM CHLORIDE 2 MEQ/ML IV SOLN
80.0000 meq | INTRAVENOUS | Status: DC
  Filled 2013-11-06: qty 40

## 2013-11-06 MED ORDER — HYDRALAZINE HCL 20 MG/ML IJ SOLN: 10.0000 mg | INTRAMUSCULAR | Status: DC | PRN

## 2013-11-06 MED ORDER — NITROGLYCERIN IN D5W 200-5 MCG/ML-% IV SOLN
2.0000 ug/min | INTRAVENOUS | Status: DC
  Filled 2013-11-06: qty 250

## 2013-11-06 MED ORDER — METOPROLOL TARTRATE 1 MG/ML IV SOLN: 2.0000 mg | INTRAVENOUS | Status: DC | PRN

## 2013-11-06 MED ORDER — SODIUM CHLORIDE 0.9 % IV SOLN: Freq: Once | INTRAVENOUS | Status: DC

## 2013-11-06 MED ORDER — DEXMEDETOMIDINE HCL IN NACL 200 MCG/50ML IV SOLN: 0.4000 ug/kg/h | INTRAVENOUS | Status: DC

## 2013-11-06 MED ORDER — DEXMEDETOMIDINE HCL IN NACL 400 MCG/100ML IV SOLN
0.1000 ug/kg/h | INTRAVENOUS | Status: AC
  Administered 2013-11-06: 0.2 ug/kg/h via INTRAVENOUS
  Filled 2013-11-06: qty 100

## 2013-11-06 MED ORDER — SURGIFOAM 100 EX MISC
CUTANEOUS | Status: DC | PRN
  Administered 2013-11-06: 13:00:00 via TOPICAL

## 2013-11-06 MED ORDER — SODIUM CHLORIDE 0.9 % IR SOLN
Status: DC | PRN
  Administered 2013-11-06 (×3)

## 2013-11-06 MED ORDER — FENTANYL CITRATE 0.05 MG/ML IJ SOLN
0.0000 ug/h | INTRAMUSCULAR | Status: DC
  Administered 2013-11-06: 50 ug/h via INTRAVENOUS
  Administered 2013-11-07 – 2013-11-08 (×2): 200 ug/h via INTRAVENOUS
  Administered 2013-11-08: 225 ug/h via INTRAVENOUS
  Administered 2013-11-09 – 2013-11-10 (×4): 200 ug/h via INTRAVENOUS
  Administered 2013-11-11 – 2013-11-12 (×3): 250 ug/h via INTRAVENOUS
  Administered 2013-11-13: 300 ug/h via INTRAVENOUS
  Administered 2013-11-13: 200 ug/h via INTRAVENOUS
  Administered 2013-11-13 – 2013-11-14 (×3): 300 ug/h via INTRAVENOUS
  Administered 2013-11-14: 250 ug/h via INTRAVENOUS
  Administered 2013-11-15 – 2013-11-17 (×8): 300 ug/h via INTRAVENOUS
  Administered 2013-11-17: 150 ug/h via INTRAVENOUS
  Administered 2013-11-17: 300 ug/h via INTRAVENOUS
  Administered 2013-11-18: 150 ug/h via INTRAVENOUS
  Administered 2013-11-19: 225 ug/h via INTRAVENOUS
  Administered 2013-11-19: 200 ug/h via INTRAVENOUS
  Administered 2013-11-20 – 2013-11-21 (×2): 250 ug/h via INTRAVENOUS
  Administered 2013-11-21: 400 ug/h via INTRAVENOUS
  Filled 2013-11-06 (×36): qty 50

## 2013-11-06 MED ORDER — FLEET ENEMA 7-19 GM/118ML RE ENEM
1.0000 | ENEMA | Freq: Once | RECTAL | Status: AC | PRN
  Filled 2013-11-06: qty 1

## 2013-11-06 MED ORDER — FENTANYL CITRATE 0.05 MG/ML IJ SOLN: 50.0000 ug | Freq: Once | INTRAMUSCULAR | Status: DC

## 2013-11-06 MED ORDER — SENNOSIDES-DOCUSATE SODIUM 8.6-50 MG PO TABS
1.0000 | ORAL_TABLET | Freq: Every evening | ORAL | Status: DC | PRN
  Filled 2013-11-06: qty 1

## 2013-11-06 MED ORDER — PROTAMINE SULFATE 10 MG/ML IV SOLN
INTRAVENOUS | Status: DC | PRN
  Administered 2013-11-06: 30 mg via INTRAVENOUS

## 2013-11-06 MED ORDER — SODIUM CHLORIDE 0.9 % IV SOLN
INTRAVENOUS | Status: DC
  Filled 2013-11-06: qty 40

## 2013-11-06 MED ORDER — PLASMA-LYTE 148 IV SOLN
INTRAVENOUS | Status: DC
  Filled 2013-11-06: qty 2.5

## 2013-11-06 MED ORDER — DEXTROSE 5 % IV SOLN
0.5000 ug/min | INTRAVENOUS | Status: DC
  Filled 2013-11-06: qty 4

## 2013-11-06 MED ORDER — EPHEDRINE SULFATE 50 MG/ML IJ SOLN
INTRAMUSCULAR | Status: AC
  Filled 2013-11-06: qty 1

## 2013-11-06 MED ORDER — ETOMIDATE 2 MG/ML IV SOLN
INTRAVENOUS | Status: DC | PRN
  Administered 2013-11-06: 8 mg via INTRAVENOUS

## 2013-11-06 MED ORDER — LEVOFLOXACIN IN D5W 500 MG/100ML IV SOLN
500.0000 mg | INTRAVENOUS | Status: AC
  Administered 2013-11-06: 500 mg via INTRAVENOUS
  Filled 2013-11-06: qty 100

## 2013-11-06 MED ORDER — LACTATED RINGERS IV SOLN
INTRAVENOUS | Status: DC | PRN
  Administered 2013-11-06 (×2): via INTRAVENOUS

## 2013-11-06 MED ORDER — OXYCODONE-ACETAMINOPHEN 5-325 MG PO TABS: 1.0000 | ORAL_TABLET | ORAL | Status: DC | PRN

## 2013-11-06 MED ORDER — LIDOCAINE HCL (CARDIAC) 20 MG/ML IV SOLN
INTRAVENOUS | Status: AC
  Filled 2013-11-06: qty 10

## 2013-11-06 MED ORDER — ALUM & MAG HYDROXIDE-SIMETH 200-200-20 MG/5ML PO SUSP: 15.0000 mL | ORAL | Status: DC | PRN

## 2013-11-06 MED ORDER — LACTATED RINGERS IV SOLN
INTRAVENOUS | Status: DC | PRN
  Administered 2013-11-06: 08:00:00 via INTRAVENOUS

## 2013-11-06 MED ORDER — DOPAMINE-DEXTROSE 3.2-5 MG/ML-% IV SOLN
2.0000 ug/kg/min | INTRAVENOUS | Status: DC
  Filled 2013-11-06: qty 250

## 2013-11-06 MED ORDER — NOREPINEPHRINE BITARTRATE 1 MG/ML IV SOLN
0.0000 ug/min | INTRAVENOUS | Status: DC
  Filled 2013-11-06: qty 8

## 2013-11-06 MED ORDER — IODIXANOL 320 MG/ML IV SOLN
INTRAVENOUS | Status: DC | PRN
Start: 2013-11-06 — End: 2013-11-06
  Administered 2013-11-06: 150 mL via INTRA_ARTERIAL

## 2013-11-06 MED ORDER — SODIUM CHLORIDE 0.9 % IV SOLN
INTRAVENOUS | Status: DC
  Filled 2013-11-06: qty 2.5

## 2013-11-06 MED ORDER — NOREPINEPHRINE BITARTRATE 1 MG/ML IV SOLN
2.0000 ug/min | INTRAVENOUS | Status: DC
  Filled 2013-11-06: qty 4

## 2013-11-06 MED ORDER — MAGNESIUM SULFATE 40 MG/ML IJ SOLN: 2.0000 g | Freq: Every day | INTRAMUSCULAR | Status: DC | PRN

## 2013-11-06 MED ORDER — POTASSIUM CHLORIDE CRYS ER 20 MEQ PO TBCR: 20.0000 meq | EXTENDED_RELEASE_TABLET | Freq: Every day | ORAL | Status: DC | PRN

## 2013-11-06 SURGICAL SUPPLY — 154 items
ADAPTER CARDIO PERF ANTE/RETRO (ADAPTER) IMPLANT
ADH SKN CLS APL DERMABOND .7 (GAUZE/BANDAGES/DRESSINGS) ×8
ADPR PRFSN 84XANTGRD RTRGD (ADAPTER)
BAG BANDED W/RUBBER/TAPE 36X54 (MISCELLANEOUS) ×6 IMPLANT
BAG DECANTER FOR FLEXI CONT (MISCELLANEOUS) ×6 IMPLANT
BAG EQP BAND 135X91 W/RBR TAPE (MISCELLANEOUS) ×4
BAG SNAP BAND KOVER 36X36 (MISCELLANEOUS) ×15 IMPLANT
BALLN TRUE 24X4.5 (BALLOONS) ×5
BALLN TRUE 24X4.5CM (BALLOONS) ×1
BALLOON TRUE 24X4.5 (BALLOONS) ×1 IMPLANT
BLADE STERNUM SYSTEM 6 (BLADE) ×6 IMPLANT
CABLE PACING FASLOC BIEGE (MISCELLANEOUS) ×3 IMPLANT
CANISTER SUCTION 2500CC (MISCELLANEOUS) ×15 IMPLANT
CANNULA FEM VENOUS REMOTE 22FR (CANNULA) IMPLANT
CANNULA GUNDRY RCSP 15FR (MISCELLANEOUS) IMPLANT
CANNULA OPTISITE PERFUSION 16F (CANNULA) IMPLANT
CATH BEACON 5.038 65CM KMP-01 (CATHETERS) ×3 IMPLANT
CATH DIAG EXPO 6F AL2 (CATHETERS) ×3 IMPLANT
CATH DIAG EXPO 6F VENT PIG 145 (CATHETERS) ×3 IMPLANT
CATH EMB 3FR 80CM (CATHETERS) ×3 IMPLANT
CATH OMNI FLUSH .035X70CM (CATHETERS) ×3 IMPLANT
CATH VANSCH 5FR 6CM (CATHETERS) ×3 IMPLANT
CLIP TI MEDIUM 24 (CLIP) IMPLANT
CLIP TI WIDE RED SMALL 24 (CLIP) IMPLANT
CONN ST 1/4X3/8  BEN (MISCELLANEOUS)
CONN ST 1/4X3/8 BEN (MISCELLANEOUS) IMPLANT
COVER BACK TABLE 24X17X13 BIG (DRAPES) ×6 IMPLANT
COVER DOME SNAP 22 D (MISCELLANEOUS) ×12 IMPLANT
COVER MAYO STAND STRL (DRAPES) ×15 IMPLANT
COVER PROBE W GEL 5X96 (DRAPES) ×6 IMPLANT
COVER SURGICAL LIGHT HANDLE (MISCELLANEOUS) ×12 IMPLANT
CRADLE DONUT ADULT HEAD (MISCELLANEOUS) ×6 IMPLANT
DERMABOND ADVANCED (GAUZE/BANDAGES/DRESSINGS) ×4
DERMABOND ADVANCED .7 DNX12 (GAUZE/BANDAGES/DRESSINGS) ×8 IMPLANT
DEVICE CLOSURE PERCLS PRGLD 6F (VASCULAR PRODUCTS) ×6 IMPLANT
DRAIN CHANNEL 28F RND 3/8 FF (WOUND CARE) IMPLANT
DRAPE INCISE IOBAN 66X45 STRL (DRAPES) ×3 IMPLANT
DRAPE PROXIMA HALF (DRAPES) ×3 IMPLANT
DRAPE SLUSH/WARMER DISC (DRAPES) ×3 IMPLANT
DRAPE SURG IRRIG POUCH 19X23 (DRAPES) ×6 IMPLANT
DRAPE TABLE COVER HEAVY DUTY (DRAPES) ×12 IMPLANT
DRSG TEGADERM 2-3/8X2-3/4 SM (GAUZE/BANDAGES/DRESSINGS) ×12 IMPLANT
DRYSEAL FLEXSHEATH 12FR 33CM (SHEATH) ×2
DRYSEAL FLEXSHEATH 14FR 33CM (SHEATH) ×2
DRYSEAL FLEXSHEATH 18FR 33CM (SHEATH) ×2
EDWARDS SWAN GANZ 5F BIPOLAR PACING CATHETER ×2 IMPLANT
ELECT CAUTERY BLADE 6.4 (BLADE) ×6 IMPLANT
ELECT REM PT RETURN 9FT ADLT (ELECTROSURGICAL) ×24
ELECTRODE REM PT RTRN 9FT ADLT (ELECTROSURGICAL) ×16 IMPLANT
EXCLUDER TNK LEG 31MX14X17 (Endovascular Graft) ×1 IMPLANT
EXCLUDER TRUNK LEG 31MX14X17 (Endovascular Graft) ×6 IMPLANT
FEMORAL VENOUS CANN RAP (CANNULA) IMPLANT
GAUZE SPONGE 2X2 8PLY STRL LF (GAUZE/BANDAGES/DRESSINGS) ×4 IMPLANT
GAUZE SPONGE 4X4 12PLY STRL (GAUZE/BANDAGES/DRESSINGS) ×6 IMPLANT
GAUZE SPONGE 4X4 16PLY XRAY LF (GAUZE/BANDAGES/DRESSINGS) ×3 IMPLANT
GLOVE BIO SURGEON STRL SZ7 (GLOVE) ×9 IMPLANT
GLOVE BIOGEL PI IND STRL 6 (GLOVE) ×1 IMPLANT
GLOVE BIOGEL PI IND STRL 7.5 (GLOVE) ×8 IMPLANT
GLOVE BIOGEL PI INDICATOR 6 (GLOVE) ×2
GLOVE BIOGEL PI INDICATOR 7.5 (GLOVE) ×10
GLOVE ECLIPSE 7.5 STRL STRAW (GLOVE) ×9 IMPLANT
GLOVE ECLIPSE 8.0 STRL XLNG CF (GLOVE) ×6 IMPLANT
GLOVE EUDERMIC 7 POWDERFREE (GLOVE) ×6 IMPLANT
GLOVE ORTHO TXT STRL SZ7.5 (GLOVE) ×3 IMPLANT
GOWN STRL REUS W/ TWL LRG LVL3 (GOWN DISPOSABLE) ×25 IMPLANT
GOWN STRL REUS W/ TWL XL LVL3 (GOWN DISPOSABLE) ×28 IMPLANT
GOWN STRL REUS W/TWL LRG LVL3 (GOWN DISPOSABLE) ×42
GOWN STRL REUS W/TWL XL LVL3 (GOWN DISPOSABLE) ×42
GRAFT BALLN CATH 65CM (STENTS) ×1 IMPLANT
GRAFT EXCLUDER LEG 14.5X12 (Endovascular Graft) ×3 IMPLANT
GUIDEWIRE ANGLED .035X150CM (WIRE) ×3 IMPLANT
GUIDEWIRE SAF TJ AMPL .035X180 (WIRE) ×3 IMPLANT
GUIDEWIRE SAFE TJ AMPLATZ EXST (WIRE) ×3 IMPLANT
GUIDEWIRE STRAIGHT .035 260CM (WIRE) ×3 IMPLANT
GUIDEWIRE WHOLEY HI TOR 145CM (WIRE) ×3 IMPLANT
KIT BASIN OR (CUSTOM PROCEDURE TRAY) ×12 IMPLANT
KIT ENCORE 26 ADVANTAGE (KITS) ×3 IMPLANT
KIT ROOM TURNOVER OR (KITS) ×12 IMPLANT
KIT SUCTION CATH 14FR (SUCTIONS) ×15 IMPLANT
LEAD PACING MYOCARDI (MISCELLANEOUS) ×6 IMPLANT
LOMA VISTA MEDICAL TRUE DILATATION BALLOON VAVULOPLASTY CATHETER ×3 IMPLANT
NDL PERC 18GX7CM (NEEDLE) ×3 IMPLANT
NEEDLE PERC 18GX7CM (NEEDLE) ×6 IMPLANT
NS IRRIG 1000ML POUR BTL (IV SOLUTION) ×36 IMPLANT
PACK AORTA (CUSTOM PROCEDURE TRAY) ×12 IMPLANT
PAD ARMBOARD 7.5X6 YLW CONV (MISCELLANEOUS) ×24 IMPLANT
PAD ELECT DEFIB RADIOL ZOLL (MISCELLANEOUS) ×6 IMPLANT
PATCH VASCULAR VASCU GUARD 1X6 (Vascular Products) ×3 IMPLANT
PENCIL BUTTON HOLSTER BLD 10FT (ELECTRODE) ×3 IMPLANT
PERCLOSE PROGLIDE 6F (VASCULAR PRODUCTS) ×36
PROTECTION STATION PRESSURIZED (MISCELLANEOUS) ×6
SET MICROPUNCTURE 5F STIFF (MISCELLANEOUS) ×3 IMPLANT
SHEATH AVANTI 11CM 8FR (MISCELLANEOUS) ×3 IMPLANT
SHEATH BRITE TIP 8FR 23CM (MISCELLANEOUS) ×3 IMPLANT
SHEATH DRYSEAL FLEX 12FR 33CM (SHEATH) ×1 IMPLANT
SHEATH DRYSEAL FLEX 14FR 33CM (SHEATH) ×1 IMPLANT
SHEATH DRYSEAL FLEX 18FR 33CM (SHEATH) ×1 IMPLANT
SHEATH PINNACLE 6F 10CM (SHEATH) ×9 IMPLANT
SPONGE GAUZE 2X2 STER 10/PKG (GAUZE/BANDAGES/DRESSINGS) ×2
SPONGE GAUZE 4X4 12PLY STER LF (GAUZE/BANDAGES/DRESSINGS) ×3 IMPLANT
SPONGE SURGIFOAM ABS GEL 100 (HEMOSTASIS) ×12 IMPLANT
STAPLER VISISTAT 35W (STAPLE) IMPLANT
STATION PROTECTION PRESSURIZED (MISCELLANEOUS) ×1 IMPLANT
STENT GRAFT BALLN CATH 65CM (STENTS) ×2
STOPCOCK MORSE 400PSI 3WAY (MISCELLANEOUS) ×9 IMPLANT
SUT BONE WAX W31G (SUTURE) ×6 IMPLANT
SUT ETHIBOND 2 0 SH (SUTURE) ×12 IMPLANT
SUT ETHIBOND X763 2 0 SH 1 (SUTURE) ×6 IMPLANT
SUT ETHILON 3 0 PS 1 (SUTURE) IMPLANT
SUT MNCRL AB 3-0 PS2 18 (SUTURE) ×6 IMPLANT
SUT MNCRL AB 4-0 PS2 18 (SUTURE) ×12 IMPLANT
SUT PROLENE 3 0 SH1 36 (SUTURE) ×3 IMPLANT
SUT PROLENE 4 0 RB 1 (SUTURE) ×6
SUT PROLENE 4-0 RB1 .5 CRCL 36 (SUTURE) ×7 IMPLANT
SUT PROLENE 5 0 C 1 24 (SUTURE) IMPLANT
SUT PROLENE 5 0 C 1 36 (SUTURE) ×9 IMPLANT
SUT PROLENE 6 0 BV (SUTURE) ×9 IMPLANT
SUT PROLENE 6 0 C 1 24 (SUTURE) ×12 IMPLANT
SUT PROLENE 6 0 C 1 30 (SUTURE) ×12 IMPLANT
SUT PROLENE 7 0 BV1 MDA (SUTURE) ×3 IMPLANT
SUT SILK  1 MH (SUTURE) ×2
SUT SILK 1 MH (SUTURE) ×7 IMPLANT
SUT SILK 2 0 SH CR/8 (SUTURE) ×6 IMPLANT
SUT SILK 2 0SH CR/8 30 (SUTURE) ×3 IMPLANT
SUT SILK 3 0 SH CR/8 (SUTURE) ×6 IMPLANT
SUT TEM PAC WIRE 2 0 SH (SUTURE) IMPLANT
SUT VIC AB 2-0 CTX 36 (SUTURE) ×9 IMPLANT
SUT VIC AB 2-0 UR6 27 (SUTURE) IMPLANT
SUT VIC AB 3-0 SH 27 (SUTURE) ×12
SUT VIC AB 3-0 SH 27X BRD (SUTURE) ×2 IMPLANT
SUT VIC AB 3-0 SH 8-18 (SUTURE) IMPLANT
SYR 20CC LL (SYRINGE) ×12 IMPLANT
SYR 30ML LL (SYRINGE) IMPLANT
SYR 50ML SLIP (SYRINGE) ×3 IMPLANT
SYR BULB IRRIGATION 50ML (SYRINGE) ×3 IMPLANT
SYR MEDRAD MARK V 150ML (SYRINGE) ×12 IMPLANT
SYRINGE 10CC LL (SYRINGE) ×24 IMPLANT
SYRINGE 3CC LL L/F (MISCELLANEOUS) ×3 IMPLANT
SYRINGE 60CC LL (MISCELLANEOUS) ×3 IMPLANT
SYSTEM SAHARA CHEST DRAIN ATS (WOUND CARE) ×3 IMPLANT
TAPE CLOTH SURG 4X10 WHT LF (GAUZE/BANDAGES/DRESSINGS) ×3 IMPLANT
TOWEL NATURAL 10PK STERILE (DISPOSABLE) ×3 IMPLANT
TOWEL OR 17X24 6PK STRL BLUE (TOWEL DISPOSABLE) ×33 IMPLANT
TOWEL OR 17X26 10 PK STRL BLUE (TOWEL DISPOSABLE) ×33 IMPLANT
TRANSDUCER W/STOPCOCK (MISCELLANEOUS) ×6 IMPLANT
TRAY FOLEY CATH 16FRSI W/METER (SET/KITS/TRAYS/PACK) ×6 IMPLANT
TUBING ART PRESS 72  MALE/FEM (TUBING) ×2
TUBING ART PRESS 72 MALE/FEM (TUBING) ×1 IMPLANT
TUBING HIGH PRESSURE 120CM (CONNECTOR) ×6 IMPLANT
UNDERPAD 30X30 INCONTINENT (UNDERPADS AND DIAPERS) ×6 IMPLANT
WATER STERILE IRR 1000ML POUR (IV SOLUTION) ×12 IMPLANT
WIRE AMPLATZ SS-J .035X180CM (WIRE) ×6 IMPLANT
WIRE BENTSON .035X145CM (WIRE) ×9 IMPLANT
WIRE MICRO SET SILHO 5FR 7 (SHEATH) ×3 IMPLANT

## 2013-11-06 NOTE — Interval H&P Note (Signed)
History and Physical Interval Note:  12/03/13 6:49 AM  Shawn Mathews  has presented today for surgery, with the diagnosis of Abdominal aneurysm without mention of rupture   The various methods of treatment have been discussed with the patient and family. After consideration of risks, benefits and other options for treatment, the patient has consented to  Procedure(s) with comments: ABDOMINAL AORTIC ENDOVASCULAR STENT GRAFT VERSUS OPEN REPAIR (N/A) BALLOON AORTIC VALVE VALVULOPLASTY (N/A) - Jamesen Stahnke will start first TRANSCATHETER AORTIC VALVE REPLACEMENT, TRANSAORTIC (N/A) as a surgical intervention .  The patient's history has been reviewed, patient examined, no change in status, stable for surgery.  I have reviewed the patient's chart and labs.  Questions were answered to the patient's satisfaction.    Plans as outlined above. Reviewed once again with patient and he understands. All questions answered. No change in symptoms since this note.   [name] MD

## 2013-11-06 NOTE — Progress Notes (Signed)
NUTRITION FOLLOW UP  DOCUMENTATION CODES Per approved criteria  -Severe malnutrition in the context of acute illness or injury   INTERVENTION:  Recommend initiation of nutrition support in next 24-48 hours if prolonged intubation expected RD to follow for nutrition care plan  NUTRITION DIAGNOSIS: Malnutrition related to inadequate oral intake as evidenced by moderate depletion of muscle mass and 7% weight loss in 1 month, ongoing  Goal: Pt to meet >/= 90% of their estimated nutrition needs, currently unmet  Monitor:  Nutrition support initiation, respiratory status, weight, labs, I/O's  ASSESSMENT: 80 yo patient sent to hospital by Dr. Shana Chute for murmur and palpable abdominal mass. Patient found to have 8cm AAA.   9/1:  Patient reports that he has lost weight over the past month due to poor appetite with abdominal pain. Since admission, he has been eating 100% of meals.  Patient transferred to 2S-SICU from 2W-Cardiac post-op.  Patient s/p procedure 9/8: INTRODUCTION OF LEFT CONTRALATERAL LIMB OF EVAR LEFT COMMON FEMORAL ARTERY  Patient is currently intubated on ventilator support MV: 9.2 L/min Temp (24hrs), Avg:98.1 F (36.7 C), Min:97.9 F (36.6 C), Max:98.2 F (36.8 C)   Height: Ht Readings from Last 1 Encounters:  2013-12-03  (1.778 m)    Weight: Wt Readings from Last 1 Encounters:  03-Dec-2013 155 lb 10.3 oz (70.6 kg)    BMI:  Body mass index is 22.33 kg/(m^2).  Re-estimated Nutritional Needs: Kcal: 1550-1700 Protein: 100-110 gm Fluid: per MD  Skin: Intact  Diet Order: NPO   Intake/Output Summary (Last 24 hours) at 12-03-2013 1534 Last data filed at 12-03-13 1430  Gross per 24 hour  Intake   4534 ml  Output    965 ml  Net   3569 ml    Labs:   Recent Labs Lab 11/16/2013 0308 11/01/13 0257 2013/12/03 0432  2013/12/03 1009 12-03-13 1209 03-Dec-2013 1354  NA 143 142 140  < > 138 137 136*  K 3.6* 3.9 3.9  < > 4.0 4.2 4.8  CL 109 104 102  < >  104 104 105  CO2 --   --   --   --   BUN < > CREATININE 0.95 0.86 0.86  < > 0.70 1.00 1.30  CALCIUM 7.8* 9.0 9.0  --   --   --   --   GLUCOSE 93 94 92  < > 106* 103* 115*  < > = values in this interval not displayed.  Scheduled Meds: . sodium chloride   Intravenous Once  . allopurinol  100 mg Oral Daily  . amLODipine  5 mg Oral Daily  . [START ON 11/07/2013] docusate sodium  100 mg Oral Daily  . dorzolamide  1 drop Both Eyes BID  . feeding supplement (ENSURE COMPLETE)  237 mL Oral BID BM  . fentaNYL  50 mcg Intravenous Once  . heparin  5,000 Units Subcutaneous 3 times per day  . pantoprazole  40 mg Oral Daily    Continuous Infusions: . sodium chloride    . dexmedetomidine    . fentaNYL infusion INTRAVENOUS    . norepinephrine (LEVOPHED) Adult infusion    . phenylephrine (NEO-SYNEPHRINE) Adult infusion      Past Medical History  Diagnosis Date  . Hypertension   . Gout   . Glaucoma   . COPD (chronic obstructive pulmonary disease)     Past Surgical History  Procedure Laterality Date  . Total hip  arthroplasty    . Lumbar disc surgery      Maureen Chatters, RD, LDN Pager #: [phone] After-Hours Pager #: [phone]

## 2013-11-06 NOTE — Anesthesia Preprocedure Evaluation (Addendum)
Anesthesia Evaluation  Patient identified by MRN, date of birth, ID band Patient awake    Reviewed: Allergy & Precautions, H&P , NPO status , Patient's Chart, lab work & pertinent test results, reviewed documented beta blocker date and time   Airway Mallampati: II TM Distance: >3 FB Neck ROM: Full    Dental  (+) Teeth Intact   Pulmonary COPDformer smoker,    Pulmonary exam normal       Cardiovascular hypertension, + Peripheral Vascular Disease + Valvular Problems/Murmurs AS     Neuro/Psych    GI/Hepatic   Endo/Other    Renal/GU      Musculoskeletal   Abdominal Normal abdominal exam  (+)   Peds  Hematology   Anesthesia Other Findings   Reproductive/Obstetrics                          Anesthesia Physical Anesthesia Plan  ASA: IV  Anesthesia Plan: General   Post-op Pain Management:    Induction: Intravenous  Airway Management Planned: Oral ETT  Additional Equipment: Arterial line, CVP, PA Cath, 3D TEE and Ultrasound Guidance Line Placement  Intra-op Plan:   Post-operative Plan: Possible Post-op intubation/ventilation  Informed Consent: I have reviewed the patients History and Physical, chart, labs and discussed the procedure including the risks, benefits and alternatives for the proposed anesthesia with the patient or authorized representative who has indicated his/her understanding and acceptance.   Dental advisory given  Plan Discussed with: CRNA, Anesthesiologist and Surgeon  Anesthesia Plan Comments:         Anesthesia Quick Evaluation

## 2013-11-06 NOTE — Consult Note (Signed)
PULMONARY / CRITICAL CARE MEDICINE   Name: Shawn Mathews MRN: [id number] DOB: February 18, 1928    ADMISSION DATE:  10/27/2013 CONSULTATION DATE:  9/8  REFERRING MD :  Imogene Burn  CHIEF COMPLAINT:  Post op vent management  INITIAL PRESENTATION: 80 y/o male admitted on 9/8 with abdominal pain felt to be due to AAA, found to have critical symptomatic aortic stenosis.  Underwent endovascular AAA repair on 9/8 with valvuloplasty so PCCM consulted for post op vent management.  STUDIES:  8/31 CT angio AB > AAA 8.5 cm, tortuos R iliac arterial system, occluded SFA, 17 mm renal mass left 9/4 CT angio heart/chest> emphysema, calcified mitral and aortic valves, calcified trileaflet aortic valve  SIGNIFICANT EVENTS: 9/8 Aortic valvuloplasty, bilat common fem artery cannulation, R iliofemoral endarterectomy, repai left common femoral artery, aortogram, repair of aorta with bifurcated prosthesis, aortic valvuloplasty   HISTORY OF PRESENT ILLNESS:  80 y/o male admitted on 9/8 with abdominal pain felt to be due to AAA, found to have critical symptomatic aortic stenosis.  Underwent endovascular AAA repair on 9/8 with valvuloplasty so PCCM consulted for post op vent management. Further history could not be obtained due to intubation.  PAST MEDICAL HISTORY :  Past Medical History  Diagnosis Date  . Hypertension   . Gout   . Glaucoma   . COPD (chronic obstructive pulmonary disease)    Past Surgical History  Procedure Laterality Date  . Total hip arthroplasty    . Lumbar disc surgery     Prior to Admission medications   Medication Sig Start Date End Date Taking? Authorizing Provider  allopurinol (ZYLOPRIM) 100 MG tablet Take by mouth daily.   Yes Historical Provider, MD  amLODipine (NORVASC) 5 MG tablet Take 5 mg by mouth daily.   Yes Historical Provider, MD  dorzolamide (TRUSOPT) 2 % ophthalmic solution Place 1 drop into both eyes 2 (two) times daily.  10/11/13  Yes Historical Provider, MD  traMADol (ULTRAM)  50 MG tablet Take 50 mg by mouth every 6 (six) hours as needed for moderate pain.  10/24/13  Yes Historical Provider, MD   Allergies  Allergen Reactions  . Penicillins     unknown    FAMILY HISTORY:  Family History  Problem Relation Age of Onset  . Diabetes Brother   . Coronary artery disease Brother   . Stroke Brother   . Diabetes Mother    SOCIAL HISTORY:  reports that he quit smoking about 27 years ago. He does not have any smokeless tobacco history on file. He reports that he does not drink alcohol or use illicit drugs.  REVIEW OF SYSTEMS:  Cannot obtain due to intubation  SUBJECTIVE:   VITAL SIGNS: Temp:  [98.1 F (36.7 C)-98.2 F (36.8 C)] 98.1 F (36.7 C) (09/08 1500) Pulse Rate:  [70-83] 70 (09/08 1500) Resp:  [12-19] 12 (09/08 1500) BP: (83-136)/(57-92) 89/66 mmHg (09/08 1500) SpO2:  [93 %-100 %] 99 % (09/08 1500) Arterial Line BP: (106-111)/(52-59) 106/52 mmHg (09/08 1500) FiO2 (%):  [50 %] 50 % (09/08 1500) Weight:  [70.6 kg (155 lb 10.3 oz)-70.716 kg (155 lb 14.4 oz)] 70.6 kg (155 lb 10.3 oz) (09/08 1500) HEMODYNAMICS: PAP: (32-37)/(14-18) 32/14 mmHg VENTILATOR SETTINGS: Vent Mode:  [-] PRVC FiO2 (%):  [50 %] 50 % Set Rate:  [12 bmp] 12 bmp Vt Set:  [730 mL] 730 mL PEEP:  [5 cmH20] 5 cmH20 Pressure Support:  [10 cmH20] 10 cmH20 INTAKE / OUTPUT:  Intake/Output Summary (Last 24 hours) at Nov 17, 2013  1528 Last data filed at 11/13/2013 1430  Gross per 24 hour  Intake   4534 ml  Output    965 ml  Net   3569 ml    PHYSICAL EXAMINATION: General:  Sedated on vent HEENT: NCAT, PERRL, ETT in place PULM: Rhonchi bilaterally CV: RRR, distant heart sounds, apex murmur AB: BS infrequent, soft, nontender Ext: cool, minimal edema Neuro: sedated on vent  LABS:  CBC  Recent Labs Lab 11/05/13 0512 11/13/2013 0432  11/17/2013 1209 11/07/2013 1350 11/03/2013 1354  WBC 6.8 6.5  --   --  8.1  --   HGB 13.5 13.3  < > 9.5* 8.7* 8.2*  HCT 39.4 39.4  < > 28.0* 26.3*  24.0*  PLT 108* 100*  --   --  67*  --   < > = values in this interval not displayed. Coag's  Recent Labs Lab 11-17-2013 0308  INR 1.42   BMET  Recent Labs Lab 11-17-2013 0308 11/01/13 0257 11/26/2013 0432  11/14/2013 1009 11/23/2013 1209 11/04/2013 1354  NA 143 142 140  < > 138 137 136*  K 3.6* 3.9 3.9  < > 4.0 4.2 4.8  CL 109 104 102  < > 104 104 105  CO2 --   --   --   --   BUN < > CREATININE 0.95 0.86 0.86  < > 0.70 1.00 1.30  GLUCOSE 93 94 92  < > 106* 103* 115*  < > = values in this interval not displayed. Electrolytes  Recent Labs Lab 11/17/13 0308 11/01/13 0257 11/12/2013 0432  CALCIUM 7.8* 9.0 9.0   Sepsis Markers No results found for this basename: LATICACIDVEN, PROCALCITON, O2SATVEN,  in the last 168 hours ABG  Recent Labs Lab 11/04/2013 1214  PHART 7.349*  PCO2ART 44.2  PO2ART 156.0*   Liver Enzymes  Recent Labs Lab 11/01/13 0257  AST 20  ALT 9  ALKPHOS 48  BILITOT 0.7  ALBUMIN 3.2*   Cardiac Enzymes No results found for this basename: TROPONINI, PROBNP,  in the last 168 hours Glucose No results found for this basename: GLUCAP,  in the last 168 hours  Imaging No results found.   ASSESSMENT / PLAN:  PULMONARY OETT 9/8 >> A: COPD Remains ventilated post operatively P:   -full vent support -duoneb scheduled  CARDIOVASCULAR CVL R IJ introducer/RHC A: Shock> hemorrhagic? Sedation related? Cardiogenic?  P:  -stat echo considering valvuloplasty -use levophed alone -monitor CVP -monitor h/h -troponin -12 lead  RENAL A:  CKD at baseline P:   -monitor urine output, BMET  GASTROINTESTINAL A:  No acute issues P:   -OG tube -TF 9/9 if not extubated  HEMATOLOGIC A:  Minor bleeding from groin site in OR, now s/p PRBC thrombocytopenia P:  -Transfusion goal < 7gm/dL unless troponin positive - Platelet transfusion if < 50K if actively bleeding  INFECTIOUS A:  No acute issues P:   BCx2 none UC  none Sputum none Abx: none  ENDOCRINE A:  Mild hyperglycemia, now on insulin gtt but no history of DM2 P:   -monitor CBG, stop insulin gtt  NEUROLOGIC A:  Pain control post surgery Sedation for vent synchrony P:   RASS goal: -1 Fentanyl gtt  TODAY'S SUMMARY: post op from endovascular AAA repair and aortic valvuloplasty; now with minor bleeding, some shock post op (hopefully sedation related). Plan support on vent, monitor CBC, stat echo considering shock, hopefully extubation 9/9 AM  I have personally obtained a history, examined the patient, evaluated laboratory and imaging results, formulated the assessment and plan and placed orders. CRITICAL CARE: The patient is critically ill with multiple organ systems failure and requires high complexity decision making for assessment and support, frequent evaluation and titration of therapies, application of advanced monitoring technologies and extensive interpretation of multiple databases. Critical Care Time devoted to patient care services described in this note is 55 minutes.   Heber Evening Shade, MD [facility] Pager: [phone] Cell: [phone] If no response, call [phone]   11-15-13, 3:28 PM

## 2013-11-06 NOTE — Progress Notes (Signed)
CRITICAL VALUE ALERT  Critical value received:  Troponin  2.29   Date of notification: 11/02/2013    Time of notification:  1800  Critical value read back:yes  Nurse who received alert: Christell Steinmiller   MD notified (1st page):  CCM Dr. Marin Shutter   Time of first page:  1800  MD notified (2nd page): Dr. Myra Gianotti  Time of second page: 1815  Responding MD:  Dr. Marin Shutter and Dr Myra Gianotti  Time MD responded: 1800 and 1815

## 2013-11-06 NOTE — Op Note (Signed)
    NAME: KHRISTIAN PHILLIPPI    MRN: [id number] DOB: Oct 25, 1927    DATE OF OPERATION: 11/06/2013  PREOP DIAGNOSIS: abdominal aortic aneurysm  POSTOP DIAGNOSIS: same  PROCEDURE: introduction of left contralateral limb of EVAR and closure left common femoral artery  CO-SURGEONS: Di Kindle. Edilia Bo, MD, [name], MD  ANESTHESIA: Gen.   EBL: per anesthesia records.  INDICATIONS: UNKNOWN SCHLEYER is a 80 y.o. male who presented with a large abdominal aortic aneurysm. He presents for elective aortic valve angioplasty by Dr. [name] and EVAR.  TECHNIQUE: Patient was taken to the operating room and received a general anesthetic. The left common femoral artery had been preclosed under ultrasound guidance with the initial Perclose device rotated 15 medially and the lateral Perclose device rotated 15 laterally. A long 12 French sheath had been placed by the cardiologist for the aortic valve procedure. At the completion of this procedure, the balloon could not be retracted into the sheath. The majority of the balloon was retracted into the sheath but there was a small amount external to the sheath and therefore with the wire remaining in place this was brought through the artery and then a 14 French long sheath was placed through the left common femoral artery which had been preclosed as described above.  The trunk ipsilateral component and right common femoral artery exposure are dictated separately by Dr. [name]. Once the trunk ipsilateral component had been deployed the contralateral gate was cannulated using an angled Glidewire and and a guide catheter. The catheter was then advanced into the graft itself and the wire removed. The catheter was turned to be sure that we were in fact intraluminal. The Amplatz wire was then advanced through the contralateral limb which had been positioned on the patient's right side anteriorly. The 14 French sheath was then advanced through the contralateral gate using the  dilator. The Amplatz wire in place and with a marker pin in place prior to advancing the sheath, a retrograde left iliac arteriograms obtained in order to demonstrate the position of the left hypogastric artery. A 14.5 mm x 12 cm device was selected and this was positioned into the contralateral gate. It was 3 cm of overlap into the contralateral gate. The sheath was then retracted. The contralateral gate was deployed without difficulty.   After the ipsilateral gate was deployed the proximal attachment site and overlap regions in addition to the distal left limb of the graft was ballooned with a Q-50 aortic balloon.   At the completion of the procedure the sheath was removed from the left groin and the previously placed Perclose sutures were secured with good hemostasis the wire was then removed and the sutures secured. There was some oozing which stopped with reversal of the protamine. In addition, there was a left femoral vein sheath which had been placed for the pacemaking wires. This was removed and pressure held for hemostasis. No immediate complications were noted.  Waverly Ferrari, MD, FACS [facility]  DATE OF DICTATION:   11/07/2013

## 2013-11-06 NOTE — Op Note (Addendum)
   Cardiac Catheterization Procedure Note  Name: Shawn Mathews MRN: [id number] DOB: Dec 29, 1927  Procedure: Left Heart Cath, temporary transvenous pacemaker pacemaker, balloon aortic valvuloplasty  Indication: Critical AS, bridging procedure as part of EVAR to treat large AAA (8.5 cm). Pt with very critical AS (mean gradient > 100 mmHg).    Operator: Dr [name]  Assisting: Dr Laneta Simmers  Procedural details: Both groins was prepped, draped, and anesthetized with 1% lidocaine. Arterial access was obtained by Dr Imogene Burn of vascular surgery. The left femoral artery was preclosed with 2 devices. Left femoral venous access was obtained. Attempts were made to preclose the right femoral artery but this was unsuccessful. Wire access to the vessel was lost and a complex repair was performed by Dr Imogene Burn. Please see his full operative note for details. After a femoral cutdown, arterial access was obtained on the right side. A temporary transvenous pacemaker was placed in the RV apex without difficulty. Pacing threshold was less than 1 mA. Using a straight tip wire direct by an AL2 catheter, the aortic valve was crossed without difficulty. A pigtail catheter was used to record simultaneous LV and Ao pressures. This catheter was changed out for an Amplatz Extra-Stiff wire which was positioned appropriately in the LV apex. A 24 mm True dilatation balloon was used to perform a single dilatation across the aortic valve. The mean transvalvular gradient was reduced from a baseline of 72 mm Hg to 38 mm Hg post-procedure. AI was slightly worse (2+) following BAV (1+ baseline). Aortic valve area improved from 0.5 square cm at baseline to 0.9 square cm post-valvuloplasty. I felt the result was acceptable and should allow the patient to continue through further treatment of his AAA. There were no immediate procedural complications. The EVAR procedure will be continued by Dr Imogene Burn. Please see his note for further details.  Procedural  Findings: Hemodynamics:  Final Conclusions:  Successful balloon aortic valvuloplasty as detailed above.   Recommendations: repeat 2D Echo tomorrow. Will follow clinically and determine timing of definitive treatment of his aortic valve disease.  [name] MD 11/19/2013, 11:04 AM

## 2013-11-06 NOTE — Progress Notes (Signed)
*  PRELIMINARY RESULTS* Echocardiogram TEE has been performed.  Shawn Mathews 11/13/2013, 11:32 AM

## 2013-11-06 NOTE — Op Note (Signed)
OPERATIVE NOTE   PROCEDURE:  1. Bilateral common femoral artery cannulation under ultrasound guidance 2. Right iliofemoral endarterectomy with bovine patch angioplasty 3. "Preclose" repair left common femoral artery  4. Placement of catheter in aorta x 2 5. Aortogram 6. Repair of aorta with modular bifurcated prosthesis with one limb (31 mm x 14 mm x 17 cm) 7. Placement of Left iliac limb (14 mm x 12 cm) 8. Radiology S&I 9. Aortic valvuloplasty (done by Dr. [name])  PRE-OPERATIVE DIAGNOSIS: large abdominal aortic aneurysm , severe aortic stenosis  POST-OPERATIVE DIAGNOSIS: same as above   CO-SURGEONS: [name], MD; Cari Caraway, MD  ANESTHESIA: general   ESTIMATED BLOOD LOSS: 575 cc   CONTRAST: 175 cc  FINDING(S):  1. No endoleak visualized  2. Successful exclusion of abdominal aortic aneurysm  3. Viable bilateral feet with right palpable anterior tibial artery and dopplerable posterior tibial artery.  Left foot without dopplerable signal but viable foot.  INDICATIONS:  Shawn Mathews is a 80 y.o. male  who presents with large abdominal aortic aneurysm, 8.5  cm.  As part of his pre-operative work-up, a severe aortic stenosis was found.  Cardiology and CT surgery felt that he would be best temporized with a valvuloplasty before considering TAVR.  This was aware of the significant mortality risk he had due to the large AAA (30-50% rupture rate/year) and severe AS not surgically amendable.  I offered him a joint valvuloplasty and EVAR.  The patient is aware the risks of endovascular aortic surgery include but are not limited to: bleeding, need for transfusion, infection, death, stroke, paralysis, wound complications, impotence, bowel ischemia, extended ventilation and need for secondary procedures. Overall, I cited a EVAR mortality rate of 1-2% and morbidity rate of 15%.  He is aware his risk is significantly higher due to his concomittant AS disease.  DESCRIPTION:  After full  informed written consent was obtained from the patient, he was brought back to the operating room, amd placed supine upon the operating table. He already had a A-line place and Swan-Ganz catheter.  After obtaining adequate anesthesia, a Foley catheter was placed to monitor urine output in the operating room. He was prepped and draped in the standard fashion for open aortic valve repair and open aneurysm repair or endovascular aneurysm repair.   I first turned my attention to the left groin. Under ultrasound guidance, I identified the common femoral artery and made a stab incision over it. I dissected down to the artery in the groin with a hemostat and then cannulated it with a 18-gauge needle. I then passed a Benson wire up into the iliac system. I dilated the skin tract with a 8-Fr dilator. The first ProGlide was placed and rotated medially. The sutures were removed and the [facility] wire reloaded in the Proglide.  It was exchanged for a new one.  The second ProGlide was deployed and rotated laterally.  Again, the sutures were removed and tagged and the wire replaced.  In such fashion, I completed the pre-close technique on left side.  The [facility] wire would not easily advance past the common iliac artery so I placed KMP catheter and used these two to get into the suprarenal aorta.  Initially, a short 8-French sheath was loaded over the wire. Using the KMP catheter this Teena Dunk wire was exchanged for an Amplatz wire. A 12-Fr sheath was placed over the wire and hubbed into the right aorta.    At this point, I turned my attention to the  right groin. In a similar fashion, I cannulated the right common femoral artery under sonosite guidance with a 18 gauge needle.  I advanced the wire into the aorta.  A Proglide was loaded over the wire and deployed in a medial position.  Due to likely calcification, one of the suture sheared into two with deployment of the device.  The wire was inadvertently removed and the Proglide  had to be removed and pressure held.  There continued to be bleeding in the right groin after 15 minutes of pressure hold, so I elected to dissect out the right common femoral artery.  While Dr. Laneta Simmers (who was helping Dr. [name] with the valvuloplasty) held pressure, I dissected out the right common femoral artery with electrocautery.  I was able to eventually identify the segment of common femoral artery cannulated and repair the arteriotomy with 6-0 sutures.  I dissected out the common femoral artery completely from proximal segment to bifurcation.  I then tried to cannulate the right common femoral artery with a 18 gauge and micropuncture system.  While the artery could be cannulated, I could not pass a wire up the artery.  There remained a pulse in the artery.  At this point, a pigtail was loaded over the left Amplatz wire and an aortogram completed.  This demonstrated patent flow in the right iliac and femoral arteries.  The left iliac system appeared obturated by the left femoral sheath.  Additionally, it appeared that while the left femoral sheath was in the suprarenal aorta, it appeared to be traversing mural thrombus.    At this point, I had Dr. Edilia Bo scrub in also.  We looked at the right common femoral artery and he felt that it was heavily disease.  I dissected out the distal external iliac artery and all the profunda branches and superficial femoral artery.  I gave the patient a therapeutic bolus of heparin (80 mg/kg) and then clamped or placed under tension all branches.  I clamped the distal external iliac artery with some difficulty due to calcification.  I made an arteriotomy with an 11-blade and extended it proximally and distally.  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  There was essentially a chronic anterior plaque with a necrotic core.  I extended the arteriotomy proximally and could find a lumen present.  In an open  fashion, I placed a right femoral sheath and passed a Benson wire into the aorta.  Now that bilateral femoral access was obtained, I turned the case over to Dr. [name].  He completed the valvuloplasty as documented in his operative note.  Unfortunately, after completing the valvuloplasty, the balloon would not completely collapse into left femoral sheath, so the balloon and sheath had to be pulled out as a unit.  A 14-Fr Dryseal sheath was placed over the wire into the left external iliac artery.  Using hand injections, I identified the main flow channel.  I placed a pigtail catheter over the right Benson wire and used it with the wire to get into the suprarenal aorta.  An aortogram was completed to identify the location of the renal arteries and measure the length of main body needed.   I selected a Gore C3 31 mm x 14 mm x 17 cm main body, decided to deploy from the right side rather than the left as originally plan.  The plan had to be changed due to the severe common iliac artery calcifications that resulted  in the path dissecting into the mural thrombus.    Using a glidewire and KMP, Dr. Edilia Bo was able to follow the pigtail catheter into the main aortic channel.  We exchanged the wire out for an Amplatz wire.  The 14-Fr Dryseal sheath was advanced into the aorta without difficulty.  At this point, I placed an Amplatz wire into the descending aorta via the right pigtail catheter.  The catheter was removed and the right sheath exchanged for a 18-Fr sheath.  The main body was placed over the right wire into the infrarenal position.  The pigtail catheter was placed on the left wire and placed suprarenal.  Another injection was completed to verify renal position.  I deployed the C3 main body exactly below the right renal artery, which was lowest, keeping the ipsilateral limb constrained.  A completion injection verified infrarenal deployed.  At this point, Dr. Edilia Bo pulled the pigtail catheter back into the  aortic sac and exchanged the catheter over a Benson wire for KMP catheter.  The wire was exchanged for a Glidewire.  Both Dr. Edilia Bo and I could not cannulated the gate with this combination, so I exchanged the catheter for a Vancheis catheter.  Using this catheter and wire, Dr. Edilia Bo selected the contralateral gate.  The wire and catheter were advanced into the main body.  The wire was exchanged for an Amplatz and the catheter exchanged for a pigtail catheter.  The catheter was spun in the main body to verify cannulation.  The Amplatz was readvanced into the descending aorta.  The pigtail catheter was placed at the flow divider.  Due to severe iliac tortuousity a LAO 55 CRA 30 injection of the left iliac system was obtained to identify the length of the left iliac system.  Based on this injection, a 14 mm x 12 cm iliac limb was selected.  The sheath dilator was replaced on the left and the catheter was hubbed into the main body.  The dilator was exchanged for the 14 mm x 12 cm iliac limb which was placed with adequate overlap.  Dr. Edilia Bo deployed this limb, taking care to avoid covering the left internal iliac artery.  The stent delivery system was removed.  At this point, I did a right iliac retrograde injection to very that the ipsilateral limb would not cover the iliac artery.  I fully deployed the ipsilateral limb and released the main body from the stent delivery system.  This was removed.    I then obtained a Q50 molding balloon which was I used to mold the top of the graft, left limb junction and distal aspect.  The balloon was removed. The balloon was placed over the right wire and used to inflate the distal ipsilateral limb.  The balloon was removed.  The pigtail catheter was placed over the right wire and the wire removed. The catheter was connected to the power injector circuit.  Completion aortogram demonstrated no endoleak. There concerned of a distal Type II leak, so we placed the patient in  steep LAO again and repeated the injection.  The previous concern of a Type II endoleak turned out to be unrelated flow from the bladder.    At this point, left common femoral artery was repaired by cinching down the ProGlide sutures in two stages. In the first stage, the wire was left in place after removing the sheath. There was minimal blood loss after the first stage, with the wire in place. The wire was then removed  from the right groin and the sutures re-cinched and the white tightening stitches were pulled to fully deploy the Proglide devices. All four sutures were held in place with a hemostat with tension on the skin.   At this point, I turned my attention back to the right femoral artery.  I clamped the distal external iliac artery while pulling out the sheath.  There was considerable circumferential plaque here.  I started the endarterectomy in mid-segment and carried it into the distal external iliac artery.  It became evident that there was a chronic ruptured plaque in the distal external iliac artery, so I had to extend the endarterectomy into the distal external iliac artery.  I reclamped the external iliac artery more proximally and extended the arteriotomy proximally.  Eventually, I was able to extract the chronic rupture plaque down to the distal femoral bifurcation.  I transected the plaque proximal the femoral bifurcation, so the disease appeared to be more adherent here.  I felt that a clean endarterectomy extending into the profunda and superficial femoral artery was not going to be possible due to the soft plaque present.  I placed severe tacking sutures with 7-0 Prolene to keep the distal plaque adherent to the wall.  After reinspecting this iliofemoral segment, I felt no further endarterectomy was going to be safe without compromising wall integrity.  I obtained bovine pericardial patch and sewed it on with a running 6-0 Prolene.  Prior to completing this patch angioplasty, I backbled  the superficial femoral artery: limited back bleeding was present so I passed a 3 Fogarty distally: no thrombus was present but substantial resistance was noted distally.  I backbled both profunda branches and one had reasonable backbleeding.  I allowed the distal external iliac artery to bleed and good pulsatile bleeding was present.  I complete the patch angioplasty in the usual fashion.  I released all clamps and vessel loops.  A few bleeding points required suture repair with 6-0 Prolene.  I placed thrombin and gelfoam.  The left groin continued to ooze, so I elected to reverse anticoagulation with 30 mg of Protamine.  After waiting a few minutes, there was no further significant bleeding from either groin.  The left Proglide sutures were transected with suture transection device.  The skin stab incision was repaired with a U stitch of 4-0 Vicryl.   I repaired the right groin with a double layer of 2-0 Vicryl and a double layer of 3-0 Vicryl.  The skin in both groins was cleaned, dried, and reinforced with Dermabond.    At the end of the case, I could feel a R anterior tibial artery pulse and doppler a right posterior tibial artery.  The left foot had no dopplerable signals but clearly was viable as it had cap refill < 2 sec.  I suspect once the patient warms up further, a signal will be obtainable.  He had no palpable pulses previous on the L due to chronic superficial femoral artery occlusion.  In this patient with severe AS, I doubt he can survive proceeding immediately with a L femoral popliteal bypass or femoral tibial bypass.  Additionally, he was anemic, hypotensive, and oliguric at this point, so I felt resuscitation was the priority.   SPECIMEN(S): none   COMPLICATIONS: none   CONDITION: stable   [name], MD [facility] [place] Office: [phone] Pager: [phone]  11/25/2013, 2:33 PM

## 2013-11-06 NOTE — CV Procedure (Signed)
Intra-operative Transesophageal Echocardiography Report:  Shawn Mathews is an 80 year old man who presented to the emergency department on 8/31 with abdominal pain and was found to have an 8 cm abdominal aortic aneurysm. He was also noted to have a heart murmur and was discovered to have critical aortic stenosis. He subsequently underwent cardiac catheterization which showed diffuse nonobstructive coronary disease. He is now scheduled to undergo endovascular aortic stent repair and balloon aortic  valvuloplasty. Intraoperative transesophageal echocardiography was indicated to evaluate the aortic valve and to assist with the balloon  valvuloplasty and to assess for any complications.  The patient was brought to the operating room at [facility] and general anesthesia was induced without difficulty. Following endotracheal intubation and oral gastric suctioning, the transesophageal echocardiography probe was inserted into the esophagus without difficulty.   Impression: Pre-valvuloplasty Study:  1. Aortic valve: The aortic valve was trileaflet. The leaflets were thickened and heavily calcified with severe restriction to opening. There was 1+ aortic insufficiency. Continuous-wave Doppler interrogation of the aortic outflow revealed a peak velocity of 5.38 m/s. Peak instantaneous gradient of 116 mmHg, mean gradient 76 mmHg. The ratio of the LVOT VTI to the aortic valve VTI was 0.12. The aortic annulus measured 2.45 cm. The aortic valve area was 0.54 cm2 using the VTI method and 0.50 cm2 using the V-max method.  2. Mitral valve: There was severe calcification the mitral annulus which was most pronounced at the base of the posterior leaflet. There was moderate to severe thickening of the mitral leaflets. There was trace mitral insufficiency.  3. Left ventricle: There was severe concentric left ventricular hypertrophy. Left ventricular end diastolic wall thickness measured 1.80 at the posterior wall and  1.86 at the anterior wall in the transgastric short axis view. The left ventricular cavity was not enlarged and measured 3.40 cm at the mid-papillary level in the transgastric short axis view. There was good contractility in all segments interrogating and the ejection fraction was estimated at 60-65%.   4. Right ventricle: The right ventricular cavity was within normal limits of size. The contractility of the right ventricular free wall was somewhat difficult to appreciate but appeared normal.  5. Tricuspid valve: The tricuspid valve appeared structurally normal and there was trace tricuspid insufficiency.  6. Interatrial Septum: There was no evidence of patent foramen ovale or atrial septal defect by color Doppler and bubble study.  7. Left Atrium: There was no thrombus noted within the left atrium or left atrial appendage.  8. Ascending Aorta: There was effacement of the  sinuses of Valsalva. There was no aneurysmal enlargement of the ascending aorta. There was diffuse calcification noted within the walls of the ascending aorta but no protruding atheroma noted.  Post valvuloplasty findings.  1. Aortic valve: There was again restriction to opening of the leaflets. The leaflets again appeared thickened with calcification noted. There was 2+ aortic insufficiency. Continuous-wave Doppler interrogation of the left ventricular outflow revealed a peak velocity of 3.82 m/s. The peak instantaneous gradient was 58 mmHg and the mean gradient was 40 mm Hg. The aortic valve area  was 0.94 using the V-max method and 0.94 cm square using the VTI method. The ratio of  the LVOT VTI to the aortic valve VTI  was 0.22.  2. Mitral valve: The mitral valve appeared structurally unchanged from the pre-bypass study. There was severe calcification of the mitral annulus. There was 1+ mitral insufficiency.  3. Left ventricle: There was good contractility in all segments interrogated. There was  severe concentric left  ventricular hypertrophy and the ejection fraction was estimated at 60-65%.  Kipp Brood, MD

## 2013-11-06 NOTE — Progress Notes (Signed)
   Daily Progress Note  I had extensive discussion with this patient of the risks of the AAA (30-50% rupture risk/year) and risks of EVAR (1-2% mortality, 15% morbidity).  Dr. [name] has discussed with the patient the risks of Valvuloplasty.  The patient is aware he is unlikely to survive an open AVR or an open AAA repair.  The patient would like to proceed with attempt at EVAR and valvuloplasty as a bridge to TAVR regardless of the risks.  [name], MD [facility] [place] Office: [phone] Pager: [phone]  11/12/2013, 7:25 AM

## 2013-11-06 NOTE — H&P (View-Only) (Signed)
    Subjective:  No chest pain or shortness of breath at rest. No abdominal pain.  Objective:  Vital Signs in the last 24 hours: Temp:  [97.5 F (36.4 C)-98.1 F (36.7 C)] 97.7 F (36.5 C) (09/04 0700) Pulse Rate:  [73-79] 74 (09/04 0700) Resp:  [17-18] 18 (09/04 0700) BP: (120-126)/(71-86) 126/71 mmHg (09/04 0700) SpO2:  [91 %-96 %] 91 % (09/04 0700)  Intake/Output from previous day: 09/03 0701 - 09/04 0700 In: 840 [P.O.:240; I.V.:600] Out: 3600 [Urine:3600]  Physical Exam: Pt is alert and oriented, NAD HEENT: normal Neck: JVP - normal, carotids delayed Lungs: CTA bilaterally CV: RRR with high pitched late peaking grade 3/6 systolic murmur heard best at the apex and lateral chest, A2 is absent Abd: soft, NT Ext: no C/C/E, femoral pulses are 2+ Skin: warm/dry no rash   Lab Results:  Recent Labs  11/04/2013 0308 11/01/13 0257  WBC 5.5 6.6  HGB 12.4* 13.6  PLT 71* 79*    Recent Labs  11/17/2013 0308 11/01/13 0257  NA 143 142  K 3.6* 3.9  CL 109 104  CO2 23 24  GLUCOSE 93 94  BUN 13 15  CREATININE 0.95 0.86   No results found for this basename: TROPONINI, CK, MB,  in the last 72 hours  Assessment/Plan:  1. Abdominal aortic aneurysm without rupture, 8.5 cm 2. Critical aortic stenosis, symptomatic 3. Nonobstructive CAD 4. Thrombocytopenia  Discussed the patient's case at length yesterday with Dr Chen and Dr Bartle. Complex decision making in this patient with a large abdominal aortic aneurysm and critical aortic stenosis. The patient otherwise is functionally independent. After careful discussion, we feel that balloon valvuloplasty combined with endovascular repair of his aneurysm is the best initial approach. As long as he does well, he would be considered for TAVR after he recovers from surgery. We have discussed they'll out strategies in case he has severe aortic insufficiency or other major complication related to balloon aortic valvuloplasty. Considerations  include emergent TAVR versus open cardiac surgery. Will review further with Dr. Bartle before the procedure. I have reviewed the risks, indications, and alternatives to balloon aortic valvuloplasty at length with the patient. Risks include but are not limited to vascular injury, stroke, cardiac perforation, tamponade, myocardial infarction, aortic dissection, aneurysm rupture, and death. The patient understands these risks and agrees to proceed. Plan for first case Tuesday morning with Dr Chen.  Darla Mcdonald, M.D. 11/02/2013, 7:41 AM     

## 2013-11-06 NOTE — Interval H&P Note (Signed)
History and Physical Interval Note:  11/26/2013 7:09 AM  [name]  has presented today for surgery, with the diagnosis of Abdominal aneurysm without mention of rupture   The various methods of treatment have been discussed with the patient and family. After consideration of risks, benefits and other options for treatment, the patient has consented to  Procedure(s) with comments: ABDOMINAL AORTIC ENDOVASCULAR STENT GRAFT VERSUS OPEN REPAIR (N/A) BALLOON AORTIC VALVE VALVULOPLASTY (N/A) - Cooper will start first TRANSCATHETER AORTIC VALVE REPLACEMENT, TRANSAORTIC (N/A) as a surgical intervention .  The patient's history has been reviewed, patient examined, no change in status, stable for surgery.  I have reviewed the patient's chart and labs.  Questions were answered to the patient's satisfaction.     Mannix Kroeker LIANG-YU

## 2013-11-06 NOTE — Progress Notes (Signed)
eLink Physician-Brief Progress Note Patient Name: Shawn Mathews DOB: [birth year] MRN: [id number]   Date of Service  11/16/2013  HPI/Events of Note   Oozing from IV sites / surgical site Vascular surgery with blood loss Coagulopathy  Recent Labs Lab 11/11/2013 0308 11/10/2013 1500  INR 1.42 1.97*    eICU Interventions   FFP      Intervention Category Major Interventions: Hemorrhage - evaluation and management  Nikolas Casher 10/30/2013, 6:37 PM

## 2013-11-06 NOTE — Progress Notes (Addendum)
CCM notified pt vomited, NO OG present at time of event. OG placed after event. 100cc immediately out from OG after placement brown liquid. ETT was suctioned immediately after event and no secretion present. Will cont to monitor and assess patient

## 2013-11-06 NOTE — H&P (View-Only) (Signed)
   Referred by: [facility] ED  Reason for referral: AAA  History of Present Illness  The patient is a 80 y.o. (07/07/1927) male who presents with chief complaint: abdominal pain.  Pain started 2 weeks ago without obvious trigger.  Pain has vague character, 4/10, without any radiation or associated sx.  The patient was seen by his PCP and sent for abdominal ultrasound which demonstrates a 8 cm AAA today.  He was triaged to the ED for evaluation for sx.  AAA.  The patient does not history of embolic episodes from the AAA.  The patient's risk factors for AAA included: age, male sex and prior smoking.  The patient smoked cigarettes previous, quitting in mid-1990s.  Past Medical History  Diagnosis Date  . Hypertension   . Gout   . Glaucoma    Osteoarthritis  Past Surgical History  Procedure Laterality Date  . Total hip arthroplasty    . Lumbar disc surgery      History   Social History  . Marital Status: Widowed    Spouse Name: N/A    Number of Children: N/A  . Years of Education: N/A   Occupational History  . Not on file.   Social History Main Topics  . Smoking status: Former Smoker -- 30 years    Quit date: 03/29/1986  . Smokeless tobacco: Not on file  . Alcohol Use: No  . Drug Use: No  . Sexual Activity:    Other Topics Concern  . Not on file   Social History Narrative  . No narrative on file    Family History  Problem Relation Age of Onset  . Diabetes Brother   . Coronary artery disease Brother   . Stroke Brother   . Diabetes Mother     No current facility-administered medications on file prior to encounter.   Current Outpatient Prescriptions on File Prior to Encounter  Medication Sig Dispense Refill  . allopurinol (ZYLOPRIM) 100 MG tablet Take by mouth daily.      . amLODipine (NORVASC) 5 MG tablet Take 5 mg by mouth daily.        Allergies  Allergen Reactions  . Penicillins     unknown    REVIEW OF SYSTEMS:  (Positives checked otherwise  negative)  CARDIOVASCULAR:  [] chest pain, [] chest pressure, [] palpitations, [] shortness of breath when laying flat, [] shortness of breath with exertion,  [] pain in feet when walking, [] pain in feet when laying flat, [] history of blood clot in veins (DVT), [] history of phlebitis, [] swelling in legs, [] varicose veins  PULMONARY:  [] productive cough, [] asthma, [] wheezing  NEUROLOGIC:  [] weakness in arms or legs, [] numbness in arms or legs, [] difficulty speaking or slurred speech, [] temporary loss of vision in one eye, [] dizziness  HEMATOLOGIC:  [] bleeding problems, [] problems with blood clotting too easily  MUSCULOSKEL:  [] joint pain, [] joint swelling  GASTROINTEST:  [] vomiting blood, [] blood in stool, [x] no abd pain     GENITOURINARY:  [] burning with urination, [] blood in urine  PSYCHIATRIC:  [] history of major depression  INTEGUMENTARY:  [] rashes, [] ulcers  CONSTITUTIONAL:  [] fever, [] chills  For VQI Use Only  PRE-ADM LIVING: Home  AMB STATUS: Ambulatory  CAD Sx: None  PRIOR CHF: None  STRESS TEST: [x] No, [ ] Normal, [ ] + ischemia, [ ] + MI, [ ] Both     Physical Examination  Filed Vitals:   10/05/2013 1546 10/26/2013 1619 10/25/2013 1630 10/10/2013 1720  BP: 140/86 137/77  145/93  Pulse: 69 64 71 67  Temp: 98.2 F (36.8 C)     TempSrc: Oral     Resp: 18 18 20 18  Weight: 151 lb 4 oz (68.607 kg)     SpO2: 92% 95% 96% 93%   There is no height on file to calculate BMI.  General: A&O x 3, WDWN  Head: [place]/AT  Ear/Nose/Throat: Hearing grossly intact, nares w/o erythema or drainage, oropharynx w/o Erythema/Exudate  Eyes: PERRLA, EOMI  Neck: Supple, no nuchal rigidity, no palpable LAD  Pulmonary: Sym exp, good air movt, CTAB, no rales, rhonchi, & wheezing  Cardiac: RRR, Nl S1, S2, no rubs or gallops, +Murmur  Vascular: Vessel Right Left  Radial Faintly Palpable Faintly Palpable  Brachial Faintly Palpable Faintly Palpable  Carotid  Palpable, without bruit Palpable, with transmitted murmur  Aorta Palpable AAA N/A  Femoral Faintly Palpable Faintly Palpable  Popliteal Not palpable Not palpable  PT Faintly Palpable Not Palpable  DP Faintly Palpable Not Palpable   Gastrointestinal: soft, NTND, -G/R, - HSM, - masses, mild L CVAT, non-tender AAA  Musculoskeletal: M/S 5/5 throughout , Extremities without ischemic changes   Neurologic: CN 2-12 intact , Pain and light touch intact in extremities , Motor exam as listed above  Psychiatric: Judgment intact, Mood & affect appropriate for pt's clinical situation  Dermatologic: See M/S exam for extremity exam, no rashes otherwise noted  Lymph : No Cervical, Axillary, or Inguinal lymphadenopathy   Non-Invasive Vascular Imaging  Radiology: Us Abdomen Complete  10/02/2013   ADDENDUM REPORT: 10/14/2013 12:47  ADDENDUM: Findings and recommendations were discussed with Dr. Spruill at 12:40 p.m. As patient has no abdominal pain, the findings are likely stable/chronic. Further evaluation with CT was recommended on an urgent, but not emergent basis.   Electronically Signed   By: Daniel  Boyle M.D.   On: 10/19/2013 12:47   10/13/2013   CLINICAL DATA:  Acute abdominal pain.  EXAM: ULTRASOUND ABDOMEN COMPLETE  COMPARISON:  None.  FINDINGS: Exam somewhat limited due to patient body habitus and abundant overlying bowel gas.  Gallbladder:  No gallstones or wall thickening visualized. No sonographic Murphy sign noted.  Common bile duct:  Diameter: 4 mm.  Liver:  No focal lesion identified. Within normal limits in parenchymal echogenicity.  IVC:  Not visualized.  Pancreas:  Visualized portion unremarkable.  Spleen:  Size and appearance within normal limits.  Right Kidney:  Length: 8.6 cm. Echogenicity within normal limits. No mass or hydronephrosis visualized.  Left Kidney:  Length: 8.7 cm. Echogenicity within normal limits. No mass or hydronephrosis visualized.  Abdominal aorta:  Proximal segment not  visualized. Moderate atherosclerotic disease with significant aneurysmal dilatation of the mid to distal abdominal aorta measuring approximately 7.8 x 8.1 cm in its AP and transverse dimensions and extending approximately 10.8 cm in length. There is moderate mural thrombus as the lumen measures approximately 2.2 x 3.2 cm in AP and transverse dimension.  Other findings:  None.  IMPRESSION: Significant aneurysm of the mid to distal abdominal aorta measuring approximately 7.8 x 8.1 cm in its AP and transverse dimensions and extending 10.8 cm in length. Moderate mural thrombus as the lumen measures 2.2 x 3.2 cm. Recommend CT abdomen with and without contrast to exclude contained rupture in this patient with abdominal pain.  These results were called by telephone at the time of interpretation on 10/25/2013 at   11:11 am to Dr. JEROME SPRUILL's recepeptionist, Nicole Mebane, , who verbally acknowledged these results. We are unable to reach Dr. Spruill at this time. I therefore called patient directly as patient states he has no known history of abdominal aneurysm and states he has no abdominal pain. We discussed that he needs further evaluation with CT scan and that Dr. Spruill's office will be contacting him to scheduled the exam. We discussed that he should proceed to the nearest ER if he experiences abdominal pain.  Electronically Signed: By: Daniel  Boyle M.D. On: 10/24/2013 11:26    Medical Decision Making  The patient is a 80 y.o. male who presents with: chronic abdominal pain , large AAA.   I don't think the abdominal pain is due to a sx AAA at this point.  Regardless, he is going to need to be repaired as his annual rupture rate for a 8 cm aneurysm is 30-50%.  Based on this patient's exam and diagnostic studies, he needs CTA Abd/pelvis.  The repair technique OAR vs EVAR will depend on the anatomy present on CTA.  Thank you for allowing us to participate in this patient's care.  Beacher Every,  MD [facility] Office: [phone] Pager: [phone]  10/01/2013, 5:41 PM   Addendum  CTA Abd/pelvis (10/06/2013)  1. 8.5 cm infrarenal abdominal aortic aneurysm extending across the bifurcation to involve the proximal left common iliac artery. No evidence of rupture or impending rupture. However, the size presents will a significant risk of rupture, and vascular surgical consultation is recommended.  2. Tortuous right iliac arterial system without aneurysm or stenosis.  3. Origin occlusion of the left SFA.  4. 17 mm exophytic left renal mass, possibly hyperdense cyst but incompletely characterized.  5. Colonic diverticulosis.  Based on my review of this patient's CTA, he has a large infrarenal AAA without any evidence of rupture.  While he does have diverticulosis, I don't see anything to account for his abdominal pain.  He has >3 cm of aortic neck with tortuous iliac arteries (L>R).  The L side appears more amendable for main body delivery.  The distal aortic taper might cause some difficulties with cannulation of contralateral limb if the contralateral gate opens more distally, so a shorter body would be better in this case.  I suspect the right iliac artery can be navigated after placing a stiff wire.    - I suspect this patient's anatomy is compatible with EVAR.  My [employer] rep will review the films in the AM. - Given the size of the AAA, this patient will need repair unless expectant management is elected.  This patient has elected repair even if OAR is necessary. - Admit to 3S for BP control, close observation, IV fluid resuscitation to decrease risk of CIN - Hybrid OR is booked for the entirety of tomorrow so will aim for Wednesday  - Cardiology evaluation for optimization and evaluation of reported new murmur  Elsa Ploch, MD [facility] [place] [facility] Office: [phone] Pager: [phone]  10/01/2013, 7:17 PM    

## 2013-11-06 NOTE — Transfer of Care (Signed)
Immediate Anesthesia Transfer of Care Note  Patient: Shawn Mathews  Procedure(s) Performed: Procedure(s) with comments: ABDOMINAL AORTIC ENDOVASCULAR STENT GRAFT REPAIR  (Bilateral) BALLOON AORTIC VALVE VALVULOPLASTY (N/A) - Cooper will start first ENDARTERECTOMY FEMORAL WITH PATCH ANGIOPLASTY (Right)  Patient Location: SICU  Anesthesia Type:General  Level of Consciousness: sedated and Patient remains intubated per anesthesia plan  Airway & Oxygen Therapy: Patient remains intubated per anesthesia plan and Patient placed on Ventilator (see vital sign flow sheet for setting)  Post-op Assessment: Report given to PACU RN and Post -op Vital signs reviewed and stable  Post vital signs: Reviewed and stable  Complications: No apparent anesthesia complications

## 2013-11-07 ENCOUNTER — Inpatient Hospital Stay (HOSPITAL_COMMUNITY): Payer: Medicare Other

## 2013-11-07 DIAGNOSIS — R570 Cardiogenic shock: Secondary | ICD-10-CM

## 2013-11-07 DIAGNOSIS — I319 Disease of pericardium, unspecified: Secondary | ICD-10-CM

## 2013-11-07 LAB — CBC
HEMATOCRIT: 30.3 % — AB (ref 39.0–52.0)
HEMATOCRIT: 30.4 % — AB (ref 39.0–52.0)
HEMOGLOBIN: 10.4 g/dL — AB (ref 13.0–17.0)
Hemoglobin: 10.4 g/dL — ABNORMAL LOW (ref 13.0–17.0)
MCH: 31.4 pg (ref 26.0–34.0)
MCH: 32.1 pg (ref 26.0–34.0)
MCHC: 34.3 g/dL (ref 30.0–36.0)
MCHC: 34.2 g/dL (ref 30.0–36.0)
MCV: 91.5 fL (ref 78.0–100.0)
MCV: 93.8 fL (ref 78.0–100.0)
Platelets: 73 10*3/uL — ABNORMAL LOW (ref 150–400)
Platelets: 71 10*3/uL — ABNORMAL LOW (ref 150–400)
RBC: 3.31 MIL/uL — AB (ref 4.22–5.81)
RBC: 3.24 MIL/uL — ABNORMAL LOW (ref 4.22–5.81)
RDW: 15.8 % — ABNORMAL HIGH (ref 11.5–15.5)
RDW: 16.1 % — ABNORMAL HIGH (ref 11.5–15.5)
WBC: 16.3 10*3/uL — ABNORMAL HIGH (ref 4.0–10.5)
WBC: 19.6 10*3/uL — ABNORMAL HIGH (ref 4.0–10.5)

## 2013-11-07 LAB — BASIC METABOLIC PANEL
ANION GAP: 13 (ref 5–15)
Anion gap: 14 (ref 5–15)
Anion gap: 15 (ref 5–15)
BUN: 24 mg/dL — AB (ref 6–23)
BUN: 26 mg/dL — ABNORMAL HIGH (ref 6–23)
BUN: 27 mg/dL — ABNORMAL HIGH (ref 6–23)
CALCIUM: 7.4 mg/dL — AB (ref 8.4–10.5)
CHLORIDE: 107 meq/L (ref 96–112)
CHLORIDE: 106 meq/L (ref 96–112)
CO2: 21 meq/L (ref 19–32)
CO2: 19 meq/L (ref 19–32)
CO2: 18 meq/L — AB (ref 19–32)
Calcium: 7.9 mg/dL — ABNORMAL LOW (ref 8.4–10.5)
Calcium: 7.5 mg/dL — ABNORMAL LOW (ref 8.4–10.5)
Chloride: 105 mEq/L (ref 96–112)
Creatinine, Ser: 2.12 mg/dL — ABNORMAL HIGH (ref 0.50–1.35)
Creatinine, Ser: 2.31 mg/dL — ABNORMAL HIGH (ref 0.50–1.35)
Creatinine, Ser: 2.49 mg/dL — ABNORMAL HIGH (ref 0.50–1.35)
GFR calc Af Amer: 31 mL/min — ABNORMAL LOW (ref >90)
GFR calc Af Amer: 28 mL/min — ABNORMAL LOW (ref >90)
GFR calc Af Amer: 26 mL/min — ABNORMAL LOW (ref >90)
GFR calc non Af Amer: 27 mL/min — ABNORMAL LOW (ref >90)
GFR calc non Af Amer: 24 mL/min — ABNORMAL LOW (ref >90)
GFR calc non Af Amer: 22 mL/min — ABNORMAL LOW (ref >90)
GLUCOSE: 97 mg/dL (ref 70–99)
GLUCOSE: 103 mg/dL — AB (ref 70–99)
GLUCOSE: 107 mg/dL — AB (ref 70–99)
POTASSIUM: 4.3 meq/L (ref 3.7–5.3)
Potassium: 4.9 mEq/L (ref 3.7–5.3)
Potassium: 4.5 mEq/L (ref 3.7–5.3)
SODIUM: 139 meq/L (ref 137–147)
Sodium: 140 mEq/L (ref 137–147)
Sodium: 139 mEq/L (ref 137–147)

## 2013-11-07 LAB — PREPARE PLATELET PHERESIS: Unit division: 0

## 2013-11-07 LAB — POCT I-STAT 3, ART BLOOD GAS (G3+)
Acid-base deficit: 3 mmol/L — ABNORMAL HIGH (ref 0.0–2.0)
Acid-base deficit: 5 mmol/L — ABNORMAL HIGH (ref 0.0–2.0)
Acid-base deficit: 5 mmol/L — ABNORMAL HIGH (ref 0.0–2.0)
BICARBONATE: 21.5 meq/L (ref 20.0–24.0)
BICARBONATE: 19.9 meq/L — AB (ref 20.0–24.0)
Bicarbonate: 20.5 mEq/L (ref 20.0–24.0)
O2 SAT: 87 %
O2 Saturation: 95 %
O2 Saturation: 88 %
PCO2 ART: 42.1 mmHg (ref 35.0–45.0)
PCO2 ART: 35.3 mmHg (ref 35.0–45.0)
Patient temperature: 38
Patient temperature: 37.6
Patient temperature: 99.4
TCO2: 23 mmol/L (ref 0–100)
TCO2: 22 mmol/L (ref 0–100)
TCO2: 21 mmol/L (ref 0–100)
pCO2 arterial: 38.4 mmHg (ref 35.0–45.0)
pH, Arterial: 7.361 (ref 7.350–7.450)
pH, Arterial: 7.3 — ABNORMAL LOW (ref 7.350–7.450)
pH, Arterial: 7.359 (ref 7.350–7.450)
pO2, Arterial: 81 mmHg (ref 80.0–100.0)
pO2, Arterial: 63 mmHg — ABNORMAL LOW (ref 80.0–100.0)
pO2, Arterial: 56 mmHg — ABNORMAL LOW (ref 80.0–100.0)

## 2013-11-07 LAB — APTT
APTT: 40 s — AB (ref 24–37)
aPTT: 42 seconds — ABNORMAL HIGH (ref 24–37)

## 2013-11-07 LAB — FIBRINOGEN: Fibrinogen: 284 mg/dL (ref 204–475)

## 2013-11-07 LAB — LACTIC ACID, PLASMA
LACTIC ACID, VENOUS: 3 mmol/L — AB (ref 0.5–2.2)
Lactic Acid, Venous: 3 mmol/L — ABNORMAL HIGH (ref 0.5–2.2)
Lactic Acid, Venous: 2.9 mmol/L — ABNORMAL HIGH (ref 0.5–2.2)

## 2013-11-07 LAB — LIPASE, BLOOD: LIPASE: 69 U/L — AB (ref 11–59)

## 2013-11-07 LAB — PREPARE FRESH FROZEN PLASMA
UNIT DIVISION: 0
Unit division: 0

## 2013-11-07 LAB — MAGNESIUM: Magnesium: 1.7 mg/dL (ref 1.5–2.5)

## 2013-11-07 LAB — AMYLASE: Amylase: 430 U/L — ABNORMAL HIGH (ref 0–105)

## 2013-11-07 LAB — GLUCOSE, CAPILLARY
Glucose-Capillary: 96 mg/dL (ref 70–99)
Glucose-Capillary: 96 mg/dL (ref 70–99)
Glucose-Capillary: 90 mg/dL (ref 70–99)
Glucose-Capillary: 113 mg/dL — ABNORMAL HIGH (ref 70–99)

## 2013-11-07 LAB — PROTIME-INR
INR: 1.64 — AB (ref 0.00–1.49)
INR: 1.65 — AB (ref 0.00–1.49)
Prothrombin Time: 19.4 seconds — ABNORMAL HIGH (ref 11.6–15.2)
Prothrombin Time: 19.5 seconds — ABNORMAL HIGH (ref 11.6–15.2)

## 2013-11-07 LAB — TROPONIN I: TROPONIN I: 5.91 ng/mL — AB (ref <0.30)

## 2013-11-07 LAB — LACTATE DEHYDROGENASE: LDH: 942 U/L — ABNORMAL HIGH (ref 94–250)

## 2013-11-07 LAB — TSH: TSH: 4.39 u[IU]/mL (ref 0.350–4.500)

## 2013-11-07 MED ORDER — VANCOMYCIN HCL 500 MG IV SOLR
500.0000 mg | INTRAVENOUS | Status: DC
  Administered 2013-11-07: 500 mg via INTRAVENOUS
  Filled 2013-11-07: qty 500

## 2013-11-07 MED ORDER — CETYLPYRIDINIUM CHLORIDE 0.05 % MT LIQD
7.0000 mL | Freq: Four times a day (QID) | OROMUCOSAL | Status: DC
  Administered 2013-11-07 – 2013-11-21 (×56): 7 mL via OROMUCOSAL

## 2013-11-07 MED ORDER — PANTOPRAZOLE SODIUM 40 MG PO PACK
40.0000 mg | PACK | Freq: Every day | ORAL | Status: DC
  Administered 2013-11-07: 40 mg
  Filled 2013-11-07 (×2): qty 20

## 2013-11-07 MED ORDER — CHLORHEXIDINE GLUCONATE 0.12 % MT SOLN
15.0000 mL | Freq: Two times a day (BID) | OROMUCOSAL | Status: DC
  Administered 2013-11-07 – 2013-11-20 (×28): 15 mL via OROMUCOSAL
  Filled 2013-11-07 (×29): qty 15

## 2013-11-07 MED ORDER — SODIUM CHLORIDE 0.9 % IV BOLUS (SEPSIS)
1000.0000 mL | Freq: Once | INTRAVENOUS | Status: AC
  Administered 2013-11-07: 1000 mL via INTRAVENOUS

## 2013-11-07 MED ORDER — IOHEXOL 300 MG/ML  SOLN
25.0000 mL | INTRAMUSCULAR | Status: AC
  Administered 2013-11-07 (×2): 25 mL via ORAL

## 2013-11-07 MED ORDER — SODIUM CHLORIDE 0.9 % IV SOLN
Freq: Once | INTRAVENOUS | Status: AC
  Administered 2013-11-07: 11:00:00 via INTRAVENOUS

## 2013-11-07 MED ORDER — SODIUM CHLORIDE 0.9 % IV SOLN
Freq: Once | INTRAVENOUS | Status: AC
  Administered 2013-11-09: 06:00:00 via INTRAVENOUS

## 2013-11-07 MED ORDER — PIPERACILLIN-TAZOBACTAM 3.375 G IVPB
3.3750 g | Freq: Three times a day (TID) | INTRAVENOUS | Status: DC
  Administered 2013-11-07 – 2013-11-08 (×4): 3.375 g via INTRAVENOUS
  Filled 2013-11-07 (×5): qty 50

## 2013-11-07 MED ORDER — DOCUSATE SODIUM 50 MG/5ML PO LIQD
100.0000 mg | Freq: Every day | ORAL | Status: DC
  Filled 2013-11-07: qty 10

## 2013-11-07 MED ORDER — SODIUM CHLORIDE 0.9 % IV BOLUS (SEPSIS)
500.0000 mL | INTRAVENOUS | Status: AC
  Administered 2013-11-08: 500 mL via INTRAVENOUS

## 2013-11-07 MED ORDER — IPRATROPIUM-ALBUTEROL 0.5-2.5 (3) MG/3ML IN SOLN: 3.0000 mL | Freq: Four times a day (QID) | RESPIRATORY_TRACT | Status: DC | PRN

## 2013-11-07 MED ORDER — VANCOMYCIN HCL 500 MG IV SOLR
250.0000 mg | Freq: Once | INTRAVENOUS | Status: AC
  Administered 2013-11-07: 250 mg via INTRAVENOUS
  Filled 2013-11-07: qty 250

## 2013-11-07 MED FILL — Potassium Chloride Inj 2 mEq/ML: INTRAVENOUS | Qty: 40 | Status: AC

## 2013-11-07 MED FILL — Heparin Sodium (Porcine) Inj 1000 Unit/ML: INTRAMUSCULAR | Qty: 30 | Status: AC

## 2013-11-07 MED FILL — Magnesium Sulfate Inj 50%: INTRAMUSCULAR | Qty: 10 | Status: AC

## 2013-11-07 NOTE — Progress Notes (Signed)
Notified Dr. Delton Coombes of am troponin of 5.91.  MD was also notified earlier in the night of troponin of 3.94 at 11pm.  No new orders at this time.  Roselie Awkward, RN

## 2013-11-07 NOTE — Progress Notes (Signed)
eLink Physician-Brief Progress Note Patient Name: Shawn Mathews DOB: 10/17/1927 MRN: [id number]   Date of Service  11/07/2013  HPI/Events of Note  Tachycardia, increasing levophed requirements  eICU Interventions  Cbc, lactate now 500cc bolus     Intervention Category Major Interventions: Hypotension - evaluation and management Intermediate Interventions: Diagnostic test evaluation  Lilac Hoff 11/07/2013, 11:52 PM

## 2013-11-07 NOTE — Progress Notes (Signed)
Called Dr. Derrell Lolling (per previous MD request) about increasing levophed now up to , HR high 130s with one run of SVT earlier.  No new orders at this time.  Request to call MD back if levo increased to .  Will continue to monitor.    Roselie Awkward, RN

## 2013-11-07 NOTE — Progress Notes (Signed)
   Daily Progress Note  Assessment/Planning: POD #1 s/p Valvuloplasty, EVAR, R iliofem EA w/ BPA   NEURO: sedation for vent  PULM: hopefully can extubate today; defer to PCCM  CARD: troponin continuing to climb, will defer mgmt to Cardiology who are following  GI: pt baseline had abd pain preop, unclear etio, will check lactate and watch for any signs of mesenteric ischemia, no evidence of mesenteric compromise; check KUB  REN: ARF, likely a combination of intraoperative hypotension and CIN, both renal arteries were left uncovered; oliguria improved with improved BP and pRBC transfusion  FEN: continue NS @ 75 cc/hr  Appreciate PCCM's help with management of this patient's post-op   Subjective  - 1 Day Post-Op  Overnight making progress in vasopressor support then BP became soft this AM, c/o abd pain  Objective Filed Vitals:   11/07/13 0700 11/07/13 0729 11/07/13 0745 11/07/13 0800  BP: 114/65   103/70  Pulse: 120  128 132  Temp: 99.3 F (37.4 C)  99.5 F (37.5 C) 99.7 F (37.6 C)  TempSrc:      Resp: Height:      Weight:      SpO2: 96% 96% 94% 93%    Intake/Output Summary (Last 24 hours) at 11/07/13 0819 Last data filed at 11/07/13 0800  Gross per 24 hour  Intake 6943.7 ml  Output   2145 ml  Net 4798.7 ml   NEURO Fentanyl gtt @ 125 mcg/hr, briefly wakes and nods to questions PULM  BLL rales, on vent Vent Mode:  [-] PRVC FiO2 (%):  [40 %-50 %] 40 % Set Rate:  [12 bmp-14 bmp] 14 bmp Vt Set:  [580 mL-730 mL] 580 mL PEEP:  [5 cmH20] 5 cmH20 Pressure Support:  [5 cmH20-10 cmH20] 5 cmH20 Plateau Pressure:  [16 cmH20-19 cmH20] 16 cmH20 ABG    Component Value Date/Time   PHART 7.361 11/07/2013 0406   PCO2ART 38.4 11/07/2013 0406   PO2ART 81.0 11/07/2013 0406   HCO3 21.5 11/07/2013 0406   TCO2 23 11/07/2013 0406   ACIDBASEDEF 3.0* 11/07/2013 0406   O2SAT 95.0 11/07/2013 0406   CV  NE 12 mcg/min, Tachy, Nl s1,s2, + murmur GI  soft, ND, diffusely TTP, -G/R,  bilious output from OG tube VASC  B feet viable, dopperable R PT, faintly dopplerable L PT, viable L foot with CR <2 sec  Laboratory CBC    Component Value Date/Time   WBC 16.3* 11/07/2013 0400   HGB 10.4* 11/07/2013 0400   HCT 30.3* 11/07/2013 0400   PLT 73* 11/07/2013 0400    BMET    Component Value Date/Time   NA 140 11/07/2013 0400   K 4.3 11/07/2013 0400   CL 105 11/07/2013 0400   CO2 21 11/07/2013 0400   GLUCOSE 97 11/07/2013 0400   BUN 24* 11/07/2013 0400   CREATININE 2.12* 11/07/2013 0400   CALCIUM 7.9* 11/07/2013 0400   GFRNONAA 27* 11/07/2013 0400   GFRAA 31* 11/07/2013 0400   Cardiac Panel (last 3 results)  Recent Labs  11/11/2013 1614 10/30/2013 2315 11/07/13 0400  TROPONINI 2.29* 3.94* 5.91*    [name], MD [facility] Office: [phone] Pager: [phone]  11/07/2013, 8:19 AM

## 2013-11-07 NOTE — Progress Notes (Signed)
Anesthesiology Follow-up:  Sedated on ventilator, following commands, he is tachycardic (appears to be sinus), requiring levophed 11 mcg/min for BP support.  VS: T- 37.7 BP 103/71 HR- 128 O2 Sat 94%  Labs: BUN/Cr 24/2.12 K-4.3 glucose 97 H/H-10.4/30.4 Platelets 71,000 Troponin 5.91 PT/INR 19.4/1.64   Vent settings: PRVC FiO2 40% TV 580 RR 14 PEEP 5 PH 7.36 PCO2 38.4 PO38 47   80 year old male one day S/P aortic balloon valvuloplasty and EVAR for 8 cm AAA. He appears to have had a Non-STEMI and is developing acute renal failure. Stat ECHO ordered by Dr. Mina Marble. Will follow  Shawn Mathews

## 2013-11-07 NOTE — Progress Notes (Signed)
ANTIBIOTIC CONSULT NOTE - INITIAL  Pharmacy Consult for Vancomycin and Zosyn Indication: intra-abdominal infection  Allergies  Allergen Reactions  . Penicillins     unknown    Patient Measurements: Height:  (177.8 cm) Weight: 170 lb 6.7 oz (77.3 kg) IBW/kg (Calculated) : 73  Vital Signs: Temp: 99.7 F (37.6 C) (09/09 1045) Temp src: Core (Comment) (09/09 0000) BP: 90/64 mmHg (09/09 1000) Pulse Rate: 133 (09/09 1045) Intake/Output from previous day: 09/08 0701 - 09/09 0700 In: 6812.5 [I.V.:4167.6; Blood:1644.8; IV Piggyback:1000] Out: 2105 [Urine:980; Emesis/NG output:550; Blood:575] Intake/Output from this shift: Total I/O In: 358.3 [I.V.:358.3] Out: 130 [Urine:130]  Labs:  Recent Labs  11/13/2013 1354 11/05/2013 1500 11/15/2013 2315 11/07/13 0400 11/07/13 0815  WBC  --  14.7* 16.5* 16.3* 19.6*  HGB 8.2* 10.7* 10.7* 10.4* 10.4*  PLT  --  63* 68* 73* 71*  CREATININE 1.30 1.27  --  2.12*  --    Estimated Creatinine Clearance: 26.8 ml/min (by C-G formula based on Cr of 2.12).  Medical History: Past Medical History  Diagnosis Date  . Hypertension   . Gout   . Glaucoma   . COPD (chronic obstructive pulmonary disease)    Assessment: 86yom admitted on 9/8 with abdominal pain felt to be due to AAA, found to have critical symptomatic aortic stenosis. Underwent endovascular AAA repair on 9/8 with valvuloplasty. Today he has developed worsening shock. He will begin empiric vancomycin and zosyn for possible abdominal infection - CT abdomen pending. He is also now in acute renal failure with sCr almost doubling overnight 1.27-->2.12.  Received 1g of vancomycin pre-op on 9/8 at 0518 and  post-op today at 1037. Noted PCN allergy with reaction 'unknown'.  Goal of Therapy:  Vancomycin trough level 15-20 mcg/ml  Plan:  1) Vancomycin  IV x 1 now to complete total  today then follow up sCr for further maintenance dosing 2) Zosyn 3.375g IV q8 (4 hour  infusion) 3) Follow up CT abdomen  Fredrik Rigger 11/07/2013,11:54 AM

## 2013-11-07 NOTE — Progress Notes (Signed)
RT note- increased fio2 due to low pao2 to 60%.

## 2013-11-07 NOTE — Anesthesia Postprocedure Evaluation (Signed)
  Anesthesia Post-op Note  Patient: ROBERTS BON  Procedure(s) Performed: Procedure(s) with comments: ABDOMINAL AORTIC ENDOVASCULAR STENT GRAFT REPAIR  (Bilateral) BALLOON AORTIC VALVE VALVULOPLASTY (N/A) - Cooper will start first ENDARTERECTOMY FEMORAL WITH PATCH ANGIOPLASTY (Right)  Patient Location: SICU  Anesthesia Type:General  Level of Consciousness: sedated and Patient remains intubated per anesthesia plan  Airway and Oxygen Therapy: Patient remains intubated per anesthesia plan and Patient placed on Ventilator (see vital sign flow sheet for setting)  Post-op Pain: none  Post-op Assessment: PATIENT'S CARDIOVASCULAR STATUS UNSTABLE  Post-op Vital Signs: unstable  Last Vitals:  Filed Vitals:   11/07/13 1445  BP:   Pulse: 129  Temp:   Resp: 20    Complications: No apparent anesthesia complications

## 2013-11-07 NOTE — Consult Note (Signed)
PULMONARY / CRITICAL CARE MEDICINE   Name: Shawn Mathews MRN: [id number] DOB: [birth year]    ADMISSION DATE:  11-19-2013 CONSULTATION DATE:  9/8  REFERRING MD :  Imogene Burn  CHIEF COMPLAINT:  Post op vent management  INITIAL PRESENTATION: 80 y/o male admitted on 9/8 with abdominal pain felt to be due to AAA, found to have critical symptomatic aortic stenosis.  Underwent endovascular AAA repair on 9/8 with valvuloplasty so PCCM consulted for post op vent management.  STUDIES:  8/31 CT angio AB > AAA 8.5 cm, tortuos R iliac arterial system, occluded SFA, 17 mm renal mass left 9/4 CT angio heart/chest> emphysema, calcified mitral and aortic valves, calcified trileaflet aortic valve  SIGNIFICANT EVENTS: 9/8 Aortic valvuloplasty, bilat common fem artery cannulation, R iliofemoral endarterectomy, repair left common femoral artery, aortogram, repair of aorta with bifurcated prosthesis, aortic valvuloplasty 9/9- worsening shock  SUBJECTIVE:  Pressors increasing, abdo pain  VITAL SIGNS: Temp:  [97.3 F (36.3 C)-100.9 F (38.3 C)] 99.9 F (37.7 C) (09/09 0915) Pulse Rate:  [62-132] 131 (09/09 0915) Resp:  [12-25] 15 (09/09 0915) BP: (83-140)/(56-84) 103/71 mmHg (09/09 0900) SpO2:  [91 %-100 %] 95 % (09/09 0915) Arterial Line BP: (90-158)/(48-73) 108/56 mmHg (09/09 0915) FiO2 (%):  [40 %-50 %] 40 % (09/09 0800) Weight:  [70.6 kg (155 lb 10.3 oz)-77.3 kg (170 lb 6.7 oz)] 77.3 kg (170 lb 6.7 oz) (09/09 0600) HEMODYNAMICS: PAP: (29-48)/(13-22) 46/21 mmHg CVP:  [7 mmHg-12 mmHg] 11 mmHg VENTILATOR SETTINGS: Vent Mode:  [-] PRVC FiO2 (%):  [40 %-50 %] 40 % Set Rate:  [12 bmp-14 bmp] 14 bmp Vt Set:  [580 mL-730 mL] 580 mL PEEP:  [5 cmH20] 5 cmH20 Pressure Support:  [5 cmH20-10 cmH20] 5 cmH20 Plateau Pressure:  [16 cmH20-19 cmH20] 16 cmH20 INTAKE / OUTPUT:  Intake/Output Summary (Last 24 hours) at 11/07/13 1045 Last data filed at 11/07/13 0800  Gross per 24 hour  Intake 5293.7 ml  Output    1625 ml  Net 3668.7 ml    PHYSICAL EXAMINATION: General:  Sedated on vent rass 0 HEENT: NCAT, PERRL, ETT in place PULM: coarse CV: RRR, distant heart sounds, apex murmur AB: firm, no BS, no r, guarding voluntary Ext: cool, minimal edema, palpable rt, doplerable left dp Neuro: sedated on vent light, moves all ext  LABS:  CBC  Recent Labs Lab 11/15/2013 2315 11/07/13 0400 11/07/13 0815  WBC 16.5* 16.3* 19.6*  HGB 10.7* 10.4* 10.4*  HCT 31.6* 30.3* 30.4*  PLT 68* 73* 71*   Coag's  Recent Labs Lab 11/17/2013 1500 11/07/13 0856  APTT 61* 42*  INR 1.97* 1.64*   BMET  Recent Labs Lab 10/30/2013 0432  11/18/2013 1354 11/24/2013 1500 11/07/13 0400  NA 140  < > 136* 139 140  K 3.9  < > 4.8 4.8 4.3  CL 102  < > 105 105 105  CO2 24  --   --  21 21  BUN 17  < > 14 15 24*  CREATININE 0.86  < > 1.30 1.27 2.12*  GLUCOSE 92  < > 115* 103* 97  < > = values in this interval not displayed. Electrolytes  Recent Labs Lab 11/18/2013 0432 11/11/2013 1500 11/07/13 0400  CALCIUM 9.0 7.4* 7.9*   Sepsis Markers No results found for this basename: LATICACIDVEN, PROCALCITON, O2SATVEN,  in the last 168 hours ABG  Recent Labs Lab 11/04/2013 1214 11/14/2013 1451 11/07/13 0406  PHART 7.349* 7.319* 7.361  PCO2ART 44.2 44.3 38.4  PO2ART 156.0*  88.0 81.0   Liver Enzymes  Recent Labs Lab 11/01/13 0257  AST 20  ALT 9  ALKPHOS 48  BILITOT 0.7  ALBUMIN 3.2*   Cardiac Enzymes  Recent Labs Lab 11/16/2013 1614 11/20/2013 2315 11/07/13 0400  TROPONINI 2.29* 3.94* 5.91*   Glucose  Recent Labs Lab 11/19/2013 1719 11/24/2013 1818 11/07/2013 2001 11/24/2013 2318 11/07/13 0403 11/07/13 0748  GLUCAP 111* 112* 133* 142* 96 96    Imaging Dg Chest Portable 1 View  11/24/2013   CLINICAL DATA:  Postop for stent graft placement.  EXAM: PORTABLE CHEST - 1 VIEW  COMPARISON:  10/14/2013  FINDINGS: Endotracheal tube is 3.5 cm above the carina. There are two right jugular central lines. One central  line is in the SVC region. The other is a Swan-Ganz catheter which appears to be coiled in the main pulmonary outflow tract region. Few patchy densities at the left lung base are most compatible with atelectasis. No evidence for a pneumothorax.  IMPRESSION: Left basilar atelectasis.  Negative for a pneumothorax.  Swan-Ganz catheter appears to be coiled in the main pulmonary artery region.   Electronically Signed   By: Richarda Overlie M.D.   On: 11/11/2013 15:58     ASSESSMENT / PLAN:  PULMONARY OETT 9/8 >> A: COPD Acute resp failure P:   -full vent support, keep same MV but repeat abg now to ensure acidosis not worsening -ABG now -no weaning with rising pressor needs -duoneb scheduled -pcxr in am   CARDIOVASCULAR CVL R IJ introducer/RHC A: Shock worsening r/o valvular dysfxn post op, r/o SIRS abdo ischemia P:  -stat echo - will pull pa cath back 4 cm and repeat pcxr -troponin, ecg per cards -see GI -avoid vaso until bowel ischemia -neo to weak to utilize with rising levophed -cvp 10-12, bolus  RENAL A:  Acute on chronic renal fialure P:   -monitor urine output -bmet q6h -bolus  GASTROINTESTINAL A: r/o ischemia s/p AAA repair at risk IMA, r/o embolic phenomina P:   -OG tube -NPO -lactic, ldh -consider CT abdo/pelvis, will d/w surgery -lft  HEMATOLOGIC A:  Minor bleeding from groin site in OR resolved, now s/p PRBC Thrombocytopenia consumptive likley coagulapthy P:  -Transfusion goal < 7gm/dL unless troponin positive - repeat coags, assess fibrinogen  INFECTIOUS A:  R/o  Bowel ischemia, r/o abdo sepsis P:   BC 9/9>>> Add empiric abdo coverage 9/9 zosyn>>> 9/9 vanc>>> If declines further add empiric caspo  ENDOCRINE A:  Mild hyperglycemia, now on insulin gtt but no history of DM2 P:   -monitor CBG -cortisol stat -tsh  NEUROLOGIC A:  Pain control post surgery Sedation for vent synchrony required P:   RASS goal: -2 Fentanyl gtt Avoid benzo if  able  TODAY'S SUMMARY:  Worsening shock, bolus, levophed, send labs, for ct abdo, BC< empiric abx  I have personally obtained a history, examined the patient, evaluated laboratory and imaging results, formulated the assessment and plan and placed orders. CRITICAL CARE: The patient is critically ill with multiple organ systems failure and requires high complexity decision making for assessment and support, frequent evaluation and titration of therapies, application of advanced monitoring technologies and extensive interpretation of multiple databases. Critical Care Time devoted to patient care services described in this note is 50 minutes.    Mcarthur Rossetti. Tyson Alias, MD, FACP Pgr: [phone] [facility]

## 2013-11-07 NOTE — Progress Notes (Signed)
Pt had run of SVT rate 190s, then converted back to ST 130s.  Dr. Marin Shutter made aware.  Orders received.  Will continue to monitor.  Roselie Awkward, RN

## 2013-11-07 NOTE — Progress Notes (Signed)
RT note-placed back to full support for HR

## 2013-11-07 NOTE — Progress Notes (Signed)
Pt's SBP dropped to 80s, levo restarted per MD order.  Pt's HR now 120s ST.  Dr. Delton Coombes notified.  No new orders at this time.  Will continue to monitor.    Roselie Awkward, RN

## 2013-11-07 NOTE — Progress Notes (Signed)
SBP now dropping in 70s.  HR 130.  Levo turned up to 15.  Pt complaining of abdominal pain (this is where pain has been all night).  Dr. Delton Coombes notified of above information.  New orders received.  Will continue to monitor.  Roselie Awkward, RN

## 2013-11-07 NOTE — Progress Notes (Addendum)
   CT abd/pelvis reviewed. Nothing definitive noted.  Nothing on my review is obviously abnormal.  PO contrast does not opacify bowel, limiting diagnostic value of study.  Mechanism of EVAR mediated mesenteric ischemia include:  1.  Exclusion of IMA perfusion: Not possible as pt already was occluded at baseline in his IMA  2.  Thromboembolism of thrombus during EVAR: Unlikely as mechanism is usually a SMA occlusion by thrombus which immediately presents.  3.  Occlusion of hypogastric arteries: Not possible as both arteries patent on completion aortogram  4.  Watershed infarction due to hypotension: Possible but the distribution of pain is not consistent with such.  The watershed distribution for the bowel involves the splenic flexure, so the patient should have L side abdominal pain.  His pain is >> on the right side.  At this point, I'm not certain the etiology of his abdominal sx or the etiology of his persistent shock.  His amylase and lipase appeared to be somewhat elevated, so he might have mild pancreatitis.  Note, this patient presented to the ER baseline with abdominal pain, which I did not think was due to his AAA.  It is unclear to me if this is presentation of that underlying pathology.    - Will get [facility] to provide an additional opinion. - I updated the family of the patient's current status.  [name], MD [facility] Office: [phone] Pager: [phone]  11/07/2013, 4:28 PM

## 2013-11-07 NOTE — Progress Notes (Signed)
  Echocardiogram 2D Echocardiogram limited has been performed.  Hannelore Bova 11/07/2013, 11:18 AM

## 2013-11-07 NOTE — Consult Note (Signed)
Reason for Consult: Abd pain Referring Physician: Dr. Loyal Jacobson is an 80 y.o. male.  HPI: Pt currently intubated and all facts were obtained via chart review.  Pt was admitted secondary to abdominal pain felt to be due to AAA, found to have critical symptomatic aortic stenosis. Underwent endovascular AAA repair on 9/8 with valvuloplasty.    Post operatively pt was weaned off pressor support.  On POD #1 he required pressor support be restarted.  Concern of possible bowel ischemia and abd pain initiated CT A/P.  Pt's lactate level was min elev at 3 on POD #1.  Pt has not had any BMs after surgery.  Past Medical History  Diagnosis Date  . Hypertension   . Gout   . Glaucoma   . COPD (chronic obstructive pulmonary disease)     Past Surgical History  Procedure Laterality Date  . Total hip arthroplasty    . Lumbar disc surgery      Family History  Problem Relation Age of Onset  . Diabetes Brother   . Coronary artery disease Brother   . Stroke Brother   . Diabetes Mother     Social History:  reports that he quit smoking about 27 years ago. He does not have any smokeless tobacco history on file. He reports that he does not drink alcohol or use illicit drugs.  Allergies:  Allergies  Allergen Reactions  . Penicillins     unknown    Medications: I have reviewed the patient's current medications.  Results for orders placed during the hospital encounter of 10/24/2013 (from the past 48 hour(s))  BASIC METABOLIC PANEL     Status: Abnormal   Collection Time    11/10/2013  4:32 AM      Result Value Ref Range   Sodium 140  137 - 147 mEq/L   Potassium 3.9  3.7 - 5.3 mEq/L   Chloride 102  96 - 112 mEq/L   CO2 24  19 - 32 mEq/L   Glucose, Bld 92  70 - 99 mg/dL   BUN 17  6 - 23 mg/dL   Creatinine, Ser 0.86  0.50 - 1.35 mg/dL   Calcium 9.0  8.4 - 10.5 mg/dL   GFR calc non Af Amer 77 (*) >90 mL/min   GFR calc Af Amer 90 (*) >90 mL/min   Comment: (NOTE)     The eGFR has been  calculated using the CKD EPI equation.     This calculation has not been validated in all clinical situations.     eGFR's persistently <90 mL/min signify possible Chronic Kidney     Disease.   Anion gap 14  5 - 15  CBC     Status: Abnormal   Collection Time    11/19/2013  4:32 AM      Result Value Ref Range   WBC 6.5  4.0 - 10.5 K/uL   RBC 4.26  4.22 - 5.81 MIL/uL   Hemoglobin 13.3  13.0 - 17.0 g/dL   HCT 39.4  39.0 - 52.0 %   MCV 92.5  78.0 - 100.0 fL   MCH 31.2  26.0 - 34.0 pg   MCHC 33.8  30.0 - 36.0 g/dL   RDW 15.2  11.5 - 15.5 %   Platelets 100 (*) 150 - 400 K/uL   Comment: CONSISTENT WITH PREVIOUS RESULT  POCT I-STAT, CHEM 8     Status: Abnormal   Collection Time    11/04/2013  8:10 AM  Result Value Ref Range   Sodium 138  137 - 147 mEq/L   Potassium 3.9  3.7 - 5.3 mEq/L   Chloride 106  96 - 112 mEq/L   BUN 13  6 - 23 mg/dL   Creatinine, Ser 0.80  0.50 - 1.35 mg/dL   Glucose, Bld 100 (*) 70 - 99 mg/dL   Calcium, Ion 1.26  1.13 - 1.30 mmol/L   TCO2 29  0 - 100 mmol/L   Hemoglobin 13.3  13.0 - 17.0 g/dL   HCT 39.0  39.0 - 52.0 %  PREPARE RBC (CROSSMATCH)     Status: None   Collection Time    11/08/2013  9:11 AM      Result Value Ref Range   Order Confirmation ORDER PROCESSED BY BLOOD BANK    POCT I-STAT, CHEM 8     Status: Abnormal   Collection Time    11/27/2013  9:43 AM      Result Value Ref Range   Sodium 138  137 - 147 mEq/L   Potassium 4.0  3.7 - 5.3 mEq/L   Chloride 106  96 - 112 mEq/L   BUN 12  6 - 23 mg/dL   Creatinine, Ser 0.80  0.50 - 1.35 mg/dL   Glucose, Bld 108 (*) 70 - 99 mg/dL   Calcium, Ion 1.24  1.13 - 1.30 mmol/L   TCO2 26  0 - 100 mmol/L   Hemoglobin 11.9 (*) 13.0 - 17.0 g/dL   HCT 35.0 (*) 39.0 - 52.0 %  POCT I-STAT, CHEM 8     Status: Abnormal   Collection Time    11/10/2013 10:09 AM      Result Value Ref Range   Sodium 138  137 - 147 mEq/L   Potassium 4.0  3.7 - 5.3 mEq/L   Chloride 104  96 - 112 mEq/L   BUN 12  6 - 23 mg/dL    Creatinine, Ser 0.70  0.50 - 1.35 mg/dL   Glucose, Bld 106 (*) 70 - 99 mg/dL   Calcium, Ion 1.26  1.13 - 1.30 mmol/L   TCO2 26  0 - 100 mmol/L   Hemoglobin 11.6 (*) 13.0 - 17.0 g/dL   HCT 34.0 (*) 39.0 - 52.0 %  POCT I-STAT, CHEM 8     Status: Abnormal   Collection Time    10/30/2013 12:09 PM      Result Value Ref Range   Sodium 137  137 - 147 mEq/L   Potassium 4.2  3.7 - 5.3 mEq/L   Chloride 104  96 - 112 mEq/L   BUN 13  6 - 23 mg/dL   Creatinine, Ser 1.00  0.50 - 1.35 mg/dL   Glucose, Bld 103 (*) 70 - 99 mg/dL   Calcium, Ion 1.23  1.13 - 1.30 mmol/L   TCO2 24  0 - 100 mmol/L   Hemoglobin 9.5 (*) 13.0 - 17.0 g/dL   HCT 28.0 (*) 39.0 - 52.0 %  POCT I-STAT 3, ART BLOOD GAS (G3+)     Status: Abnormal   Collection Time    11/01/2013 12:14 PM      Result Value Ref Range   pH, Arterial 7.349 (*) 7.350 - 7.450   pCO2 arterial 44.2  35.0 - 45.0 mmHg   pO2, Arterial 156.0 (*) 80.0 - 100.0 mmHg   Bicarbonate 24.7 (*) 20.0 - 24.0 mEq/L   TCO2 26  0 - 100 mmol/L   O2 Saturation 99.0     Acid-base deficit  1.0  0.0 - 2.0 mmol/L   Patient temperature 35.8 C     Sample type ARTERIAL    CBC     Status: Abnormal   Collection Time    10/30/2013  1:50 PM      Result Value Ref Range   WBC 8.1  4.0 - 10.5 K/uL   RBC 2.78 (*) 4.22 - 5.81 MIL/uL   Hemoglobin 8.7 (*) 13.0 - 17.0 g/dL   Comment: RESULT CALLED TO, READ BACK BY AND VERIFIED WITH:     LOGAN KEY,RN AT 1408 11/02/2013 BY ZBEECH.   HCT 26.3 (*) 39.0 - 52.0 %   MCV 94.6  78.0 - 100.0 fL   MCH 31.3  26.0 - 34.0 pg   MCHC 33.1  30.0 - 36.0 g/dL   RDW 15.3  11.5 - 15.5 %   Platelets 67 (*) 150 - 400 K/uL   Comment: CONSISTENT WITH PREVIOUS RESULT  POCT I-STAT, CHEM 8     Status: Abnormal   Collection Time    11/24/2013  1:54 PM      Result Value Ref Range   Sodium 136 (*) 137 - 147 mEq/L   Potassium 4.8  3.7 - 5.3 mEq/L   Chloride 105  96 - 112 mEq/L   BUN 14  6 - 23 mg/dL   Creatinine, Ser 1.30  0.50 - 1.35 mg/dL   Glucose, Bld 115 (*)  70 - 99 mg/dL   Calcium, Ion 1.17  1.13 - 1.30 mmol/L   TCO2 24  0 - 100 mmol/L   Hemoglobin 8.2 (*) 13.0 - 17.0 g/dL   HCT 24.0 (*) 39.0 - 52.0 %  PREPARE PLATELET PHERESIS     Status: None   Collection Time    11/01/2013  2:11 PM      Result Value Ref Range   Unit Number [id number]     Blood Component Type PLTPHER LR1     Unit division 00     Status of Unit ISSUED,FINAL     Transfusion Status OK TO TRANSFUSE    POCT I-STAT 3, ART BLOOD GAS (G3+)     Status: Abnormal   Collection Time    11/24/2013  2:51 PM      Result Value Ref Range   pH, Arterial 7.319 (*) 7.350 - 7.450   pCO2 arterial 44.3  35.0 - 45.0 mmHg   pO2, Arterial 88.0  80.0 - 100.0 mmHg   Bicarbonate 22.8  20.0 - 24.0 mEq/L   TCO2 24  0 - 100 mmol/L   O2 Saturation 96.0     Acid-base deficit 3.0 (*) 0.0 - 2.0 mmol/L   Patient temperature 36.7 C     Collection site ARTERIAL LINE     Drawn by Nurse     Sample type ARTERIAL    CBC     Status: Abnormal   Collection Time    11/23/2013  3:00 PM      Result Value Ref Range   WBC 14.7 (*) 4.0 - 10.5 K/uL   RBC 3.39 (*) 4.22 - 5.81 MIL/uL   Hemoglobin 10.7 (*) 13.0 - 17.0 g/dL   Comment: REPEATED TO VERIFY   HCT 31.6 (*) 39.0 - 52.0 %   MCV 93.2  78.0 - 100.0 fL   MCH 31.6  26.0 - 34.0 pg   MCHC 33.9  30.0 - 36.0 g/dL   RDW 15.4  11.5 - 15.5 %   Platelets 63 (*) 150 - 400 K/uL  Comment: CONSISTENT WITH PREVIOUS RESULT  BASIC METABOLIC PANEL     Status: Abnormal   Collection Time    11/26/2013  3:00 PM      Result Value Ref Range   Sodium 139  137 - 147 mEq/L   Potassium 4.8  3.7 - 5.3 mEq/L   Chloride 105  96 - 112 mEq/L   CO2 21  19 - 32 mEq/L   Glucose, Bld 103 (*) 70 - 99 mg/dL   BUN 15  6 - 23 mg/dL   Creatinine, Ser 1.27  0.50 - 1.35 mg/dL   Calcium 7.4 (*) 8.4 - 10.5 mg/dL   GFR calc non Af Amer 50 (*) >90 mL/min   GFR calc Af Amer 58 (*) >90 mL/min   Comment: (NOTE)     The eGFR has been calculated using the CKD EPI equation.     This calculation  has not been validated in all clinical situations.     eGFR's persistently <90 mL/min signify possible Chronic Kidney     Disease.   Anion gap 13  5 - 15  APTT     Status: Abnormal   Collection Time    11/17/2013  3:00 PM      Result Value Ref Range   aPTT 61 (*) 24 - 37 seconds   Comment:            IF BASELINE aPTT IS ELEVATED,     SUGGEST PATIENT RISK ASSESSMENT     BE USED TO DETERMINE APPROPRIATE     ANTICOAGULANT THERAPY.  PROTIME-INR     Status: Abnormal   Collection Time    11/02/2013  3:00 PM      Result Value Ref Range   Prothrombin Time 22.4 (*) 11.6 - 15.2 seconds   INR 1.97 (*) 0.00 - 1.49  GLUCOSE, CAPILLARY     Status: Abnormal   Collection Time    11/12/2013  3:28 PM      Result Value Ref Range   Glucose-Capillary 100 (*) 70 - 99 mg/dL  TROPONIN I     Status: Abnormal   Collection Time    10/30/2013  4:14 PM      Result Value Ref Range   Troponin I 2.29 (*) <0.30 ng/mL   Comment:            Due to the release kinetics of cTnI,     a negative result within the first hours     of the onset of symptoms does not rule out     myocardial infarction with certainty.     If myocardial infarction is still suspected,     repeat the test at appropriate intervals.     CRITICAL RESULT CALLED TO, READ BACK BY AND VERIFIED WITH:     [facility] [id number] [place], CAPILLARY     Status: Abnormal   Collection Time    11/23/2013  4:33 PM      Result Value Ref Range   Glucose-Capillary 112 (*) 70 - 99 mg/dL  GLUCOSE, CAPILLARY     Status: Abnormal   Collection Time    11/19/2013  5:19 PM      Result Value Ref Range   Glucose-Capillary 111 (*) 70 - 99 mg/dL  GLUCOSE, CAPILLARY     Status: Abnormal   Collection Time    11/26/2013  6:18 PM      Result Value Ref Range   Glucose-Capillary 112 (*) 70 - 99 mg/dL  PREPARE FRESH FROZEN PLASMA     Status: None   Collection Time    11/25/2013  7:00 PM      Result Value Ref Range   Unit Number [id number]     Blood  Component Type THAWED PLASMA     Unit division 00     Status of Unit ISSUED,FINAL     Transfusion Status OK TO TRANSFUSE     Unit Number [id number]     Blood Component Type THAWED PLASMA     Unit division 00     Status of Unit ISSUED,FINAL     Transfusion Status OK TO TRANSFUSE    GLUCOSE, CAPILLARY     Status: Abnormal   Collection Time    11/15/2013  8:01 PM      Result Value Ref Range   Glucose-Capillary 133 (*) 70 - 99 mg/dL  CBC     Status: Abnormal   Collection Time    11/24/2013 11:15 PM      Result Value Ref Range   WBC 16.5 (*) 4.0 - 10.5 K/uL   RBC 3.44 (*) 4.22 - 5.81 MIL/uL   Hemoglobin 10.7 (*) 13.0 - 17.0 g/dL   HCT 31.6 (*) 39.0 - 52.0 %   MCV 91.9  78.0 - 100.0 fL   MCH 31.1  26.0 - 34.0 pg   MCHC 33.9  30.0 - 36.0 g/dL   RDW 15.7 (*) 11.5 - 15.5 %   Platelets 68 (*) 150 - 400 K/uL   Comment: CONSISTENT WITH PREVIOUS RESULT  TROPONIN I     Status: Abnormal   Collection Time    11/28/2013 11:15 PM      Result Value Ref Range   Troponin I 3.94 (*) <0.30 ng/mL   Comment:            Due to the release kinetics of cTnI,     a negative result within the first hours     of the onset of symptoms does not rule out     myocardial infarction with certainty.     If myocardial infarction is still suspected,     repeat the test at appropriate intervals.     CRITICAL VALUE NOTED.  VALUE IS CONSISTENT WITH PREVIOUSLY REPORTED AND CALLED VALUE.  GLUCOSE, CAPILLARY     Status: Abnormal   Collection Time    11/26/2013 11:18 PM      Result Value Ref Range   Glucose-Capillary 142 (*) 70 - 99 mg/dL  TROPONIN I     Status: Abnormal   Collection Time    11/07/13  4:00 AM      Result Value Ref Range   Troponin I 5.91 (*) <0.30 ng/mL   Comment:            Due to the release kinetics of cTnI,     a negative result within the first hours     of the onset of symptoms does not rule out     myocardial infarction with certainty.     If myocardial infarction is still suspected,      repeat the test at appropriate intervals.     CRITICAL VALUE NOTED.  VALUE IS CONSISTENT WITH PREVIOUSLY REPORTED AND CALLED VALUE.  CBC     Status: Abnormal   Collection Time    11/07/13  4:00 AM      Result Value Ref Range   WBC 16.3 (*) 4.0 - 10.5 K/uL   RBC 3.31 (*) 4.22 - 5.81  MIL/uL   Hemoglobin 10.4 (*) 13.0 - 17.0 g/dL   HCT 30.3 (*) 39.0 - 52.0 %   MCV 91.5  78.0 - 100.0 fL   MCH 31.4  26.0 - 34.0 pg   MCHC 34.3  30.0 - 36.0 g/dL   RDW 15.8 (*) 11.5 - 15.5 %   Platelets 73 (*) 150 - 400 K/uL   Comment: CONSISTENT WITH PREVIOUS RESULT  BASIC METABOLIC PANEL     Status: Abnormal   Collection Time    11/07/13  4:00 AM      Result Value Ref Range   Sodium 140  137 - 147 mEq/L   Potassium 4.3  3.7 - 5.3 mEq/L   Chloride 105  96 - 112 mEq/L   CO2 21  19 - 32 mEq/L   Glucose, Bld 97  70 - 99 mg/dL   BUN 24 (*) 6 - 23 mg/dL   Creatinine, Ser 2.12 (*) 0.50 - 1.35 mg/dL   Comment: DELTA CHECK NOTED   Calcium 7.9 (*) 8.4 - 10.5 mg/dL   GFR calc non Af Amer 27 (*) >90 mL/min   GFR calc Af Amer 31 (*) >90 mL/min   Comment: (NOTE)     The eGFR has been calculated using the CKD EPI equation.     This calculation has not been validated in all clinical situations.     eGFR's persistently <90 mL/min signify possible Chronic Kidney     Disease.   Anion gap 14  5 - 15  GLUCOSE, CAPILLARY     Status: None   Collection Time    11/07/13  4:03 AM      Result Value Ref Range   Glucose-Capillary 96  70 - 99 mg/dL  POCT I-STAT 3, ART BLOOD GAS (G3+)     Status: Abnormal   Collection Time    11/07/13  4:06 AM      Result Value Ref Range   pH, Arterial 7.361  7.350 - 7.450   pCO2 arterial 38.4  35.0 - 45.0 mmHg   pO2, Arterial 81.0  80.0 - 100.0 mmHg   Bicarbonate 21.5  20.0 - 24.0 mEq/L   TCO2 23  0 - 100 mmol/L   O2 Saturation 95.0     Acid-base deficit 3.0 (*) 0.0 - 2.0 mmol/L   Patient temperature 38.0 C     Collection site ARTERIAL LINE     Drawn by Nurse     Sample type  ARTERIAL    GLUCOSE, CAPILLARY     Status: None   Collection Time    11/07/13  7:48 AM      Result Value Ref Range   Glucose-Capillary 96  70 - 99 mg/dL  CBC     Status: Abnormal   Collection Time    11/07/13  8:15 AM      Result Value Ref Range   WBC 19.6 (*) 4.0 - 10.5 K/uL   RBC 3.24 (*) 4.22 - 5.81 MIL/uL   Hemoglobin 10.4 (*) 13.0 - 17.0 g/dL   HCT 30.4 (*) 39.0 - 52.0 %   MCV 93.8  78.0 - 100.0 fL   MCH 32.1  26.0 - 34.0 pg   MCHC 34.2  30.0 - 36.0 g/dL   RDW 16.1 (*) 11.5 - 15.5 %   Platelets 71 (*) 150 - 400 K/uL   Comment: CONSISTENT WITH PREVIOUS RESULT  APTT     Status: Abnormal   Collection Time    11/07/13  8:56 AM  Result Value Ref Range   aPTT 42 (*) 24 - 37 seconds   Comment:            IF BASELINE aPTT IS ELEVATED,     SUGGEST PATIENT RISK ASSESSMENT     BE USED TO DETERMINE APPROPRIATE     ANTICOAGULANT THERAPY.  PROTIME-INR     Status: Abnormal   Collection Time    11/07/13  8:56 AM      Result Value Ref Range   Prothrombin Time 19.4 (*) 11.6 - 15.2 seconds   INR 1.64 (*) 0.00 - 1.49  LACTIC ACID, PLASMA     Status: Abnormal   Collection Time    11/07/13 10:00 AM      Result Value Ref Range   Lactic Acid, Venous 3.0 (*) 0.5 - 2.2 mmol/L  LACTIC ACID, PLASMA     Status: Abnormal   Collection Time    11/07/13 11:00 AM      Result Value Ref Range   Lactic Acid, Venous 3.0 (*) 0.5 - 2.2 mmol/L  LACTATE DEHYDROGENASE     Status: Abnormal   Collection Time    11/07/13 11:00 AM      Result Value Ref Range   LDH 942 (*) 94 - 250 U/L  AMYLASE     Status: Abnormal   Collection Time    11/07/13 11:00 AM      Result Value Ref Range   Amylase 430 (*) 0 - 105 U/L  LIPASE, BLOOD     Status: Abnormal   Collection Time    11/07/13 11:00 AM      Result Value Ref Range   Lipase 69 (*) 11 - 59 U/L  BASIC METABOLIC PANEL     Status: Abnormal   Collection Time    11/07/13 11:00 AM      Result Value Ref Range   Sodium 139  137 - 147 mEq/L   Potassium  4.9  3.7 - 5.3 mEq/L   Chloride 107  96 - 112 mEq/L   CO2 19  19 - 32 mEq/L   Glucose, Bld 103 (*) 70 - 99 mg/dL   BUN 26 (*) 6 - 23 mg/dL   Creatinine, Ser 2.31 (*) 0.50 - 1.35 mg/dL   Calcium 7.4 (*) 8.4 - 10.5 mg/dL   GFR calc non Af Amer 24 (*) >90 mL/min   GFR calc Af Amer 28 (*) >90 mL/min   Comment: (NOTE)     The eGFR has been calculated using the CKD EPI equation.     This calculation has not been validated in all clinical situations.     eGFR's persistently <90 mL/min signify possible Chronic Kidney     Disease.   Anion gap 13  5 - 15  FIBRINOGEN     Status: None   Collection Time    11/07/13 11:00 AM      Result Value Ref Range   Fibrinogen 284  204 - 475 mg/dL  APTT     Status: Abnormal   Collection Time    11/07/13 11:00 AM      Result Value Ref Range   aPTT 40 (*) 24 - 37 seconds   Comment:            IF BASELINE aPTT IS ELEVATED,     SUGGEST PATIENT RISK ASSESSMENT     BE USED TO DETERMINE APPROPRIATE     ANTICOAGULANT THERAPY.  PROTIME-INR     Status: Abnormal   Collection Time  11/07/13 11:00 AM      Result Value Ref Range   Prothrombin Time 19.5 (*) 11.6 - 15.2 seconds   INR 1.65 (*) 0.00 - 1.49  POCT I-STAT 3, ART BLOOD GAS (G3+)     Status: Abnormal   Collection Time    11/07/13 11:01 AM      Result Value Ref Range   pH, Arterial 7.300 (*) 7.350 - 7.450   pCO2 arterial 42.1  35.0 - 45.0 mmHg   pO2, Arterial 63.0 (*) 80.0 - 100.0 mmHg   Bicarbonate 20.5  20.0 - 24.0 mEq/L   TCO2 22  0 - 100 mmol/L   O2 Saturation 88.0     Acid-base deficit 5.0 (*) 0.0 - 2.0 mmol/L   Patient temperature 37.6 C     Sample type ARTERIAL    TSH     Status: None   Collection Time    11/07/13 11:30 AM      Result Value Ref Range   TSH 4.390  0.350 - 4.500 uIU/mL  GLUCOSE, CAPILLARY     Status: None   Collection Time    11/07/13 12:07 PM      Result Value Ref Range   Glucose-Capillary 90  70 - 99 mg/dL  PREPARE FRESH FROZEN PLASMA     Status: None   Collection  Time    11/07/13  1:00 PM      Result Value Ref Range   Unit Number [id number]     Blood Component Type THAWED PLASMA     Unit division 00     Status of Unit ISSUED     Transfusion Status OK TO TRANSFUSE    GLUCOSE, CAPILLARY     Status: Abnormal   Collection Time    11/07/13  4:46 PM      Result Value Ref Range   Glucose-Capillary 113 (*) 70 - 99 mg/dL   Comment 1 Notify RN      Ct Abdomen Pelvis Wo Contrast  11/07/2013   CLINICAL DATA:  Status post aortic endograft placement on 11/05/2013. Clinical concern for bowel ischemia.  EXAM: CT ABDOMEN AND PELVIS WITHOUT CONTRAST  TECHNIQUE: Multidetector CT imaging of the abdomen and pelvis was performed following the standard protocol without IV contrast.  COMPARISON:  10/28/2013.  FINDINGS: Lung Bases: Emphysema with bilateral lower lobe collapse/ consolidation, left greater than right. Contrast material in the lower esophagus may be related to reflux or dysmotility.  Liver:  Normal uninfused appearance.  Spleen: Normal on infused features.  Stomach: Distended. NG tube tip is in the fundus of the stomach. No gastric wall thickening.  Pancreas: No mass lesion is evident. No dilatation of the main duct.  Gallbladder/Biliary: High attenuation material in the lumen compatible with vicarious excretion of contrast. No intra or extrahepatic biliary duct dilatation.  Kidneys/Adrenals: No adrenal nodule or mass. Residual contrast material is identified in the cortices of both kidneys in a heterogeneous configuration.  Bowel Loops: Duodenum is normally positioned as is the ligament of Treitz. No evidence for small bowel dilatation. The mid and distal small bowel is opacified in this along with lack of intravenous contrast limits assessment for bowel wall thickening. No gross perienteric edema is evident. There is no evidence for small bowel pneumatosis. There is some pericecal edema/inflammation although no substantial cecal wall thickening is evident. No  evidence for colonic pneumatosis. Diverticular changes are noted in the sigmoid colon.  Nodes: No evidence for lymphadenopathy to in the abdomen or pelvis  Vasculature:  Aortic endo graft noted within large abdominal aortic aneurysm measuring up to 8.6 cm in diameter. Gas within the lumen of the aorta and external to the stent graft is most likely related to the recent stent placement.  Pelvic Genitourinary: Streak artifact through the lower pelvis from the patient's bilateral hip replacements obscures lower pelvic anatomy.  Bones/Musculoskeletal: Bilateral hip replacement noted. No worrisome lytic or sclerotic osseous abnormality  Body Wall: No evidence for abdominal wall hernia. There is body wall edema in the lower abdomen and pelvis.  Other: There may be a trace amount of free fluid in the pelvis.  IMPRESSION: No definite CT evidence for bowel ischemia. Study is limited by incomplete opacification of small and large bowel and lack of intravenous contrast material, but no pneumatosis is evident. There is a small amount of edema or inflammation around the cecum, but no associated cecal wall thickening or pneumatosis is evident.   Electronically Signed   By: Misty Stanley M.D.   On: 11/07/2013 14:40   Dg Chest Port 1 View  11/07/2013   CLINICAL DATA:  Status post EVAR and aortic valvuloplasty.  EXAM: PORTABLE CHEST - 1 VIEW  COMPARISON:  11/09/2013  FINDINGS: Endotracheal tube remains with the tip approximately 3.7 cm above the carina. Swan-Ganz catheter continues to show coiled configuration in the main pulmonary artery. Additional jugular central line tip is in the SVC. Relatively stable bilateral lower lobe atelectasis present, left greater than right. Probable small left pleural effusion remains. No pulmonary edema, pneumothorax or pneumomediastinum. Cardiac and mediastinal contours are stable.  IMPRESSION: No pneumothorax. Stable bilateral lower lobe atelectasis. Stable probable small left pleural effusion.    Electronically Signed   By: Aletta Edouard M.D.   On: 11/07/2013 08:21   Dg Chest Portable 1 View  10/30/2013   CLINICAL DATA:  Postop for stent graft placement.  EXAM: PORTABLE CHEST - 1 VIEW  COMPARISON:  10/12/2013  FINDINGS: Endotracheal tube is 3.5 cm above the carina. There are two right jugular central lines. One central line is in the SVC region. The other is a Swan-Ganz catheter which appears to be coiled in the main pulmonary outflow tract region. Few patchy densities at the left lung base are most compatible with atelectasis. No evidence for a pneumothorax.  IMPRESSION: Left basilar atelectasis.  Negative for a pneumothorax.  Swan-Ganz catheter appears to be coiled in the main pulmonary artery region.   Electronically Signed   By: Markus Daft M.D.   On: 11/14/2013 15:58   Dg Abd Portable 1v  11/07/2013   CLINICAL DATA:  80 year old male with abdominal pain greater on the right side. Initial encounter.  EXAM: PORTABLE ABDOMEN - 1 VIEW  COMPARISON:  CTA abdomen and pelvis 10/13/2013. Portable chest radiograph from 0641 hr today.  FINDINGS: Portable AP supine view at 0936 hr.  New bifurcated abdominal aortic endograft. Large rim calcified native abdominal aortic aneurysm sac.  Non obstructed bowel gas pattern. Enteric tube looped in the left upper quadrant at the level of the stomach. Partially visible Swan-Ganz catheter. Bilateral hip arthroplasty hardware partially visible. Increased left lung base opacity, as seen earlier today.  IMPRESSION: 1. Non obstructed bowel gas pattern. Enteric tube looped in the region of the stomach. 2. Left lung base consolidation or collapse. 3. New abdominal aortic endograft since 10/25/2013, large rim calcified native aneurysm sac.   Electronically Signed   By: Lars Pinks M.D.   On: 11/07/2013 09:55    Review of Systems  Constitutional:  Negative for weight loss.  HENT: Negative for ear discharge, ear pain, hearing loss and tinnitus.   Eyes: Negative for blurred  vision, double vision, photophobia and pain.  Respiratory: Negative for cough, sputum production and shortness of breath.   Cardiovascular: Negative for chest pain.  Gastrointestinal: Positive for abdominal pain. Negative for nausea and vomiting.  Genitourinary: Negative for dysuria, urgency, frequency and flank pain.  Musculoskeletal: Negative for back pain, falls, joint pain, myalgias and neck pain.  Neurological: Negative for dizziness, tingling, sensory change, focal weakness, loss of consciousness and headaches.  Endo/Heme/Allergies: Does not bruise/bleed easily.  Psychiatric/Behavioral: Negative for depression, memory loss and substance abuse. The patient is not nervous/anxious.    Blood pressure 109/71, pulse 131, temperature 99.7 F (37.6 C), temperature source Core (Comment), resp. rate 20, height 5' 10"  (1.778 m), weight 170 lb 6.7 oz (77.3 kg), SpO2 94.00%. Physical Exam  Constitutional: He is oriented to person, place, and time. He appears well-developed and well-nourished.  HENT:  Head: Normocephalic and atraumatic.  Eyes: Pupils are equal, round, and reactive to light.  Neck: Normal range of motion. Neck supple.  Cardiovascular: Tachycardia present.   Respiratory: Breath sounds normal.  On Vent   GI: Soft. He exhibits no distension and no mass. There is tenderness (generalized). There is no rebound and no guarding.  Musculoskeletal: Normal range of motion.  Neurological: He is alert and oriented to person, place, and time.    Assessment/Plan: 80 y/o M with abdominal pain s/p EVAR for AAA  Patient Active Problem List   Diagnosis Date Noted  . Cardiogenic shock 11/07/2013  . Protein-calorie malnutrition, severe 11/15/2013  . Critical aortic valve stenosis 11/25/2013  . AAA (abdominal aortic aneurysm) without rupture 10/14/2013   It is possible pt could have abd pain related to ischemic bowel, he is tender but does not have any overt signs of peritonitis currently.   Recent CT scan results show signs of ischemic bowel.  Pt's base deficit has not increased since this AM even with increased pressor support.  Pts HCO3 also min changed at this time  Lactate level currently pending.  Pt with no bloody BMs since surgery.  Pt with large stool burden on CT scan.  At this time I do not think he requires emergent exploration.  If he were to require increasing pressor support or produce a bloody BM pt would likely require an dx lap vs. Ex lap.  Will con't to monitor the patient.  Thank you for the consult  Rosario Jacks., Anne Hahn 11/07/2013, 5:32 PM

## 2013-11-07 NOTE — Progress Notes (Addendum)
Patient Name: Shawn Mathews Date of Encounter: 11/07/2013  Active Problems:   AAA (abdominal aortic aneurysm) without rupture   Protein-calorie malnutrition, severe   Critical aortic valve stenosis    Patient Profile: 80 yo male seen by Dr. Shana Chute, found to have abd mass and SEM, dx  AAA and severe AS dx 08/31, had cath 09/02 w/ non-obstructive dz,  balloon Ao valvuloplasty and AAA repair 09/08. Overnight after surgery, cardiac ez increased, hypotensive req increased pressors, WBC trending up.  SUBJECTIVE: Pt responds to verbal, on the vent, denies chest pain, c/o abd pain.  OBJECTIVE Filed Vitals:   11/07/13 0729 11/07/13 0745 11/07/13 0800 11/07/13 0900  BP:   103/70 103/71  Pulse:  128 132 128  Temp:  99.5 F (37.5 C) 99.7 F (37.6 C) 99.9 F (37.7 C)  TempSrc:      Resp:  Height:      Weight:      SpO2: 96% 94% 93% 94%    Intake/Output Summary (Last 24 hours) at 11/07/13 0952 Last data filed at 11/07/13 0800  Gross per 24 hour  Intake 6943.7 ml  Output   1885 ml  Net 5058.7 ml   Filed Weights   11/05/13 1700 11/24/2013 1500 11/07/13 0600  Weight: 155 lb 14.4 oz (70.716 kg) 155 lb 10.3 oz (70.6 kg) 170 lb 6.7 oz (77.3 kg)    PHYSICAL EXAM General: Intubated, awake.  Head: Normocephalic, atraumatic.  Neck: Supple without bruits, JVD not elevated. Lungs:  Resp regular and unlabored, rales anteriorly. Heart: Tachy, S1, S2, no S3, S4, soft murmur; no rub. Abdomen: firm, tender, distended, BS not appreciated.  Extremities: No clubbing, cyanosis, no edema.  Neuro: Alert and oriented X 3. Moves all extremities spontaneously. Psych: Normal affect.  LABS: CBC: Recent Labs  11/07/13 0400 11/07/13 0815  WBC 16.3* 19.6*  HGB 10.4* 10.4*  HCT 30.3* 30.4*  MCV 91.5 93.8  PLT 73* 71*   INR: Recent Labs  11/07/13 0856  INR 1.64*   Basic Metabolic Panel: Recent Labs  11/08/2013 1500 11/07/13 0400  NA 139 140  K 4.8 4.3  CL 105 105  CO2 21 21   GLUCOSE 103* 97  BUN 15 24*  CREATININE 1.27 2.12*  CALCIUM 7.4* 7.9*   Cardiac Enzymes: Recent Labs  11/01/2013 1614 11/24/2013 2315 11/07/13 0400  TROPONINI 2.29* 3.94* 5.91*   TELE: SR, ST, HR trending up overnight.  Radiology/Studies: Dg Chest Port 1 View 11/07/2013   CLINICAL DATA:  Status post EVAR and aortic valvuloplasty.  EXAM: PORTABLE CHEST - 1 VIEW  COMPARISON:  11/24/2013  FINDINGS: Endotracheal tube remains with the tip approximately 3.7 cm above the carina. Swan-Ganz catheter continues to show coiled configuration in the main pulmonary artery. Additional jugular central line tip is in the SVC. Relatively stable bilateral lower lobe atelectasis present, left greater than right. Probable small left pleural effusion remains. No pulmonary edema, pneumothorax or pneumomediastinum. Cardiac and mediastinal contours are stable.  IMPRESSION: No pneumothorax. Stable bilateral lower lobe atelectasis. Stable probable small left pleural effusion.   Electronically Signed   By: Irish Lack M.D.   On: 11/07/2013 08:21   Dg Chest Portable 1 View 11/04/2013   CLINICAL DATA:  Postop for stent graft placement.  EXAM: PORTABLE CHEST - 1 VIEW  COMPARISON:  10/20/2013  FINDINGS: Endotracheal tube is 3.5 cm above the carina. There are two right jugular central lines. One central line is in the SVC  region. The other is a Swan-Ganz catheter which appears to be coiled in the main pulmonary outflow tract region. Few patchy densities at the left lung base are most compatible with atelectasis. No evidence for a pneumothorax.  IMPRESSION: Left basilar atelectasis.  Negative for a pneumothorax.  Swan-Ganz catheter appears to be coiled in the main pulmonary artery region.   Electronically Signed   By: Richarda Overlie M.D.   On: 11/20/2013 15:58     Current Medications:  . sodium chloride   Intravenous Once  . allopurinol  100 mg Oral Daily  . amLODipine  5 mg Oral Daily  . antiseptic oral rinse  7 mL Mouth Rinse  QID  . chlorhexidine  15 mL Mouth Rinse BID  . docusate sodium  100 mg Oral Daily  . dorzolamide  1 drop Both Eyes BID  . feeding supplement (ENSURE COMPLETE)  237 mL Oral BID BM  . fentaNYL  50 mcg Intravenous Once  . insulin aspart  0-9 Units Subcutaneous 6 times per day  . ipratropium-albuterol  3 mL Nebulization Q6H  . pantoprazole  40 mg Oral Daily  . vancomycin  500 mg Intravenous Q24H   . sodium chloride 75 mL/hr at 11/07/13 0800  . dexmedetomidine Stopped (11/12/2013 2000)  . fentaNYL infusion INTRAVENOUS 125 mcg/hr (11/07/13 0800)  . norepinephrine (LEVOPHED) Adult infusion 11 mcg/min (11/07/13 0828)  . phenylephrine (NEO-SYNEPHRINE) Adult infusion Stopped (11/12/2013 1515)    ASSESSMENT AND PLAN: Active Problems:   AAA (abdominal aortic aneurysm) without rupture   Protein-calorie malnutrition, severe   Critical aortic valve stenosis  See full note below.  Signed, Theodore Demark , PA-C 9:52 AM 11/07/2013  I have personally seen and examined this patient with Theodore Demark, PA-C. I agree with the assessment as above.   1. Critical aortic valve stenosis: s/p balloon aortic valvuloplasty per Dr. [name] 11/24/2013. Now with hypotension, tachycardia.   2. AAA: s/p EVAR 11/04/2013.   3. Shock: Pt hypotensive this am with tachycardia. Still intubated but c/o abdominal pain. Troponin is rising. Renal function is worsening. Unclear etiology of shock at this time. Pt with minimal CAD by cath last week. LV function has been normal. Now s/p balloon aortic valvuloplasty. Concern for intra-abdominal process. This could represent mesenteric ischemia post EVAR vs intra-abdominal bleeding. H/H is stable so active bleeding is less likely. Will get stat Echo to assess aortic valve, LV function. Will give fluid bolus. PCCM at bedside.   Addendum 11/07/13 at 12:13pm: Echo with normal LV function, small non-clinically significant effusion, no signficant AI.   MCALHANY,CHRISTOPHER 11/07/2013 12:13 PM

## 2013-11-08 ENCOUNTER — Inpatient Hospital Stay (HOSPITAL_COMMUNITY): Payer: Medicare Other

## 2013-11-08 ENCOUNTER — Encounter (HOSPITAL_COMMUNITY): Payer: Self-pay | Admitting: Vascular Surgery

## 2013-11-08 DIAGNOSIS — R579 Shock, unspecified: Secondary | ICD-10-CM

## 2013-11-08 DIAGNOSIS — J9600 Acute respiratory failure, unspecified whether with hypoxia or hypercapnia: Secondary | ICD-10-CM

## 2013-11-08 LAB — CBC WITH DIFFERENTIAL/PLATELET
BASOS ABS: 0 10*3/uL (ref 0.0–0.1)
BASOS ABS: 0 10*3/uL (ref 0.0–0.1)
BASOS PCT: 0 % (ref 0–1)
Basophils Relative: 0 % (ref 0–1)
EOS ABS: 0 10*3/uL (ref 0.0–0.7)
Eosinophils Absolute: 0 10*3/uL (ref 0.0–0.7)
Eosinophils Relative: 0 % (ref 0–5)
Eosinophils Relative: 0 % (ref 0–5)
HCT: 29 % — ABNORMAL LOW (ref 39.0–52.0)
HCT: 29.9 % — ABNORMAL LOW (ref 39.0–52.0)
HEMOGLOBIN: 10.2 g/dL — AB (ref 13.0–17.0)
Hemoglobin: 9.7 g/dL — ABNORMAL LOW (ref 13.0–17.0)
Lymphocytes Relative: 3 % — ABNORMAL LOW (ref 12–46)
Lymphocytes Relative: 4 % — ABNORMAL LOW (ref 12–46)
Lymphs Abs: 0.5 10*3/uL — ABNORMAL LOW (ref 0.7–4.0)
Lymphs Abs: 0.7 10*3/uL (ref 0.7–4.0)
MCH: 30.7 pg (ref 26.0–34.0)
MCH: 32.1 pg (ref 26.0–34.0)
MCHC: 33.4 g/dL (ref 30.0–36.0)
MCHC: 34.1 g/dL (ref 30.0–36.0)
MCV: 91.8 fL (ref 78.0–100.0)
MCV: 94 fL (ref 78.0–100.0)
MONO ABS: 0.8 10*3/uL (ref 0.1–1.0)
Monocytes Absolute: 1.3 10*3/uL — ABNORMAL HIGH (ref 0.1–1.0)
Monocytes Relative: 5 % (ref 3–12)
Monocytes Relative: 7 % (ref 3–12)
NEUTROS ABS: 16.1 10*3/uL — AB (ref 1.7–7.7)
Neutro Abs: 15 10*3/uL — ABNORMAL HIGH (ref 1.7–7.7)
Neutrophils Relative %: 90 % — ABNORMAL HIGH (ref 43–77)
Neutrophils Relative %: 91 % — ABNORMAL HIGH (ref 43–77)
PLATELETS: 74 10*3/uL — AB (ref 150–400)
Platelets: 70 10*3/uL — ABNORMAL LOW (ref 150–400)
RBC: 3.16 MIL/uL — AB (ref 4.22–5.81)
RBC: 3.18 MIL/uL — ABNORMAL LOW (ref 4.22–5.81)
RDW: 16.2 % — AB (ref 11.5–15.5)
RDW: 16.2 % — ABNORMAL HIGH (ref 11.5–15.5)
WBC MORPHOLOGY: INCREASED
WBC Morphology: INCREASED
WBC: 16.5 10*3/uL — ABNORMAL HIGH (ref 4.0–10.5)
WBC: 17.9 10*3/uL — ABNORMAL HIGH (ref 4.0–10.5)

## 2013-11-08 LAB — BASIC METABOLIC PANEL
ANION GAP: 15 (ref 5–15)
Anion gap: 17 — ABNORMAL HIGH (ref 5–15)
Anion gap: 15 (ref 5–15)
BUN: 29 mg/dL — ABNORMAL HIGH (ref 6–23)
BUN: 34 mg/dL — AB (ref 6–23)
BUN: 36 mg/dL — AB (ref 6–23)
CHLORIDE: 108 meq/L (ref 96–112)
CHLORIDE: 108 meq/L (ref 96–112)
CO2: 16 meq/L — AB (ref 19–32)
CO2: 17 meq/L — AB (ref 19–32)
CO2: 18 meq/L — AB (ref 19–32)
CREATININE: 3.01 mg/dL — AB (ref 0.50–1.35)
CREATININE: 3.02 mg/dL — AB (ref 0.50–1.35)
Calcium: 6.7 mg/dL — ABNORMAL LOW (ref 8.4–10.5)
Calcium: 6.9 mg/dL — ABNORMAL LOW (ref 8.4–10.5)
Calcium: 7.5 mg/dL — ABNORMAL LOW (ref 8.4–10.5)
Chloride: 106 mEq/L (ref 96–112)
Creatinine, Ser: 2.73 mg/dL — ABNORMAL HIGH (ref 0.50–1.35)
GFR calc Af Amer: 20 mL/min — ABNORMAL LOW (ref >90)
GFR calc Af Amer: 20 mL/min — ABNORMAL LOW (ref >90)
GFR calc Af Amer: 23 mL/min — ABNORMAL LOW (ref >90)
GFR calc non Af Amer: 18 mL/min — ABNORMAL LOW (ref >90)
GFR calc non Af Amer: 18 mL/min — ABNORMAL LOW (ref >90)
GFR calc non Af Amer: 20 mL/min — ABNORMAL LOW (ref >90)
GLUCOSE: 112 mg/dL — AB (ref 70–99)
GLUCOSE: 124 mg/dL — AB (ref 70–99)
Glucose, Bld: 114 mg/dL — ABNORMAL HIGH (ref 70–99)
Potassium: 4.4 mEq/L (ref 3.7–5.3)
Potassium: 4.7 mEq/L (ref 3.7–5.3)
Potassium: 4.7 mEq/L (ref 3.7–5.3)
SODIUM: 139 meq/L (ref 137–147)
Sodium: 141 mEq/L (ref 137–147)
Sodium: 140 mEq/L (ref 137–147)

## 2013-11-08 LAB — COMPREHENSIVE METABOLIC PANEL
ALT: 59 U/L — AB (ref 0–53)
AST: 111 U/L — ABNORMAL HIGH (ref 0–37)
Albumin: 2.3 g/dL — ABNORMAL LOW (ref 3.5–5.2)
Alkaline Phosphatase: 44 U/L (ref 39–117)
Anion gap: 16 — ABNORMAL HIGH (ref 5–15)
BUN: 30 mg/dL — ABNORMAL HIGH (ref 6–23)
CO2: 17 meq/L — AB (ref 19–32)
CREATININE: 2.74 mg/dL — AB (ref 0.50–1.35)
Calcium: 7 mg/dL — ABNORMAL LOW (ref 8.4–10.5)
Chloride: 107 mEq/L (ref 96–112)
GFR, EST AFRICAN AMERICAN: 23 mL/min — AB (ref >90)
GFR, EST NON AFRICAN AMERICAN: 20 mL/min — AB (ref >90)
GLUCOSE: 130 mg/dL — AB (ref 70–99)
Potassium: 4.6 mEq/L (ref 3.7–5.3)
SODIUM: 140 meq/L (ref 137–147)
Total Bilirubin: 1.2 mg/dL (ref 0.3–1.2)
Total Protein: 4.7 g/dL — ABNORMAL LOW (ref 6.0–8.3)

## 2013-11-08 LAB — POCT I-STAT 3, ART BLOOD GAS (G3+)
ACID-BASE DEFICIT: 7 mmol/L — AB (ref 0.0–2.0)
ACID-BASE DEFICIT: 11 mmol/L — AB (ref 0.0–2.0)
Acid-base deficit: 10 mmol/L — ABNORMAL HIGH (ref 0.0–2.0)
BICARBONATE: 17 meq/L — AB (ref 20.0–24.0)
BICARBONATE: 14.6 meq/L — AB (ref 20.0–24.0)
BICARBONATE: 15.1 meq/L — AB (ref 20.0–24.0)
O2 Saturation: 93 %
O2 Saturation: 91 %
O2 Saturation: 92 %
PCO2 ART: 30.8 mmHg — AB (ref 35.0–45.0)
PH ART: 7.304 — AB (ref 7.350–7.450)
PO2 ART: 70 mmHg — AB (ref 80.0–100.0)
PO2 ART: 72 mmHg — AB (ref 80.0–100.0)
Patient temperature: 37.8
Patient temperature: 37.8
TCO2: 18 mmol/L (ref 0–100)
TCO2: 16 mmol/L (ref 0–100)
TCO2: 16 mmol/L (ref 0–100)
pCO2 arterial: 29.8 mmHg — ABNORMAL LOW (ref 35.0–45.0)
pCO2 arterial: 32.9 mmHg — ABNORMAL LOW (ref 35.0–45.0)
pH, Arterial: 7.368 (ref 7.350–7.450)
pH, Arterial: 7.259 — ABNORMAL LOW (ref 7.350–7.450)
pO2, Arterial: 71 mmHg — ABNORMAL LOW (ref 80.0–100.0)

## 2013-11-08 LAB — GLUCOSE, CAPILLARY
GLUCOSE-CAPILLARY: 128 mg/dL — AB (ref 70–99)
GLUCOSE-CAPILLARY: 107 mg/dL — AB (ref 70–99)
Glucose-Capillary: 105 mg/dL — ABNORMAL HIGH (ref 70–99)
Glucose-Capillary: 108 mg/dL — ABNORMAL HIGH (ref 70–99)
Glucose-Capillary: 109 mg/dL — ABNORMAL HIGH (ref 70–99)
Glucose-Capillary: 116 mg/dL — ABNORMAL HIGH (ref 70–99)
Glucose-Capillary: 97 mg/dL (ref 70–99)

## 2013-11-08 LAB — PREPARE FRESH FROZEN PLASMA: Unit division: 0

## 2013-11-08 LAB — CORTISOL: CORTISOL PLASMA: 55.7 ug/dL

## 2013-11-08 LAB — AMYLASE: Amylase: 476 U/L — ABNORMAL HIGH (ref 0–105)

## 2013-11-08 LAB — APTT: APTT: 54 s — AB (ref 24–37)

## 2013-11-08 LAB — LIPASE, BLOOD: Lipase: 33 U/L (ref 11–59)

## 2013-11-08 LAB — PROTIME-INR
INR: 2.08 — ABNORMAL HIGH (ref 0.00–1.49)
Prothrombin Time: 23.4 seconds — ABNORMAL HIGH (ref 11.6–15.2)

## 2013-11-08 LAB — VANCOMYCIN, RANDOM: Vancomycin Rm: 8.3 ug/mL

## 2013-11-08 LAB — LACTIC ACID, PLASMA: Lactic Acid, Venous: 2.6 mmol/L — ABNORMAL HIGH (ref 0.5–2.2)

## 2013-11-08 MED ORDER — SODIUM CHLORIDE 0.9 % IV BOLUS (SEPSIS)
500.0000 mL | Freq: Once | INTRAVENOUS | Status: AC
  Administered 2013-11-08: 500 mL via INTRAVENOUS

## 2013-11-08 MED ORDER — PANTOPRAZOLE SODIUM 40 MG IV SOLR
40.0000 mg | INTRAVENOUS | Status: DC
  Administered 2013-11-08 – 2013-11-19 (×12): 40 mg via INTRAVENOUS
  Filled 2013-11-08 (×17): qty 40

## 2013-11-08 MED ORDER — PIPERACILLIN-TAZOBACTAM IN DEX 2-0.25 GM/50ML IV SOLN
2.2500 g | Freq: Three times a day (TID) | INTRAVENOUS | Status: DC
  Administered 2013-11-08 – 2013-11-20 (×35): 2.25 g via INTRAVENOUS
  Filled 2013-11-08 (×38): qty 50

## 2013-11-08 MED ORDER — PHENYLEPHRINE HCL 10 MG/ML IJ SOLN
30.0000 ug/min | INTRAVENOUS | Status: DC
  Administered 2013-11-08: 30 ug/min via INTRAVENOUS
  Filled 2013-11-08: qty 2

## 2013-11-08 MED ORDER — PHENYLEPHRINE HCL 10 MG/ML IJ SOLN
30.0000 ug/min | INTRAVENOUS | Status: DC
  Administered 2013-11-08: 174.933 ug/min via INTRAVENOUS
  Administered 2013-11-08: 175 ug/min via INTRAVENOUS
  Administered 2013-11-08: 190 ug/min via INTRAVENOUS
  Administered 2013-11-08: 185 ug/min via INTRAVENOUS
  Administered 2013-11-08: 175 ug/min via INTRAVENOUS
  Administered 2013-11-09: 65 ug/min via INTRAVENOUS
  Administered 2013-11-09: 190 ug/min via INTRAVENOUS
  Administered 2013-11-09: 110 ug/min via INTRAVENOUS
  Administered 2013-11-09: 175 ug/min via INTRAVENOUS
  Filled 2013-11-08 (×12): qty 4

## 2013-11-08 MED ORDER — VANCOMYCIN HCL IN DEXTROSE 750-5 MG/150ML-% IV SOLN
750.0000 mg | INTRAVENOUS | Status: DC
  Administered 2013-11-08: 750 mg via INTRAVENOUS
  Filled 2013-11-08 (×2): qty 150

## 2013-11-08 MED ORDER — MIDAZOLAM HCL 2 MG/2ML IJ SOLN
1.0000 mg | INTRAMUSCULAR | Status: DC | PRN
  Administered 2013-11-08 – 2013-11-16 (×6): 1 mg via INTRAVENOUS
  Filled 2013-11-08 (×8): qty 2

## 2013-11-08 MED FILL — Sodium Chloride IV Soln 0.9%: INTRAVENOUS | Qty: 1000 | Status: AC

## 2013-11-08 NOTE — Progress Notes (Signed)
eLink Physician-Brief Progress Note Patient Name: Shawn Mathews DOB: 24-Apr-1927 MRN: [id number]   Date of Service  11/08/2013  HPI/Events of Note  Shock Increasing vasopressor requirements Progressive acidosis  eICU Interventions  Increase RR 20 -->30 ABG in 1 hour     Intervention Category Major Interventions: Shock - evaluation and management;Other:;Acid-Base disturbance - evaluation and management  Shawn Mathews 11/08/2013, 6:27 PM

## 2013-11-08 NOTE — Progress Notes (Signed)
Patient ID: Shawn Mathews, male   DOB: 1927-07-19, 80 y.o.   MRN: [id number]  I just discussed the situation with the patient's sister, a daughter by phone, and a brother-in-law who is a physician.  I explained our concerns and his condition in detail. Even if I took him to the OR, I do not think he would even tolerate a negative laparotomy.  Given his current condition, we will continue the current supportive care.

## 2013-11-08 NOTE — Progress Notes (Signed)
2 Days Post-Op  Subjective: Remains on pressors. Urine output has improved. Acidosis correcting  Objective: Vital signs in last 24 hours: Temp:  [99.5 F (37.5 C)-100.6 F (38.1 C)] 99.7 F (37.6 C) (09/10 0600) Pulse Rate:  [95-143] 107 (09/10 0600) Resp:  [14-27] 20 (09/10 0600) BP: (90-114)/(54-88) 94/66 mmHg (09/10 0600) SpO2:  [84 %-100 %] 96 % (09/10 0600) Arterial Line BP: (88-129)/(48-64) 89/49 mmHg (09/10 0600) FiO2 (%):  [40 %-60 %] 50 % (09/10 0430) Last BM Date: 11/05/2013  Intake/Output from previous day: 09/09 0701 - 09/10 0700 In: 5463.2 [I.V.:3971.6; Blood:191.7; IV Piggyback:1300] Out: 1920 [Urine:945; Emesis/NG output:975] Intake/Output this shift:   Abdomen flat but firm, guarding throughout, greatest on the right  Lab Results:   Recent Labs  11/08/13 11/08/13 0345  WBC 17.9* 16.5*  HGB 10.2* 9.7*  HCT 29.9* 29.0*  PLT 70* 74*   BMET  Recent Labs  11/07/13 2346 11/08/13 0345  NA 139 140  K 4.7 4.6  CL 106 107  CO2 18* 17*  GLUCOSE 114* 130*  BUN 29* 30*  CREATININE 2.73* 2.74*  CALCIUM 7.5* 7.0*   PT/INR  Recent Labs  11/07/13 1100 11/08/13 0345  LABPROT 19.5* 23.4*  INR 1.65* 2.08*   ABG  Recent Labs  11/07/13 1746 11/08/13 0353  PHART 7.359 7.368  HCO3 19.9* 17.0*    Studies/Results: Ct Abdomen Pelvis Wo Contrast  11/07/2013   CLINICAL DATA:  Status post aortic endograft placement on 2013/11/17. Clinical concern for bowel ischemia.  EXAM: CT ABDOMEN AND PELVIS WITHOUT CONTRAST  TECHNIQUE: Multidetector CT imaging of the abdomen and pelvis was performed following the standard protocol without IV contrast.  COMPARISON:  10/18/2013.  FINDINGS: Lung Bases: Emphysema with bilateral lower lobe collapse/ consolidation, left greater than right. Contrast material in the lower esophagus may be related to reflux or dysmotility.  Liver:  Normal uninfused appearance.  Spleen: Normal on infused features.  Stomach: Distended. NG tube tip is  in the fundus of the stomach. No gastric wall thickening.  Pancreas: No mass lesion is evident. No dilatation of the main duct.  Gallbladder/Biliary: High attenuation material in the lumen compatible with vicarious excretion of contrast. No intra or extrahepatic biliary duct dilatation.  Kidneys/Adrenals: No adrenal nodule or mass. Residual contrast material is identified in the cortices of both kidneys in a heterogeneous configuration.  Bowel Loops: Duodenum is normally positioned as is the ligament of Treitz. No evidence for small bowel dilatation. The mid and distal small bowel is opacified in this along with lack of intravenous contrast limits assessment for bowel wall thickening. No gross perienteric edema is evident. There is no evidence for small bowel pneumatosis. There is some pericecal edema/inflammation although no substantial cecal wall thickening is evident. No evidence for colonic pneumatosis. Diverticular changes are noted in the sigmoid colon.  Nodes: No evidence for lymphadenopathy to in the abdomen or pelvis  Vasculature: Aortic endo graft noted within large abdominal aortic aneurysm measuring up to 8.6 cm in diameter. Gas within the lumen of the aorta and external to the stent graft is most likely related to the recent stent placement.  Pelvic Genitourinary: Streak artifact through the lower pelvis from the patient's bilateral hip replacements obscures lower pelvic anatomy.  Bones/Musculoskeletal: Bilateral hip replacement noted. No worrisome lytic or sclerotic osseous abnormality  Body Wall: No evidence for abdominal wall hernia. There is body wall edema in the lower abdomen and pelvis.  Other: There may be a trace amount of free fluid  in the pelvis.  IMPRESSION: No definite CT evidence for bowel ischemia. Study is limited by incomplete opacification of small and large bowel and lack of intravenous contrast material, but no pneumatosis is evident. There is a small amount of edema or inflammation  around the cecum, but no associated cecal wall thickening or pneumatosis is evident.   Electronically Signed   By: Kennith Center M.D.   On: 11/07/2013 14:40   Dg Chest Port 1 View  11/07/2013   CLINICAL DATA:  Status post EVAR and aortic valvuloplasty.  EXAM: PORTABLE CHEST - 1 VIEW  COMPARISON:  11/07/2013  FINDINGS: Endotracheal tube remains with the tip approximately 3.7 cm above the carina. Swan-Ganz catheter continues to show coiled configuration in the main pulmonary artery. Additional jugular central line tip is in the SVC. Relatively stable bilateral lower lobe atelectasis present, left greater than right. Probable small left pleural effusion remains. No pulmonary edema, pneumothorax or pneumomediastinum. Cardiac and mediastinal contours are stable.  IMPRESSION: No pneumothorax. Stable bilateral lower lobe atelectasis. Stable probable small left pleural effusion.   Electronically Signed   By: Irish Lack M.D.   On: 11/07/2013 08:21   Dg Chest Portable 1 View  11/28/2013   CLINICAL DATA:  Postop for stent graft placement.  EXAM: PORTABLE CHEST - 1 VIEW  COMPARISON:  10/04/2013  FINDINGS: Endotracheal tube is 3.5 cm above the carina. There are two right jugular central lines. One central line is in the SVC region. The other is a Swan-Ganz catheter which appears to be coiled in the main pulmonary outflow tract region. Few patchy densities at the left lung base are most compatible with atelectasis. No evidence for a pneumothorax.  IMPRESSION: Left basilar atelectasis.  Negative for a pneumothorax.  Swan-Ganz catheter appears to be coiled in the main pulmonary artery region.   Electronically Signed   By: Richarda Overlie M.D.   On: 11/13/2013 15:58   Dg Abd Portable 1v  11/07/2013   CLINICAL DATA:  80 year old male with abdominal pain greater on the right side. Initial encounter.  EXAM: PORTABLE ABDOMEN - 1 VIEW  COMPARISON:  CTA abdomen and pelvis 10/03/2013. Portable chest radiograph from 0641 hr today.   FINDINGS: Portable AP supine view at 0936 hr.  New bifurcated abdominal aortic endograft. Large rim calcified native abdominal aortic aneurysm sac.  Non obstructed bowel gas pattern. Enteric tube looped in the left upper quadrant at the level of the stomach. Partially visible Swan-Ganz catheter. Bilateral hip arthroplasty hardware partially visible. Increased left lung base opacity, as seen earlier today.  IMPRESSION: 1. Non obstructed bowel gas pattern. Enteric tube looped in the region of the stomach. 2. Left lung base consolidation or collapse. 3. New abdominal aortic endograft since 10/08/2013, large rim calcified native aneurysm sac.   Electronically Signed   By: Augusto Gamble M.D.   On: 11/07/2013 09:55    Anti-infectives: Anti-infectives   Start     Dose/Rate Route Frequency Ordered Stop   11/07/13 1215  vancomycin (VANCOCIN) 250 mg in sodium chloride 0.9 % 100 mL IVPB     250 mg 100 mL/hr over 60 Minutes Intravenous  Once 11/07/13 1209 11/07/13 1559   11/07/13 1200  piperacillin-tazobactam (ZOSYN) IVPB 3.375 g     3.375 g 12.5 mL/hr over 240 Minutes Intravenous 3 times per day 11/07/13 1154     11/07/13 1000  vancomycin (VANCOCIN) 500 mg in sodium chloride 0.9 % 100 mL IVPB  Status:  Discontinued     500  mg 100 mL/hr over 60 Minutes Intravenous Every 24 hours 11/07/13 0905 11/07/13 1208   11/10/2013 0615  levofloxacin (LEVAQUIN) IVPB 500 mg     500 mg 100 mL/hr over 60 Minutes Intravenous To Surgery 11/13/2013 0605 11/15/2013 0820   11/05/2013 0600  vancomycin (VANCOCIN) IVPB 1000 mg/200 mL premix  Status:  Discontinued     1,000 mg 200 mL/hr over 60 Minutes Intravenous On call to O.R. 11/05/13 0745 11/13/2013 1450   11/27/2013 0600  vancomycin (VANCOCIN) IVPB 1000 mg/200 mL premix  Status:  Discontinued     1,000 mg 200 mL/hr over 60 Minutes Intravenous To Surgery 10/30/13 0730 11/01/13 1740      Assessment/Plan: s/p Procedure(s) with comments: ABDOMINAL AORTIC ENDOVASCULAR STENT GRAFT REPAIR   (Bilateral) BALLOON AORTIC VALVE VALVULOPLASTY (N/A) - Cooper will start first ENDARTERECTOMY FEMORAL WITH PATCH ANGIOPLASTY (Right)  Difficult situation.  I suspect he has had an ischemic episode to the bowel, but given his slight overall stability, stable lactate level, improved UOP, the decision to explore his abdomin is not totally clear.  I don't think he would tolerate insufflation for a diagnostic laparoscopy so he would need a formal laparotomy.  Discussed with critical care and vascular surgery.  Will continue supportive care and serial abdominal exams for now.  LOS: 10 days    Harace Mccluney A 11/08/2013

## 2013-11-08 NOTE — Progress Notes (Signed)
     SUBJECTIVE: Pt is awake but intubated.   BP 94/66  Pulse 107  Temp(Src) 99.7 F (37.6 C) (Core (Comment))  Resp 20  Ht  (1.778 m)  Wt 170 lb 6.7 oz (77.3 kg)  BMI 24.45 kg/m2  SpO2 96%  Intake/Output Summary (Last 24 hours) at 11/08/13 0738 Last data filed at 11/08/13 0700  Gross per 24 hour  Intake 5463.22 ml  Output   1920 ml  Net 3543.22 ml    PHYSICAL EXAM General: Intubated, sedated.   Neck: No JVD. No masses noted.  Lungs: Mechanical breath sounds bilaterally. No wheezes or rhonci noted.  Heart: Tachy with slight systolic murmur noted.  Abdomen:  No bowel sounds noted, tender, firm.  Extremities: No lower extremity edema.   LABS: Basic Metabolic Panel:  Recent Labs  16/10/96 1735 11/07/13 2038 11/07/13 2346 11/08/13 0345  NA 139  --  139 140  K 4.5  --  4.7 4.6  CL 106  --  106 107  CO2 18*  --  18* 17*  GLUCOSE 107*  --  114* 130*  BUN 27*  --  29* 30*  CREATININE 2.49*  --  2.73* 2.74*  CALCIUM 7.5*  --  7.5* 7.0*  MG  --  1.7  --   --    CBC:  Recent Labs  11/08/13 11/08/13 0345  WBC 17.9* 16.5*  NEUTROABS 16.1* PENDING  HGB 10.2* 9.7*  HCT 29.9* 29.0*  MCV 94.0 91.8  PLT 70* 74*   Cardiac Enzymes:  Recent Labs  2013-11-23 1614 2013-11-23 2315 11/07/13 0400  TROPONINI 2.29* 3.94* 5.91*   Current Meds: . sodium chloride   Intravenous Once  . antiseptic oral rinse  7 mL Mouth Rinse QID  . chlorhexidine  15 mL Mouth Rinse BID  . dorzolamide  1 drop Both Eyes BID  . feeding supplement (ENSURE COMPLETE)  237 mL Oral BID BM  . fentaNYL  50 mcg Intravenous Once  . insulin aspart  0-9 Units Subcutaneous 6 times per day  . pantoprazole sodium  40 mg Per Tube Daily  . piperacillin-tazobactam (ZOSYN)  IV  3.375 g Intravenous 3 times per day  . sodium chloride  500 mL Intravenous Once    ASSESSMENT AND PLAN:  1. Critical aortic valve stenosis: s/p balloon aortic valvuloplasty per Dr. [name] 2013/11/23. Pt developed tachycardia,  hypotension the evening following his procedure. Echo 11/07/13 with normal LV function, small non-clinically significant effusion, no signficant AI. His hemodynamic instability is not felt to be cardiac related.   2. AAA: s/p EVAR 2013/11/23. Vascular surgery following.   3. Shock: Pt remains on pressors this am. He has persistent sinus tachycardia with episode of SVT overnight. Possible acute abdominal process. Surgery is following but no plans for surgical exploration at this time. PCCM also following. Elevated troponin felt to be due to demand ischemia. Pt with minimal CAD by cath last week. LV function has been normal.  Continue supportive care.   4. SVT: Unable to start beta blockers or Calcium channel blockers due to his ongoing hypotension. Will follow.    Shawn Mathews  9/10/20157:38 AM

## 2013-11-08 NOTE — Progress Notes (Signed)
PULMONARY / CRITICAL CARE MEDICINE   Name: Shawn Mathews MRN: [id number] DOB: 06/01/1927    ADMISSION DATE:  10/05/2013 CONSULTATION DATE:  9/8  REFERRING MD :  Imogene Burn  CHIEF COMPLAINT:  Post op vent management  INITIAL PRESENTATION: 80 y/o male admitted on 9/8 with abdominal pain felt to be due to AAA, found to have critical symptomatic aortic stenosis.  Underwent endovascular AAA repair on 9/8 with valvuloplasty so PCCM consulted for post op vent management.  STUDIES:  8/31 CT angio AB > AAA 8.5 cm, tortuos R iliac arterial system, occluded SFA, 17 mm renal mass left 9/4 CT angio heart/chest> emphysema, calcified mitral and aortic valves, calcified trileaflet aortic valve  SIGNIFICANT EVENTS: 9/8 Aortic valvuloplasty, bilat common fem artery cannulation, R iliofemoral endarterectomy, repair left common femoral artery, aortogram, repair of aorta with bifurcated prosthesis, aortic valvuloplasty 9/9- worsening shock, tachy   SUBJECTIVE:  Pressors changed to neo gt, lower levo gtt Tachy improved NG aspirate bloody Borderline UO Low grade fever  VITAL SIGNS: Temp:  [99.5 F (37.5 C)-100.6 F (38.1 C)] 99.7 F (37.6 C) (09/10 0600) Pulse Rate:  [95-143] 107 (09/10 0600) Resp:  [14-27] 20 (09/10 0600) BP: (90-114)/(54-88) 94/66 mmHg (09/10 0600) SpO2:  [84 %-100 %] 96 % (09/10 0600) Arterial Line BP: (88-129)/(48-64) 89/49 mmHg (09/10 0600) FiO2 (%):  [40 %-60 %] 50 % (09/10 0430) HEMODYNAMICS: PAP: (30-49)/(14-32) 34/19 mmHg CVP:  [6 mmHg-14 mmHg] 6 mmHg VENTILATOR SETTINGS: Vent Mode:  [-] PRVC FiO2 (%):  [40 %-60 %] 50 % Set Rate:  [20 bmp] 20 bmp Vt Set:  [580 mL] 580 mL PEEP:  [5 cmH20] 5 cmH20 Plateau Pressure:  [16 cmH20-17 cmH20] 17 cmH20 INTAKE / OUTPUT:  Intake/Output Summary (Last 24 hours) at 11/08/13 0819 Last data filed at 11/08/13 0700  Gross per 24 hour  Intake 5331.99 ml  Output   1880 ml  Net 3451.99 ml    PHYSICAL EXAMINATION: General:  Sedated on  vent rass 0 HEENT: NCAT, PERRL, ETT in place PULM: coarse CV: RRR, distant heart sounds, apex murmur AB: firm, no BS, no rebound, guarding voluntary Ext: cool, minimal edema, palpable rt, doplerable left dp Neuro: sedated on vent light, moves all ext  LABS:  CBC  Recent Labs Lab 11/07/13 0815 11/08/13 11/08/13 0345  WBC 19.6* 17.9* 16.5*  HGB 10.4* 10.2* 9.7*  HCT 30.4* 29.9* 29.0*  PLT 71* 70* 74*   Coag's  Recent Labs Lab 11/07/13 0856 11/07/13 1100 11/08/13 0345  APTT 42* 40* 54*  INR 1.64* 1.65* 2.08*   BMET  Recent Labs Lab 11/07/13 1735 11/07/13 2346 11/08/13 0345  NA 139 139 140  K 4.5 4.7 4.6  CL 106 106 107  CO2 18* 18* 17*  BUN 27* 29* 30*  CREATININE 2.49* 2.73* 2.74*  GLUCOSE 107* 114* 130*   Electrolytes  Recent Labs Lab 11/07/13 1735 11/07/13 2038 11/07/13 2346 11/08/13 0345  CALCIUM 7.5*  --  7.5* 7.0*  MG  --  1.7  --   --    Sepsis Markers  Recent Labs Lab 11/07/13 1100 11/07/13 1720 11/08/13  LATICACIDVEN 3.0* 2.9* 2.6*   ABG  Recent Labs Lab 11/07/13 1101 11/07/13 1746 11/08/13 0353  PHART 7.300* 7.359 7.368  PCO2ART 42.1 35.3 29.8*  PO2ART 63.0* 56.0* 70.0*   Liver Enzymes  Recent Labs Lab 11/08/13 0345  AST 111*  ALT 59*  ALKPHOS 44  BILITOT 1.2  ALBUMIN 2.3*   Cardiac Enzymes  Recent Labs Lab  11/23/2013 1614 11/12/2013 2315 11/07/13 0400  TROPONINI 2.29* 3.94* 5.91*   Glucose  Recent Labs Lab 11/07/13 0748 11/07/13 1207 11/07/13 1646 11/07/13 1934 11/07/13 2338 11/08/13 0350  GLUCAP 96 90 113* 116* 109* 128*    Imaging Ct Abdomen Pelvis Wo Contrast  11/07/2013   CLINICAL DATA:  Status post aortic endograft placement on 11/28/2013. Clinical concern for bowel ischemia.  EXAM: CT ABDOMEN AND PELVIS WITHOUT CONTRAST  TECHNIQUE: Multidetector CT imaging of the abdomen and pelvis was performed following the standard protocol without IV contrast.  COMPARISON:  10/02/2013.  FINDINGS: Lung Bases:  Emphysema with bilateral lower lobe collapse/ consolidation, left greater than right. Contrast material in the lower esophagus may be related to reflux or dysmotility.  Liver:  Normal uninfused appearance.  Spleen: Normal on infused features.  Stomach: Distended. NG tube tip is in the fundus of the stomach. No gastric wall thickening.  Pancreas: No mass lesion is evident. No dilatation of the main duct.  Gallbladder/Biliary: High attenuation material in the lumen compatible with vicarious excretion of contrast. No intra or extrahepatic biliary duct dilatation.  Kidneys/Adrenals: No adrenal nodule or mass. Residual contrast material is identified in the cortices of both kidneys in a heterogeneous configuration.  Bowel Loops: Duodenum is normally positioned as is the ligament of Treitz. No evidence for small bowel dilatation. The mid and distal small bowel is opacified in this along with lack of intravenous contrast limits assessment for bowel wall thickening. No gross perienteric edema is evident. There is no evidence for small bowel pneumatosis. There is some pericecal edema/inflammation although no substantial cecal wall thickening is evident. No evidence for colonic pneumatosis. Diverticular changes are noted in the sigmoid colon.  Nodes: No evidence for lymphadenopathy to in the abdomen or pelvis  Vasculature: Aortic endo graft noted within large abdominal aortic aneurysm measuring up to 8.6 cm in diameter. Gas within the lumen of the aorta and external to the stent graft is most likely related to the recent stent placement.  Pelvic Genitourinary: Streak artifact through the lower pelvis from the patient's bilateral hip replacements obscures lower pelvic anatomy.  Bones/Musculoskeletal: Bilateral hip replacement noted. No worrisome lytic or sclerotic osseous abnormality  Body Wall: No evidence for abdominal wall hernia. There is body wall edema in the lower abdomen and pelvis.  Other: There may be a trace amount  of free fluid in the pelvis.  IMPRESSION: No definite CT evidence for bowel ischemia. Study is limited by incomplete opacification of small and large bowel and lack of intravenous contrast material, but no pneumatosis is evident. There is a small amount of edema or inflammation around the cecum, but no associated cecal wall thickening or pneumatosis is evident.   Electronically Signed   By: Kennith Center M.D.   On: 11/07/2013 14:40   Dg Chest Port 1 View  11/07/2013   CLINICAL DATA:  Status post EVAR and aortic valvuloplasty.  EXAM: PORTABLE CHEST - 1 VIEW  COMPARISON:  10/31/2013  FINDINGS: Endotracheal tube remains with the tip approximately 3.7 cm above the carina. Swan-Ganz catheter continues to show coiled configuration in the main pulmonary artery. Additional jugular central line tip is in the SVC. Relatively stable bilateral lower lobe atelectasis present, left greater than right. Probable small left pleural effusion remains. No pulmonary edema, pneumothorax or pneumomediastinum. Cardiac and mediastinal contours are stable.  IMPRESSION: No pneumothorax. Stable bilateral lower lobe atelectasis. Stable probable small left pleural effusion.   Electronically Signed   By: Irish Lack  M.D.   On: 11/07/2013 08:21   Dg Abd Portable 1v  11/07/2013   CLINICAL DATA:  80 year old male with abdominal pain greater on the right side. Initial encounter.  EXAM: PORTABLE ABDOMEN - 1 VIEW  COMPARISON:  CTA abdomen and pelvis 10/19/2013. Portable chest radiograph from 0641 hr today.  FINDINGS: Portable AP supine view at 0936 hr.  New bifurcated abdominal aortic endograft. Large rim calcified native abdominal aortic aneurysm sac.  Non obstructed bowel gas pattern. Enteric tube looped in the left upper quadrant at the level of the stomach. Partially visible Swan-Ganz catheter. Bilateral hip arthroplasty hardware partially visible. Increased left lung base opacity, as seen earlier today.  IMPRESSION: 1. Non obstructed bowel  gas pattern. Enteric tube looped in the region of the stomach. 2. Left lung base consolidation or collapse. 3. New abdominal aortic endograft since 10/27/2013, large rim calcified native aneurysm sac.   Electronically Signed   By: Augusto Gamble M.D.   On: 11/07/2013 09:55     ASSESSMENT / PLAN:  PULMONARY OETT 9/8 >> A: COPD Acute resp failure P:   -full vent support, keep same MV  -no weaning until off pressors -duoneb scheduled   CARDIOVASCULAR CVL R IJ introducer/RHC A: Shock stabilising, r/o SIRS abdo ischemia Echo - nml LV fn P:  - avoid vaso until bowel ischemia -neo gtt to continue & taper off levophed -Bolus iVFs to obtain PA diastolic 25 range  RENAL A:  Acute on chronic renal fialure, cr plateau-ing P:   -monitor urine output -bmet q12  GASTROINTESTINAL A: Watershed bowel  ischemia s/p AAA repair at risk IMA P:   -OG tube -NPO -Surgery evaluating, agree that high surgical risk for laparatomy  HEMATOLOGIC A:  Minor bleeding from groin site in OR resolved, now s/p PRBC Thrombocytopenia consumptive likley Coagulapthy P:  -Transfusion goal < 7gm/dL  -Correct coags if bleeding worsens   INFECTIOUS A:  R/o  Bowel ischemia, r/o abdo sepsis P:   BC 9/9>>> Added  empiric abdo coverage, stop if improves 9/9 zosyn>>> 9/9 vanc>>>   ENDOCRINE A:  Mild hyperglycemia, now off insulin gtt but no history of DM2 -cortisol high, tsh ok P:   -monitor CBG   NEUROLOGIC A:  Pain control post surgery Sedation for vent synchrony required P:   RASS goal: 0 Fentanyl prn Avoid benzo if able  TODAY'S SUMMARY: Stable shock, concern for ischemic bowel  I have personally obtained a history, examined the patient, evaluated laboratory and imaging results, formulated the assessment and plan and placed orders. CRITICAL CARE: The patient is critically ill with multiple organ systems failure and requires high complexity decision making for assessment and support, frequent  evaluation and titration of therapies, application of advanced monitoring technologies and extensive interpretation of multiple databases. Critical Care Time devoted to patient care services described in this note is 40 minutes.    Cyril Mourning MD. [name]. [facility] Pager [phone] If no response call [phone]

## 2013-11-08 NOTE — Progress Notes (Signed)
eLink Physician-Brief Progress Note Patient Name: Shawn Mathews DOB: 12-25-1927 MRN: [id number]   Date of Service  11/08/2013  HPI/Events of Note  Sinus tach, shock unchanged but not worse Lactate down slightly, h/h stable  eICU Interventions  Change from levophed to neo given sinus tach     Intervention Category Major Interventions: Hypotension - evaluation and management;Shock - evaluation and management  MCQUAID, DOUGLAS 11/08/2013, 1:25 AM

## 2013-11-08 NOTE — Progress Notes (Addendum)
   Daily Progress Note  Abd exam improved from AM.  No guarding at this point, continued RUQ and RLQ < L TTP.  Vasopressor profile stable.  UOP unchanged.  Cr continues to climb though UOP maintaining at ~30 cc/hr.    - at this point, I agree with Gen Surgery to hold the course and continue with serial exams  [name], MD [facility] Office: [phone] Pager: [phone]  11/08/2013, 1:03 PM

## 2013-11-08 NOTE — Progress Notes (Signed)
Called Dr. Kendrick Fries about CVP 6.  Updated on neo now at and levo at .  Orders received for 500cc NS bolus.  Will continue to monitor.  Roselie Awkward, RN

## 2013-11-08 NOTE — Progress Notes (Signed)
Dr. Kendrick Fries notified of HR low 140s, levo now up to .  Orders received, will continue to monitor.  Roselie Awkward, RN

## 2013-11-08 NOTE — Progress Notes (Signed)
ANTIBIOTIC CONSULT NOTE - INITIAL  Pharmacy Consult for Vancomycin and Zosyn Indication: intra-abdominal infection  Allergies  Allergen Reactions  . Penicillins     unknown    Patient Measurements: Height:  (177.8 cm) Weight: 170 lb 6.7 oz (77.3 kg) IBW/kg (Calculated) : 73  Vital Signs: Temp: 99.3 F (37.4 C) (09/10 1500) Temp src: Core (Comment) (09/10 1500) BP: 96/64 mmHg (09/10 1500) Pulse Rate: 98 (09/10 1500) Intake/Output from previous day: 09/09 0701 - 09/10 0700 In: 5463.2 [I.V.:3971.6; Blood:191.7; IV Piggyback:1300] Out: 1920 [Urine:945; Emesis/NG output:975] Intake/Output from this shift: Total I/O In: 2292.1 [I.V.:1432.1; NG/GT:60; IV Piggyback:800] Out: 282 [Urine:232; Emesis/NG output:50]  Labs:  Recent Labs  11/07/13 0815  11/07/13 2346 11/08/13 11/08/13 0345 11/08/13 1100  WBC 19.6*  --   --  17.9* 16.5*  --   HGB 10.4*  --   --  10.2* 9.7*  --   PLT 71*  --   --  70* 74*  --   CREATININE  --   < > 2.73*  --  2.74* 3.01*  < > = values in this interval not displayed. Estimated Creatinine Clearance: 18.9 ml/min (by C-G formula based on Cr of 3.01).  Medical History: Past Medical History  Diagnosis Date  . Hypertension   . Gout   . Glaucoma   . COPD (chronic obstructive pulmonary disease)    Assessment: 86yom admitted on 9/8 with abdominal pain felt to be due to AAA, found to have critical symptomatic aortic stenosis. Underwent endovascular AAA repair on 9/8 with valvuloplasty. He developed worsening shock, and started empiric vancomycin and zosyn for possible abdominal infection. No laparotomy at this point, d/t high risk. He is also now in acute renal failure with sCr 1.27 >> 3, no cultures.   Received 1g of vancomycin pre-op on 9/8 at 0518 and total of  9/9. Noted PCN allergy with reaction 'unknown'.  Random vancomycin level checked at noon = 8.3  Goal of Therapy:  Vancomycin trough level 15-20 mcg/ml  Plan:  - Change zosyn  to 2.25 g IV Q 8 - Vancomycin 750 mg IV Q 24 hrs for now - Will watch renal function change closely   Bayard Hugger, PharmD, BCPS  Clinical Pharmacist  Pager: [phone]   11/08/2013,3:49 PM

## 2013-11-08 NOTE — Progress Notes (Addendum)
AAA Progress Note    11/08/2013 8:18 AM 2 Days Post-Op  Subjective:  Intubated--opens eyes to name, but does not follow commands  Tm 100.6 now 99.7 HR 100's-140's regular 90's-100's systolic 96% .FiO2  Gtts: Fentanyl Neo Levophed  ABx: Zosyn Vanc  Filed Vitals:   11/08/13 0600  BP: 94/66  Pulse: 107  Temp: 99.7 F (37.6 C)  Resp: 20    Physical Exam: Cardiac:  regular Lungs:  Intubated; CTAB Abdomen:  Tender to palpation (grimaces upon palpation); decreased BS Incisions:  Bilateral groin incisions are covered with clean bandage. Extremities:  Right 1+ DP; left foot is cooler without palpable pedal pulses Neuro:  Opens eyes to voice, but otherwise, does not follow commands  CBC    Component Value Date/Time   WBC 16.5* 11/08/2013 0345   RBC 3.16* 11/08/2013 0345   HGB 9.7* 11/08/2013 0345   HCT 29.0* 11/08/2013 0345   PLT 74* 11/08/2013 0345   MCV 91.8 11/08/2013 0345   MCH 30.7 11/08/2013 0345   MCHC 33.4 11/08/2013 0345   RDW 16.2* 11/08/2013 0345   LYMPHSABS PENDING 11/08/2013 0345   MONOABS PENDING 11/08/2013 0345   EOSABS PENDING 11/08/2013 0345   BASOSABS PENDING 11/08/2013 0345    BMET    Component Value Date/Time   NA 140 11/08/2013 0345   K 4.6 11/08/2013 0345   CL 107 11/08/2013 0345   CO2 17* 11/08/2013 0345   GLUCOSE 130* 11/08/2013 0345   BUN 30* 11/08/2013 0345   CREATININE 2.74* 11/08/2013 0345   CALCIUM 7.0* 11/08/2013 0345   GFRNONAA 20* 11/08/2013 0345   GFRAA 23* 11/08/2013 0345    INR    Component Value Date/Time   INR 2.08* 11/08/2013 0345     Intake/Output Summary (Last 24 hours) at 11/08/13 0818 Last data filed at 11/08/13 0700  Gross per 24 hour  Intake 5331.99 ml  Output   1880 ml  Net 3451.99 ml     Assessment/Plan:  80 y.o. male is s/p  1. Bilateral common femoral artery cannulation under ultrasound guidance 2. Right iliofemoral endarterectomy with bovine patch angioplasty 3. "Preclose" repair left common femoral artery    4. Placement of catheter in aorta x 2 5. Aortogram 6. Repair of aorta with modular bifurcated prosthesis with one limb (31 mm x 14 mm x 17 cm) 7. Placement of Left iliac limb (14 mm x 12 cm) 8. Radiology S&I 9. Aortic valvuloplasty (done by Dr. [name]) 2 Days Post-Op   -pt with possible ischemic episode to bowel-general surgery following.  Given that his lactate level is stable, he has improved UOP, will continue with supportive care at this time with serial abdominal exams for now.  Will not tolerate insufflation from laparoscopy so he would need laparotomy. -requiring pressor support.  Echo done on 11/07/13 revealed normal LV fxn, small non-clinically significant effusion, no significant AI and his hemodynamic instability is not felt to be cardiac related.  Elevated troponins are felt to be due to demand ischemia as pt had minimal CAD last week by cath.   -continue vent management per CCM -acute on chronic renal failure--Cr has trended upward past 24 hrs, but has been stable for the past 2 blood draws at 2.74 -WBC trending downward-pt is on vanc/zosyn -thrombocytopenia stable (slightly improved)-received one pack of platelets 11/18/2013 -liver enzymes elevated with INR 2.08 up from 1.65 yesterday.  Hgb stable and has received 2 PRBCs on 11/02/2013 and 3 FFP's 11/07/2013 also.    Doreatha Massed, PA-C  [facility] [phone] 11/08/2013 8:18 AM  Addendum  I have independently interviewed and examined the patient, and I agree with the physician assistant's findings.  Vasopressor requirements have increased overnight but stabilized.  Abd is firm throughout, worse than yesterday.  Both feet somewhat mottled but I suspect some of this is the vasopressors.  The left foot still looks viable.  I evaluated this patient earlier with General Surgery.  We both agree that there is likely an active process in his abdomen though his lab work is not definitely for any specific process.  An ischemic  pathology usually would have more acidosis and his lactic acid decreased.  I have reviewed his completion angiography and there is no evidence of internal iliac artery occlusion or aortic rupture or endoleak.     [name], MD [facility] Office: [phone] Pager: [phone]  11/08/2013, 9:31 AM

## 2013-11-09 ENCOUNTER — Inpatient Hospital Stay (HOSPITAL_COMMUNITY): Payer: Medicare Other

## 2013-11-09 DIAGNOSIS — R6521 Severe sepsis with septic shock: Secondary | ICD-10-CM

## 2013-11-09 DIAGNOSIS — K55059 Acute (reversible) ischemia of intestine, part and extent unspecified: Secondary | ICD-10-CM

## 2013-11-09 DIAGNOSIS — A419 Sepsis, unspecified organism: Secondary | ICD-10-CM

## 2013-11-09 LAB — CBC
HCT: 24.7 % — ABNORMAL LOW (ref 39.0–52.0)
Hemoglobin: 8.4 g/dL — ABNORMAL LOW (ref 13.0–17.0)
MCH: 31.3 pg (ref 26.0–34.0)
MCHC: 34 g/dL (ref 30.0–36.0)
MCV: 92.2 fL (ref 78.0–100.0)
PLATELETS: 64 10*3/uL — AB (ref 150–400)
RBC: 2.68 MIL/uL — ABNORMAL LOW (ref 4.22–5.81)
RDW: 16.3 % — AB (ref 11.5–15.5)
WBC: 16.5 10*3/uL — ABNORMAL HIGH (ref 4.0–10.5)

## 2013-11-09 LAB — TYPE AND SCREEN
ABO/RH(D): A POS
Antibody Screen: NEGATIVE
UNIT DIVISION: 0
Unit division: 0
Unit division: 0
Unit division: 0
Unit division: 0
Unit division: 0

## 2013-11-09 LAB — FIBRINOGEN: Fibrinogen: 740 mg/dL — ABNORMAL HIGH (ref 204–475)

## 2013-11-09 LAB — GLUCOSE, CAPILLARY
GLUCOSE-CAPILLARY: 90 mg/dL (ref 70–99)
GLUCOSE-CAPILLARY: 90 mg/dL (ref 70–99)
GLUCOSE-CAPILLARY: 110 mg/dL — AB (ref 70–99)
GLUCOSE-CAPILLARY: 100 mg/dL — AB (ref 70–99)
GLUCOSE-CAPILLARY: 113 mg/dL — AB (ref 70–99)
Glucose-Capillary: 89 mg/dL (ref 70–99)

## 2013-11-09 LAB — BASIC METABOLIC PANEL
Anion gap: 19 — ABNORMAL HIGH (ref 5–15)
Anion gap: 15 (ref 5–15)
BUN: 41 mg/dL — AB (ref 6–23)
BUN: 46 mg/dL — ABNORMAL HIGH (ref 6–23)
CALCIUM: 6.7 mg/dL — AB (ref 8.4–10.5)
CO2: 13 mEq/L — ABNORMAL LOW (ref 19–32)
CO2: 17 mEq/L — ABNORMAL LOW (ref 19–32)
Calcium: 6.9 mg/dL — ABNORMAL LOW (ref 8.4–10.5)
Chloride: 106 mEq/L (ref 96–112)
Chloride: 102 mEq/L (ref 96–112)
Creatinine, Ser: 3.19 mg/dL — ABNORMAL HIGH (ref 0.50–1.35)
Creatinine, Ser: 3.35 mg/dL — ABNORMAL HIGH (ref 0.50–1.35)
GFR calc Af Amer: 19 mL/min — ABNORMAL LOW (ref >90)
GFR, EST AFRICAN AMERICAN: 18 mL/min — AB (ref >90)
GFR, EST NON AFRICAN AMERICAN: 16 mL/min — AB (ref >90)
GFR, EST NON AFRICAN AMERICAN: 16 mL/min — AB (ref >90)
Glucose, Bld: 102 mg/dL — ABNORMAL HIGH (ref 70–99)
Glucose, Bld: 113 mg/dL — ABNORMAL HIGH (ref 70–99)
Potassium: 4.2 mEq/L (ref 3.7–5.3)
Potassium: 3.9 mEq/L (ref 3.7–5.3)
SODIUM: 138 meq/L (ref 137–147)
Sodium: 134 mEq/L — ABNORMAL LOW (ref 137–147)

## 2013-11-09 LAB — POCT I-STAT 3, ART BLOOD GAS (G3+)
Acid-base deficit: 11 mmol/L — ABNORMAL HIGH (ref 0.0–2.0)
Acid-base deficit: 11 mmol/L — ABNORMAL HIGH (ref 0.0–2.0)
Bicarbonate: 14.1 mEq/L — ABNORMAL LOW (ref 20.0–24.0)
Bicarbonate: 14.4 mEq/L — ABNORMAL LOW (ref 20.0–24.0)
O2 SAT: 93 %
O2 Saturation: 91 %
PCO2 ART: 29.4 mmHg — AB (ref 35.0–45.0)
PH ART: 7.29 — AB (ref 7.350–7.450)
PO2 ART: 69 mmHg — AB (ref 80.0–100.0)
Patient temperature: 37.3
Patient temperature: 37.1
TCO2: 15 mmol/L (ref 0–100)
TCO2: 15 mmol/L (ref 0–100)
pCO2 arterial: 28.2 mmHg — ABNORMAL LOW (ref 35.0–45.0)
pH, Arterial: 7.316 — ABNORMAL LOW (ref 7.350–7.450)
pO2, Arterial: 71 mmHg — ABNORMAL LOW (ref 80.0–100.0)

## 2013-11-09 LAB — LACTIC ACID, PLASMA: Lactic Acid, Venous: 3.3 mmol/L — ABNORMAL HIGH (ref 0.5–2.2)

## 2013-11-09 LAB — VANCOMYCIN, RANDOM: Vancomycin Rm: 12.2 ug/mL

## 2013-11-09 LAB — APTT: APTT: 53 s — AB (ref 24–37)

## 2013-11-09 MED ORDER — SODIUM BICARBONATE 8.4 % IV SOLN
INTRAVENOUS | Status: DC
  Administered 2013-11-09 – 2013-11-11 (×6): via INTRAVENOUS
  Filled 2013-11-09 (×10): qty 150

## 2013-11-09 MED ORDER — VANCOMYCIN HCL IN DEXTROSE 750-5 MG/150ML-% IV SOLN
750.0000 mg | INTRAVENOUS | Status: DC
  Administered 2013-11-09: 750 mg via INTRAVENOUS
  Filled 2013-11-09 (×2): qty 150

## 2013-11-09 MED ORDER — SODIUM CHLORIDE 0.9 % IV BOLUS (SEPSIS)
500.0000 mL | Freq: Once | INTRAVENOUS | Status: AC
  Administered 2013-11-09: 500 mL via INTRAVENOUS

## 2013-11-09 NOTE — Progress Notes (Signed)
PULMONARY / CRITICAL CARE MEDICINE   Name: Shawn Mathews MRN: [id number] DOB: 02/17/1928    ADMISSION DATE:  10/20/2013 CONSULTATION DATE:  9/8  REFERRING MD :  Imogene Burn  CHIEF COMPLAINT:  Post op vent management  INITIAL PRESENTATION: 80 y/o male admitted on 9/8 with abdominal pain felt to be due to AAA, found to have critical symptomatic aortic stenosis.  Underwent endovascular AAA repair on 9/8 with valvuloplasty so PCCM consulted for post op vent management.  STUDIES:  8/31 CT angio AB > AAA 8.5 cm, tortuos R iliac arterial system, occluded SFA, 17 mm renal mass left 9/4 CT angio heart/chest> emphysema, calcified mitral and aortic valves, calcified trileaflet aortic valve  SIGNIFICANT EVENTS: 9/8 Aortic valvuloplasty, bilat common fem artery cannulation, R iliofemoral endarterectomy, repair left common femoral artery, aortogram, repair of aorta with bifurcated prosthesis, aortic valvuloplasty 9/9- worsening shock, tachy   SUBJECTIVE:  Remains on neo gtt Tachy improved decreased UO afebrile  VITAL SIGNS: Temp:  [98.4 F (36.9 C)-100 F (37.8 C)] 99 F (37.2 C) (09/11 0700) Pulse Rate:  [89-111] 90 (09/11 0700) Resp:  [20-30] 30 (09/11 0700) BP: (83-122)/(50-75) 106/64 mmHg (09/11 0700) SpO2:  [90 %-99 %] 97 % (09/11 0700) Arterial Line BP: (81-127)/(35-64) 101/59 mmHg (09/11 0700) FiO2 (%):  [50 %] 50 % (09/11 0600) HEMODYNAMICS: PAP: (34-55)/(16-33) 47/30 mmHg CVP:  [10 mmHg-11 mmHg] 11 mmHg VENTILATOR SETTINGS: Vent Mode:  [-] PRVC FiO2 (%):  [50 %] 50 % Set Rate:  [20 bmp-30 bmp] 30 bmp Vt Set:  [580 mL] 580 mL PEEP:  [5 cmH20] 5 cmH20 Plateau Pressure:  [17 cmH20-24 cmH20] 24 cmH20 INTAKE / OUTPUT:  Intake/Output Summary (Last 24 hours) at 11/09/13 0851 Last data filed at 11/09/13 0800  Gross per 24 hour  Intake 6271.56 ml  Output    641 ml  Net 5630.56 ml    PHYSICAL EXAMINATION: General:  Sedated on vent rass 0 HEENT: NCAT, PERRL, ETT in place PULM:  coarse CV: RRR, distant heart sounds, apex murmur AB: firm, RUQ tender,no BS, no rebound, guarding voluntary Ext: cool, minimal edema, palpable rt, doplerable left dp Neuro: sedated on fent gtt, moves all ext  LABS:  CBC  Recent Labs Lab 11/08/13 11/08/13 0345 11/09/13 0430  WBC 17.9* 16.5* 16.5*  HGB 10.2* 9.7* 8.4*  HCT 29.9* 29.0* 24.7*  PLT 70* 74* 64*   Coag's  Recent Labs Lab 11/07/13 0856 11/07/13 1100 11/08/13 0345 11/09/13 0800  APTT 42* 40* 54* 53*  INR 1.64* 1.65* 2.08*  --    BMET  Recent Labs Lab 11/08/13 1100 11/08/13 1650 11/09/13 0430  NA 141 140 138  K 4.7 4.4 4.2  CL 108 108 106  CO2 16* 17* 13*  BUN 34* 36* 41*  CREATININE 3.01* 3.02* 3.19*  GLUCOSE 112* 124* 102*   Electrolytes  Recent Labs Lab 11/07/13 1735 11/07/13 2038  11/08/13 1100 11/08/13 1650 11/09/13 0430  CALCIUM 7.5*  --   < > 6.9* 6.7* 6.7*  MG  --  1.7  --   --   --   --   < > = values in this interval not displayed. Sepsis Markers  Recent Labs Lab 11/07/13 1100 11/07/13 1720 11/08/13  LATICACIDVEN 3.0* 2.9* 2.6*   ABG  Recent Labs Lab 11/08/13 1822 11/08/13 2000 11/09/13 0446  PHART 7.259* 7.304* 7.290*  PCO2ART 32.9* 30.8* 29.4*  PO2ART 71.0* 72.0* 69.0*   Liver Enzymes  Recent Labs Lab 11/08/13 0345  AST 111*  ALT 59*  ALKPHOS 44  BILITOT 1.2  ALBUMIN 2.3*   Cardiac Enzymes  Recent Labs Lab 11/08/2013 1614 10/30/2013 2315 11/07/13 0400  TROPONINI 2.29* 3.94* 5.91*   Glucose  Recent Labs Lab 11/08/13 1205 11/08/13 1546 11/08/13 1946 11/09/13 0002 11/09/13 0417 11/09/13 0742  GLUCAP 105* 108* 97 89 90 90    Imaging Dg Chest Port 1 View  11/08/2013   CLINICAL DATA:  Ventilator  EXAM: PORTABLE CHEST - 1 VIEW  COMPARISON:  11/07/2013  FINDINGS: Endotracheal tube in good position. Swan-Ganz catheter tip in the main pulmonary artery. The catheter tip is coiled in the main pulmonary artery unchanged. Right jugular catheter tip in the  SVC. NG tube in the stomach.  Mild bibasilar atelectasis unchanged. Negative for edema. Negative for pneumothorax.  IMPRESSION: Support lines unchanged in position. Swan-Ganz catheter tip is coiled in the main pulmonary artery  Bibasilar atelectasis unchanged.   Electronically Signed   By: Marlan Palau M.D.   On: 11/08/2013 08:06     ASSESSMENT / PLAN:  PULMONARY OETT 9/8 >> A: COPD Acute resp failure P:   -full vent support, drop RR to 25 to avoid autoPEEP -no weaning until off pressors -duoneb scheduled -Rpt ABG   CARDIOVASCULAR CVL R IJ introducer/RHC A: Shock stabilising, r/o SIRS abdo ischemia Echo - nml LV fn P:  - avoid vaso  Since bowel ischemia -neo gtt to continue,off levophed - PA diastolic 25 = CVP 12 range, dc swan  RENAL A:  Acute on chronic renal fialure, cr rising P:   -monitor urine output -bmet q12 -dc NS, start bicarb gtt  GASTROINTESTINAL A: Watershed bowel  ischemia s/p AAA repair at risk IMA P:   -NPO -Surgery evaluating, agree that high surgical risk for laparatomy  HEMATOLOGIC A:  Minor bleeding from groin site in OR resolved, now s/p PRBC Thrombocytopenia consumptive likley Coagulopathy -likely vit K def,fibrinogen ok P:  -Transfusion goal < 7gm/dL  -Correct coags if bleeding worsens   INFECTIOUS A:  R/o  Bowel ischemia, r/o abdo sepsis P:   BC 9/9>>> ct empiric abdo coverage, stop if improves 9/9 zosyn>>> 9/9 vanc>>>   ENDOCRINE A:  Mild hyperglycemia, now off insulin gtt but no history of DM2 -cortisol high, tsh ok P:   -monitor CBG   NEUROLOGIC A:  Pain control post surgery Sedation for vent synchrony required P:   RASS goal: 0 Fentanyl gtt Versed prn ok as BP permits  TODAY'S SUMMARY: No improvement in shock, concern for ongoing ischemic bowel, Prognosis guarded, worsening renal failure  I have personally obtained a history, examined the patient, evaluated laboratory and imaging results, formulated the assessment  and plan and placed orders. CRITICAL CARE: The patient is critically ill with multiple organ systems failure and requires high complexity decision making for assessment and support, frequent evaluation and titration of therapies, application of advanced monitoring technologies and extensive interpretation of multiple databases. Critical Care Time devoted to patient care services described in this note is 40 minutes.    Cyril Mourning MD. [name]. [facility] Pager [phone] If no response call [phone]

## 2013-11-09 NOTE — Progress Notes (Signed)
3 Days Post-Op  Subjective: Intubated and sedated, remains on pressors  Objective: Vital signs in last 24 hours: Temp:  [98.4 F (36.9 C)-100 F (37.8 C)] 99 F (37.2 C) (09/11 0700) Pulse Rate:  [89-111] 90 (09/11 0700) Resp:  [20-30] 30 (09/11 0700) BP: (83-122)/(50-75) 106/64 mmHg (09/11 0700) SpO2:  [90 %-99 %] 97 % (09/11 0700) Arterial Line BP: (81-127)/(35-64) 101/59 mmHg (09/11 0700) FiO2 (%):  [50 %] 50 % (09/11 0600) Last BM Date: 11/27/2013  Intake/Output from previous day: 09/10 0701 - 09/11 0700 In: 6501 [I.V.:4801; NG/GT:150; IV Piggyback:1550] Out: 651 [Urine:551; Emesis/NG output:100] Intake/Output this shift: Total I/O In: -  Out: 20 [Urine:20]  Abdomen still with guarding greatest on the right  Lab Results:   Recent Labs  11/08/13 0345 11/09/13 0430  WBC 16.5* 16.5*  HGB 9.7* 8.4*  HCT 29.0* 24.7*  PLT 74* 64*   BMET  Recent Labs  11/08/13 1650 11/09/13 0430  NA 140 138  K 4.4 4.2  CL 108 106  CO2 17* 13*  GLUCOSE 124* 102*  BUN 36* 41*  CREATININE 3.02* 3.19*  CALCIUM 6.7* 6.7*   PT/INR  Recent Labs  11/07/13 1100 11/08/13 0345  LABPROT 19.5* 23.4*  INR 1.65* 2.08*   ABG  Recent Labs  11/08/13 2000 11/09/13 0446  PHART 7.304* 7.290*  HCO3 15.1* 14.1*    Studies/Results: Ct Abdomen Pelvis Wo Contrast  11/07/2013   CLINICAL DATA:  Status post aortic endograft placement on 11/15/2013. Clinical concern for bowel ischemia.  EXAM: CT ABDOMEN AND PELVIS WITHOUT CONTRAST  TECHNIQUE: Multidetector CT imaging of the abdomen and pelvis was performed following the standard protocol without IV contrast.  COMPARISON:  Nov 10, 2013.  FINDINGS: Lung Bases: Emphysema with bilateral lower lobe collapse/ consolidation, left greater than right. Contrast material in the lower esophagus may be related to reflux or dysmotility.  Liver:  Normal uninfused appearance.  Spleen: Normal on infused features.  Stomach: Distended. NG tube tip is in the fundus  of the stomach. No gastric wall thickening.  Pancreas: No mass lesion is evident. No dilatation of the main duct.  Gallbladder/Biliary: High attenuation material in the lumen compatible with vicarious excretion of contrast. No intra or extrahepatic biliary duct dilatation.  Kidneys/Adrenals: No adrenal nodule or mass. Residual contrast material is identified in the cortices of both kidneys in a heterogeneous configuration.  Bowel Loops: Duodenum is normally positioned as is the ligament of Treitz. No evidence for small bowel dilatation. The mid and distal small bowel is opacified in this along with lack of intravenous contrast limits assessment for bowel wall thickening. No gross perienteric edema is evident. There is no evidence for small bowel pneumatosis. There is some pericecal edema/inflammation although no substantial cecal wall thickening is evident. No evidence for colonic pneumatosis. Diverticular changes are noted in the sigmoid colon.  Nodes: No evidence for lymphadenopathy to in the abdomen or pelvis  Vasculature: Aortic endo graft noted within large abdominal aortic aneurysm measuring up to 8.6 cm in diameter. Gas within the lumen of the aorta and external to the stent graft is most likely related to the recent stent placement.  Pelvic Genitourinary: Streak artifact through the lower pelvis from the patient's bilateral hip replacements obscures lower pelvic anatomy.  Bones/Musculoskeletal: Bilateral hip replacement noted. No worrisome lytic or sclerotic osseous abnormality  Body Wall: No evidence for abdominal wall hernia. There is body wall edema in the lower abdomen and pelvis.  Other: There may be a trace amount of  free fluid in the pelvis.  IMPRESSION: No definite CT evidence for bowel ischemia. Study is limited by incomplete opacification of small and large bowel and lack of intravenous contrast material, but no pneumatosis is evident. There is a small amount of edema or inflammation around the  cecum, but no associated cecal wall thickening or pneumatosis is evident.   Electronically Signed   By: Kennith Center M.D.   On: 11/07/2013 14:40   Dg Chest Port 1 View  11/09/2013   CLINICAL DATA:  Post abdominal aortic stent graft.  EXAM: PORTABLE CHEST - 1 VIEW  COMPARISON:  11/08/2013; 11/07/2013  FINDINGS: Grossly unchanged enlarged cardiac silhouette and mediastinal contours given reduced lung volumes and patient rotation. Stable positioning of support apparatus including right jugular approach PA catheter coiled overlying the expected location of the main pulmonary outflow tract. No pneumothorax.  Lung volumes remain reduced with unchanged trace effusions and associated bibasilar opacities, left greater than right. No new focal airspace opacities. No evidence of edema. Unchanged bones.  IMPRESSION: 1. Stable positioning of support apparatus including tip of right jugular approach PA catheter coiled over the main pulmonary artery outflow tract. No pneumothorax. 2. Unchanged trace bilateral effusions and associated bibasilar opacities, left greater than right, atelectasis versus infiltrate. 3. No evidence of edema.   Electronically Signed   By: Simonne Come M.D.   On: 11/09/2013 07:44   Dg Chest Port 1 View  11/08/2013   CLINICAL DATA:  Ventilator  EXAM: PORTABLE CHEST - 1 VIEW  COMPARISON:  11/07/2013  FINDINGS: Endotracheal tube in good position. Swan-Ganz catheter tip in the main pulmonary artery. The catheter tip is coiled in the main pulmonary artery unchanged. Right jugular catheter tip in the SVC. NG tube in the stomach.  Mild bibasilar atelectasis unchanged. Negative for edema. Negative for pneumothorax.  IMPRESSION: Support lines unchanged in position. Swan-Ganz catheter tip is coiled in the main pulmonary artery  Bibasilar atelectasis unchanged.   Electronically Signed   By: Marlan Palau M.D.   On: 11/08/2013 08:06   Dg Abd Portable 1v  11/07/2013   CLINICAL DATA:  80 year old male with  abdominal pain greater on the right side. Initial encounter.  EXAM: PORTABLE ABDOMEN - 1 VIEW  COMPARISON:  CTA abdomen and pelvis 10/12/2013. Portable chest radiograph from 0641 hr today.  FINDINGS: Portable AP supine view at 0936 hr.  New bifurcated abdominal aortic endograft. Large rim calcified native abdominal aortic aneurysm sac.  Non obstructed bowel gas pattern. Enteric tube looped in the left upper quadrant at the level of the stomach. Partially visible Swan-Ganz catheter. Bilateral hip arthroplasty hardware partially visible. Increased left lung base opacity, as seen earlier today.  IMPRESSION: 1. Non obstructed bowel gas pattern. Enteric tube looped in the region of the stomach. 2. Left lung base consolidation or collapse. 3. New abdominal aortic endograft since 10/08/2013, large rim calcified native aneurysm sac.   Electronically Signed   By: Augusto Gamble M.D.   On: 11/07/2013 09:55    Anti-infectives: Anti-infectives   Start     Dose/Rate Route Frequency Ordered Stop   11/08/13 2000  piperacillin-tazobactam (ZOSYN) IVPB 2.25 g     2.25 g 100 mL/hr over 30 Minutes Intravenous 3 times per day 11/08/13 1204     11/08/13 1500  vancomycin (VANCOCIN) IVPB 750 mg/150 ml premix     750 mg 150 mL/hr over 60 Minutes Intravenous Every 24 hours 11/08/13 1449     11/07/13 1215  vancomycin (VANCOCIN) 250 mg  in sodium chloride 0.9 % 100 mL IVPB     250 mg 100 mL/hr over 60 Minutes Intravenous  Once 11/07/13 1209 11/07/13 1559   11/07/13 1200  piperacillin-tazobactam (ZOSYN) IVPB 3.375 g  Status:  Discontinued     3.375 g 12.5 mL/hr over 240 Minutes Intravenous 3 times per day 11/07/13 1154 11/08/13 1204   11/07/13 1000  vancomycin (VANCOCIN) 500 mg in sodium chloride 0.9 % 100 mL IVPB  Status:  Discontinued     500 mg 100 mL/hr over 60 Minutes Intravenous Every 24 hours 11/07/13 0905 11/07/13 1208   11/18/2013 0615  levofloxacin (LEVAQUIN) IVPB 500 mg     500 mg 100 mL/hr over 60 Minutes Intravenous  To Surgery 11/12/2013 0605 11/02/2013 0820   11/14/2013 0600  vancomycin (VANCOCIN) IVPB 1000 mg/200 mL premix  Status:  Discontinued     1,000 mg 200 mL/hr over 60 Minutes Intravenous On call to O.R. 11/05/13 0745 11/27/2013 1450   11-30-2013 0600  vancomycin (VANCOCIN) IVPB 1000 mg/200 mL premix  Status:  Discontinued     1,000 mg 200 mL/hr over 60 Minutes Intravenous To Surgery 10/30/13 0730 11/01/13 1740      Assessment/Plan: s/p Procedure(s) with comments: ABDOMINAL AORTIC ENDOVASCULAR STENT GRAFT REPAIR  (Bilateral) BALLOON AORTIC VALVE VALVULOPLASTY (N/A) - Cooper will start first ENDARTERECTOMY FEMORAL WITH PATCH ANGIOPLASTY (Right)  Probable ischemic bowel He is slightly more acidotic with increased base deficit.  coags currently pending.  I'm still worried that process will not correct and he still may need an exploratory laparotomy (which I don't think he could tolerate regardless)  LOS: 11 days    Kayani Rapaport A 11/09/2013

## 2013-11-09 NOTE — Progress Notes (Signed)
NUTRITION FOLLOW UP  DOCUMENTATION CODES Per approved criteria  -Severe malnutrition in the context of acute illness or injury   INTERVENTION:  Recommend TPN initiation  RD to follow for nutrition care plan  NUTRITION DIAGNOSIS: Malnutrition related to inadequate oral intake as evidenced by moderate depletion of muscle mass and 7% weight loss in 1 month, ongoing  Goal: Pt to meet >/= 90% of their estimated nutrition needs, currently unmet  Monitor:  Nutrition support initiation, respiratory status, weight, labs, I/O's  ASSESSMENT: 80 yo patient sent to hospital by Dr. Shana Chute for murmur and palpable abdominal mass. Patient found to have 8cm AAA.   9/1:  Patient reports that he has lost weight over the past month due to poor appetite with abdominal pain. Since admission, he has been eating 100% of meals.  Patient transferred to 2S-SICU from 2W-Cardiac post-op.  Patient s/p procedure 9/8: INTRODUCTION OF LEFT CONTRALATERAL LIMB OF EVAR LEFT COMMON FEMORAL ARTERY  Patient is currently intubated on ventilator support -- OGT in place MV: 14.3 L/min Temp (24hrs), Avg:99.3 F (37.4 C), Min:98.8 F (37.1 C), Max:100 F (37.8 C)   Surgery notes reviewed.  Probable ischemic bowel.  Pt has been intubated > 48 hours without nutrition support.    Would recommend TPN initiation at this time.  Height: Ht Readings from Last 1 Encounters:  11/17/2013  (1.778 m)    Weight: Wt Readings from Last 1 Encounters:  11/07/13 170 lb 6.7 oz (77.3 kg)    9/08  155 lb 9/07  155 lb 8/31  161 lb 8/31  151 lb  BMI:  Body mass index is 24.45 kg/(m^2).  Re-estimated Nutritional Needs: Kcal: 1850-2000 Protein: 100-110 gm Fluid: per MD  Skin: Intact  Diet Order: NPO   Intake/Output Summary (Last 24 hours) at 11/09/13 1424 Last data filed at 11/09/13 1400  Gross per 24 hour  Intake 5473.7 ml  Output    596 ml  Net 4877.7 ml    Labs:   Recent Labs Lab 11/07/13 1735  11/07/13 2038  11/08/13 1100 11/08/13 1650 11/09/13 0430  NA 139  --   < > 141 140 138  K 4.5  --   < > 4.7 4.4 4.2  CL 106  --   < > 108 108 106  CO2 18*  --   < > 16* 17* 13*  BUN 27*  --   < > 34* 36* 41*  CREATININE 2.49*  --   < > 3.01* 3.02* 3.19*  CALCIUM 7.5*  --   < > 6.9* 6.7* 6.7*  MG  --  1.7  --   --   --   --   GLUCOSE 107*  --   < > 112* 124* 102*  < > = values in this interval not displayed.  Scheduled Meds: . antiseptic oral rinse  7 mL Mouth Rinse QID  . chlorhexidine  15 mL Mouth Rinse BID  . dorzolamide  1 drop Both Eyes BID  . fentaNYL  50 mcg Intravenous Once  . insulin aspart  0-9 Units Subcutaneous 6 times per day  . pantoprazole (PROTONIX) IV  40 mg Intravenous Q24H  . piperacillin-tazobactam (ZOSYN)  IV  2.25 g Intravenous 3 times per day    Continuous Infusions: . fentaNYL infusion INTRAVENOUS 200 mcg/hr (11/09/13 0700)  . phenylephrine (NEO-SYNEPHRINE) Adult infusion 100 mcg/min (11/09/13 1400)  .  sodium bicarbonate  infusion 1000 mL 100 mL/hr at 11/09/13 1000    Past Medical History  Diagnosis Date  . Hypertension   . Gout   . Glaucoma   . COPD (chronic obstructive pulmonary disease)     Past Surgical History  Procedure Laterality Date  . Total hip arthroplasty    . Lumbar disc surgery    . Abdominal aortic endovascular stent graft Bilateral 11/07/2013    Procedure: ABDOMINAL AORTIC ENDOVASCULAR STENT GRAFT REPAIR ;  Surgeon: Fransisco Hertz, MD;  Location[facility] OR;  Service: Vascular;  Laterality: Bilateral;  . Balloon aortic valve valvuloplasty N/A 11/25/2013    Procedure: BALLOON AORTIC VALVE VALVULOPLASTY;  Surgeon: [name], MD;  Location: [facility] OR;  Service: Cardiovascular;  Laterality: N/A;  [name] will start first  . Endarterectomy femoral Right 11/26/2013    Procedure: ENDARTERECTOMY FEMORAL WITH PATCH ANGIOPLASTY;  Surgeon: Fransisco Hertz, MD;  Location: [facility] OR;  Service: Vascular;  Laterality: Right;    Maureen Chatters, RD, LDN Pager  #: [phone] After-Hours Pager #: [phone]

## 2013-11-09 NOTE — Progress Notes (Signed)
ANTIBIOTIC CONSULT NOTE - INITIAL  Pharmacy Consult for Vancomycin and Zosyn Indication: intra-abdominal infection  Allergies  Allergen Reactions  . Penicillins     unknown    Patient Measurements: Height:  (177.8 cm) Weight: 170 lb 6.7 oz (77.3 kg) IBW/kg (Calculated) : 73  Vital Signs: Temp: 99 F (37.2 C) (09/11 1300) Temp src: Core (Comment) (09/11 1200) BP: 102/57 mmHg (09/11 1524) Pulse Rate: 94 (09/11 1524) Intake/Output from previous day: 09/10 0701 - 09/11 0700 In: 6511 [I.V.:4811; NG/GT:150; IV Piggyback:1550] Out: 651 [Urine:551; Emesis/NG output:100] Intake/Output from this shift: Total I/O In: 1062.9 [I.V.:982.9; NG/GT:30; IV Piggyback:50] Out: 185 [Urine:185]  Labs:  Recent Labs  11/08/13 11/08/13 0345 11/08/13 1100 11/08/13 1650 11/09/13 0430  WBC 17.9* 16.5*  --   --  16.5*  HGB 10.2* 9.7*  --   --  8.4*  PLT 70* 74*  --   --  64*  CREATININE  --  2.74* 3.01* 3.02* 3.19*   Estimated Creatinine Clearance: 17.8 ml/min (by C-G formula based on Cr of 3.19).  Medical History: Past Medical History  Diagnosis Date  . Hypertension   . Gout   . Glaucoma   . COPD (chronic obstructive pulmonary disease)    Assessment: 86yom admitted on 9/8 with abdominal pain felt to be due to AAA, found to have critical symptomatic aortic stenosis. Underwent endovascular AAA repair on 9/8 with valvuloplasty. He developed worsening shock, and started empiric vancomycin and zosyn for possible abdominal infection. No laparotomy at this point, d/t high risk. He is also now in acute renal failure with sCr 1.27 >> 3.19, no cultures. Vancomycin trough level was checked today d/t concern for worsening renal function, level = 12.2, 750 mg Q 24 hrs seems to be an appropriate dose currently.  vanc 9/8 >> 9/8 pre-op vanc 1g @ 0518, then post-op 9/9  @ 1037 + 250 mg  9/10 VR = 8.3 at 1200  Zosyn 9/9 >>   Goal of Therapy:  Vancomycin trough level 15-20  mcg/ml  Plan:  - Change zosyn to 2.25 g IV Q 8 - Continue vancomycin 750 mg IV Q 24 hrs for now - Will watch renal function change closely   Bayard Hugger, PharmD, BCPS  Clinical Pharmacist  Pager: [phone]   11/09/2013,3:39 PM

## 2013-11-09 NOTE — Progress Notes (Addendum)
    Subjective  - Intubated opens eyes to stimulus.    Objective 106/64 90 99 F (37.2 C) (Core (Comment)) 30 97%  Intake/Output Summary (Last 24 hours) at 11/09/13 0745 Last data filed at 11/09/13 0700  Gross per 24 hour  Intake 6500.96 ml  Output    651 ml  Net 5849.96 ml    Left DP doppler, right no doppler signals.  Foot appears viable cap reill brisk.  AROM intact Groins soft Right abdomin firm(better than yesterday per Dr. Nicky Pugh exam) Left abdomin softer, + BS    Assessment/Planning: POD #3 s/p Valvuloplasty, EVAR, R iliofem EA w/ BPA Anemia of unknown cause HGB 8.4.  Will Monitor Urine output 20 cc per hour unchanged.  CR 3.01 now 3.16 WBC 16.5 continue Zosyn and vancomycin Abdomen positive BS will continue to observe     Thomasena Edis [facility] 11/09/2013 7:45 AM --  Laboratory Lab Results:  Recent Labs  11/08/13 0345 11/09/13 0430  WBC 16.5* 16.5*  HGB 9.7* 8.4*  HCT 29.0* 24.7*  PLT 74* 64*   BMET  Recent Labs  11/08/13 1650 11/09/13 0430  NA 140 138  K 4.4 4.2  CL 108 106  CO2 17* 13*  GLUCOSE 124* 102*  BUN 36* 41*  CREATININE 3.02* 3.19*  CALCIUM 6.7* 6.7*    COAG Lab Results  Component Value Date   INR 2.08* 11/08/2013   INR 1.65* 11/07/2013   INR 1.64* 11/07/2013   No results found for this basename: PTT    Addendum  I have independently interviewed and examined the patient, and I agree with the physician assistant's findings.  Abd exam is improved with less grimace with right side palpation, no guarding today.  Feet are unchanged and viable.    Stable course overnight. Phenylephrine @ 175 mcg/min, UOP tapering off but Cr climb appears to be stabilizing somewhat at 3.2.  Somewhat more acidotic but not profundly as would be expect with dead bowel.    - Overall, no significant changes overnight - Consider D/C SG catheter is no longer being used - Would check coag to make certain no active DIC given drifting H/H.   -  Recheck abd KUB   [name], MD [facility] Office: [phone] Pager: [phone]  11/09/2013, 8:22 AM

## 2013-11-09 NOTE — Progress Notes (Signed)
   Pt seen earlier tonight with improved abd exam as earlier today.  Vasopressor requirements have decreased 60 mcg/min Pheylephrine.  UOP has picked.  - discussed findings with family at bedside  [name], MD [facility] [place] Office: [phone] Pager: [phone]  11/09/2013, 5PM

## 2013-11-09 NOTE — Progress Notes (Signed)
eLink Physician-Brief Progress Note Patient Name: Shawn Mathews DOB: 03-13-1927 MRN: [id number]   Date of Service  11/09/2013  HPI/Events of Note  Oliguria Metabolic acidosis; high RR on vent  eICU Interventions  500cc saline bolus given     Intervention Category Major Interventions: Acid-Base disturbance - evaluation and management Intermediate Interventions: Oliguria - evaluation and management  MCQUAID, DOUGLAS 11/09/2013, 4:54 AM

## 2013-11-09 NOTE — Progress Notes (Signed)
MD McQuaid updated no change in UOP after 500cc bolus and with morning blood gas results. No new orders at this time. Will continue to monitor. Carlos Levering

## 2013-11-09 NOTE — Progress Notes (Signed)
     SUBJECTIVE: Pt intubated.   BP 106/64  Pulse 90  Temp(Src) 99 F (37.2 C) (Core (Comment))  Resp 30  Ht  (1.778 m)  Wt 170 lb 6.7 oz (77.3 kg)  BMI 24.45 kg/m2  SpO2 97%  Intake/Output Summary (Last 24 hours) at 11/09/13 0816 Last data filed at 11/09/13 0800  Gross per 24 hour  Intake 6271.56 ml  Output    641 ml  Net 5630.56 ml    PHYSICAL EXAM General: Intubated, sedated.  Neck: No JVD. No masses noted.  Lungs: Mechanical breath sounds bilaterally. No wheezes or rhonci noted.  Heart: Tachy with slight systolic murmur noted.  Abdomen: No bowel sounds noted, tender, firm.  Extremities: No lower extremity edema.   LABS: Basic Metabolic Panel:  Recent Labs  13/08/65 1735 11/07/13 2038  11/08/13 1650 11/09/13 0430  NA 139  --   < > 140 138  K 4.5  --   < > 4.4 4.2  CL 106  --   < > 108 106  CO2 18*  --   < > 17* 13*  GLUCOSE 107*  --   < > 124* 102*  BUN 27*  --   < > 36* 41*  CREATININE 2.49*  --   < > 3.02* 3.19*  CALCIUM 7.5*  --   < > 6.7* 6.7*  MG  --  1.7  --   --   --   < > = values in this interval not displayed. CBC:  Recent Labs  11/08/13 11/08/13 0345 11/09/13 0430  WBC 17.9* 16.5* 16.5*  NEUTROABS 16.1* 15.0*  --   HGB 10.2* 9.7* 8.4*  HCT 29.9* 29.0* 24.7*  MCV 94.0 91.8 92.2  PLT 70* 74* 64*   Cardiac Enzymes:  Recent Labs  11/23/2013 1614 11/01/2013 2315 11/07/13 0400  TROPONINI 2.29* 3.94* 5.91*   Current Meds: . antiseptic oral rinse  7 mL Mouth Rinse QID  . chlorhexidine  15 mL Mouth Rinse BID  . dorzolamide  1 drop Both Eyes BID  . feeding supplement (ENSURE COMPLETE)  237 mL Oral BID BM  . fentaNYL  50 mcg Intravenous Once  . insulin aspart  0-9 Units Subcutaneous 6 times per day  . pantoprazole (PROTONIX) IV  40 mg Intravenous Q24H  . piperacillin-tazobactam (ZOSYN)  IV  2.25 g Intravenous 3 times per day  . vancomycin  750 mg Intravenous Q24H     ASSESSMENT AND PLAN: 80 yo male admitted with abdominal pain  and found to have large AAA and critical AS. Now s/p balloon aortic valvuloplasty and EVAR. Shock developed post procedure and felt to be due to abdominal process with high probability of watershed bowel infarction.    1. Critical aortic valve stenosis: s/p balloon aortic valvuloplasty per Dr. [name] 11/28/2013. Pt developed tachycardia, hypotension the evening following his procedure. Echo 11/07/13 with normal LV function, small non-clinically significant effusion, no signficant AI. His hemodynamic instability is not felt to be cardiac related.   2. AAA: s/p EVAR 11/20/2013. Vascular surgery following.   3. Shock: Pt remains on pressors this am. Probable acute abdominal process. Surgery is following but no plans for surgical exploration at this time. PCCM also following. Elevated troponin felt to be due to demand ischemia. Pt with minimal CAD by cath last week. LV function has been normal. Continue supportive care.    Keriann Rankin  9/11/20158:16 AM

## 2013-11-09 NOTE — Progress Notes (Signed)
Dr. Kendrick Fries made aware of UOP decline to <20cc/hr. New orders received for 500cc NS bolus, will implement & continue to monotor.  Carlos Levering

## 2013-11-10 ENCOUNTER — Inpatient Hospital Stay (HOSPITAL_COMMUNITY): Payer: Medicare Other

## 2013-11-10 DIAGNOSIS — R1084 Generalized abdominal pain: Secondary | ICD-10-CM

## 2013-11-10 LAB — POCT I-STAT 3, ART BLOOD GAS (G3+)
ACID-BASE EXCESS: 2 mmol/L (ref 0.0–2.0)
Bicarbonate: 24.7 mEq/L — ABNORMAL HIGH (ref 20.0–24.0)
O2 Saturation: 92 %
PO2 ART: 54 mmHg — AB (ref 80.0–100.0)
Patient temperature: 98.2
TCO2: 26 mmol/L (ref 0–100)
pCO2 arterial: 30 mmHg — ABNORMAL LOW (ref 35.0–45.0)
pH, Arterial: 7.522 — ABNORMAL HIGH (ref 7.350–7.450)

## 2013-11-10 LAB — BASIC METABOLIC PANEL
Anion gap: 15 (ref 5–15)
BUN: 49 mg/dL — AB (ref 6–23)
CHLORIDE: 104 meq/L (ref 96–112)
CO2: 19 mEq/L (ref 19–32)
Calcium: 7 mg/dL — ABNORMAL LOW (ref 8.4–10.5)
Creatinine, Ser: 3.36 mg/dL — ABNORMAL HIGH (ref 0.50–1.35)
GFR calc Af Amer: 18 mL/min — ABNORMAL LOW (ref >90)
GFR, EST NON AFRICAN AMERICAN: 15 mL/min — AB (ref >90)
Glucose, Bld: 105 mg/dL — ABNORMAL HIGH (ref 70–99)
POTASSIUM: 3.8 meq/L (ref 3.7–5.3)
SODIUM: 138 meq/L (ref 137–147)

## 2013-11-10 LAB — PROTIME-INR
INR: 1.75 — ABNORMAL HIGH (ref 0.00–1.49)
Prothrombin Time: 20.4 seconds — ABNORMAL HIGH (ref 11.6–15.2)

## 2013-11-10 LAB — GLUCOSE, CAPILLARY
GLUCOSE-CAPILLARY: 105 mg/dL — AB (ref 70–99)
GLUCOSE-CAPILLARY: 97 mg/dL (ref 70–99)
Glucose-Capillary: 94 mg/dL (ref 70–99)
Glucose-Capillary: 104 mg/dL — ABNORMAL HIGH (ref 70–99)
Glucose-Capillary: 95 mg/dL (ref 70–99)

## 2013-11-10 LAB — CBC
HEMATOCRIT: 23 % — AB (ref 39.0–52.0)
HEMOGLOBIN: 8 g/dL — AB (ref 13.0–17.0)
MCH: 31.3 pg (ref 26.0–34.0)
MCHC: 34.8 g/dL (ref 30.0–36.0)
MCV: 89.8 fL (ref 78.0–100.0)
Platelets: 55 10*3/uL — ABNORMAL LOW (ref 150–400)
RBC: 2.56 MIL/uL — ABNORMAL LOW (ref 4.22–5.81)
RDW: 15.8 % — AB (ref 11.5–15.5)
WBC: 16.5 10*3/uL — ABNORMAL HIGH (ref 4.0–10.5)

## 2013-11-10 LAB — PREPARE RBC (CROSSMATCH)

## 2013-11-10 LAB — LACTIC ACID, PLASMA: Lactic Acid, Venous: 1.7 mmol/L (ref 0.5–2.2)

## 2013-11-10 MED ORDER — SODIUM CHLORIDE 0.9 % IV SOLN
Freq: Once | INTRAVENOUS | Status: AC
  Administered 2013-11-10: 14:00:00 via INTRAVENOUS

## 2013-11-10 MED ORDER — SODIUM CHLORIDE 0.9 % IV SOLN
Freq: Once | INTRAVENOUS | Status: AC
  Administered 2013-11-10: 18:00:00 via INTRAVENOUS

## 2013-11-10 NOTE — Progress Notes (Signed)
RT to bedside to make vent changes.

## 2013-11-10 NOTE — Progress Notes (Signed)
Called Dr. Katrinka Blazing with results of ABG.  Received orders to change RR to 20 and FIO2 to 50%.  RT called to advise of changes.  Will continue to monitor.

## 2013-11-10 NOTE — Progress Notes (Signed)
Advised Dr. Katrinka Blazing at elink of pt increased PVCs and increased need for Neo.  Received orders for ABG, BMP and Mg.  Will draw labs and monitor for results.  Will continue to monitor pt.

## 2013-11-10 NOTE — Progress Notes (Signed)
PULMONARY / CRITICAL CARE MEDICINE   Name: Shawn Mathews MRN: [id number] DOB: 04-17-1927    ADMISSION DATE:  10/19/2013 CONSULTATION DATE:  9/8  REFERRING MD :  Imogene Burn  CHIEF COMPLAINT:  Post op vent management  INITIAL PRESENTATION: 80 y/o male admitted on 9/8 with abdominal pain felt to be due to AAA, found to have critical symptomatic aortic stenosis.  Underwent endovascular AAA repair on 9/8 with valvuloplasty so PCCM consulted for post op vent management.  STUDIES:  8/31 CT angio AB > AAA 8.5 cm, tortuos R iliac arterial system, occluded SFA, 17 mm renal mass left 9/4 CT angio heart/chest> emphysema, calcified mitral and aortic valves, calcified trileaflet aortic valve  SIGNIFICANT EVENTS: 9/8 Aortic valvuloplasty, bilat common fem artery cannulation, R iliofemoral endarterectomy, repair left common femoral artery, aortogram, repair of aorta with bifurcated prosthesis, aortic valvuloplasty 9/9- worsening shock, tachy   SUBJECTIVE:  Slightly improved overall.  Weaning neo.  Lactate much improved.  Awake.   VITAL SIGNS: Temp:  [97.9 F (36.6 C)-99.1 F (37.3 C)] 98.2 F (36.8 C) (09/12 0745) Pulse Rate:  [88-110] 100 (09/12 0844) Resp:  [20-28] 28 (09/12 0844) BP: (75-108)/(52-71) 106/58 mmHg (09/12 0844) SpO2:  [90 %-100 %] 94 % (09/12 0844) Arterial Line BP: (82-129)/(49-68) 90/56 mmHg (09/12 0715) FiO2 (%):  [40 %-50 %] 40 % (09/12 0844) Weight:  [196 lb 10.4 oz (89.2 kg)] 196 lb 10.4 oz (89.2 kg) (09/12 0530) HEMODYNAMICS: PAP: (35-54)/(19-29) 54/29 mmHg CVP:  [8 mmHg-13 mmHg] 13 mmHg VENTILATOR SETTINGS: Vent Mode:  [-] PRVC FiO2 (%):  [40 %-50 %] 40 % Set Rate:  [25 bmp] 25 bmp Vt Set:  [580 mL] 580 mL PEEP:  [5 cmH20] 5 cmH20 Plateau Pressure:  [18 cmH20-23 cmH20] 18 cmH20 INTAKE / OUTPUT:  Intake/Output Summary (Last 24 hours) at 11/10/13 0905 Last data filed at 11/10/13 0800  Gross per 24 hour  Intake 3606.13 ml  Output    915 ml  Net 2691.13 ml     PHYSICAL EXAMINATION: General:  Chronically ill appearing male, NAD on vent  HEENT: NCAT, PERRL, ETT in place PULM: resps even non labored on full support, coarse, diminished bases  CV: RRR, distant heart sounds, apex murmur AB: firm, tender diffusely,no BS, no rebound, guarding voluntary Ext: cool, minimal edema, palpable rt, doplerable left dp Neuro: awake, tracks, nods occasionally, RASS 0  LABS:  CBC  Recent Labs Lab 11/08/13 0345 11/09/13 0430 11/10/13 0355  WBC 16.5* 16.5* 16.5*  HGB 9.7* 8.4* 8.0*  HCT 29.0* 24.7* 23.0*  PLT 74* 64* 55*   Coag's  Recent Labs Lab 11/07/13 1100 11/08/13 0345 11/09/13 0800 11/10/13 0355  APTT 40* 54* 53*  --   INR 1.65* 2.08*  --  1.75*   BMET  Recent Labs Lab 11/09/13 0430 11/09/13 1813 11/10/13 0355  NA 138 134* 138  K 4.2 3.9 3.8  CL 106 102 104  CO2 13* 17* 19  BUN 41* 46* 49*  CREATININE 3.19* 3.35* 3.36*  GLUCOSE 102* 113* 105*   Electrolytes  Recent Labs Lab 11/07/13 1735 11/07/13 2038  11/09/13 0430 11/09/13 1813 11/10/13 0355  CALCIUM 7.5*  --   < > 6.7* 6.9* 7.0*  MG  --  1.7  --   --   --   --   < > = values in this interval not displayed. Sepsis Markers  Recent Labs Lab 11/08/13 11/09/13 0950 11/10/13 0355  LATICACIDVEN 2.6* 3.3* 1.7   ABG  Recent Labs  Lab 11/08/13 2000 11/09/13 0446 11/09/13 1108  PHART 7.304* 7.290* 7.316*  PCO2ART 30.8* 29.4* 28.2*  PO2ART 72.0* 69.0* 71.0*   Liver Enzymes  Recent Labs Lab 11/08/13 0345  AST 111*  ALT 59*  ALKPHOS 44  BILITOT 1.2  ALBUMIN 2.3*   Cardiac Enzymes  Recent Labs Lab 11/10/13 1614 11-10-13 2315 11/07/13 0400  TROPONINI 2.29* 3.94* 5.91*   Glucose  Recent Labs Lab 11/09/13 0742 11/09/13 1259 11/09/13 1654 11/09/13 1956 11/10/13 0011 11/10/13 0413  GLUCAP 90 110* 100* 113* 97 105*    Imaging Dg Chest Port 1 View  11/09/2013   CLINICAL DATA:  Post abdominal aortic stent graft.  EXAM: PORTABLE CHEST - 1  VIEW  COMPARISON:  11/08/2013; 11/07/2013  FINDINGS: Grossly unchanged enlarged cardiac silhouette and mediastinal contours given reduced lung volumes and patient rotation. Stable positioning of support apparatus including right jugular approach PA catheter coiled overlying the expected location of the main pulmonary outflow tract. No pneumothorax.  Lung volumes remain reduced with unchanged trace effusions and associated bibasilar opacities, left greater than right. No new focal airspace opacities. No evidence of edema. Unchanged bones.  IMPRESSION: 1. Stable positioning of support apparatus including tip of right jugular approach PA catheter coiled over the main pulmonary artery outflow tract. No pneumothorax. 2. Unchanged trace bilateral effusions and associated bibasilar opacities, left greater than right, atelectasis versus infiltrate. 3. No evidence of edema.   Electronically Signed   By: Simonne Come M.D.   On: 11/09/2013 07:44   Dg Abd Portable 1v  11/09/2013   CLINICAL DATA:  Abdomen distention.  EXAM: PORTABLE ABDOMEN - 1 VIEW  COMPARISON:  CT 11/07/2013.  FINDINGS: NG tube noted in stomach. Soft tissue structures are stable. Abdominal aortic aneurysm with stent graft again noted. No bowel distention. Moderate amount of stool in colon. No free air. Bilateral hip replacements.  IMPRESSION: 1. NG tube noted in stomach.  No bowel distention.  No free air. 2. Under amount of stool in colon. 3. Abdominal aortic aneurysm with stent graft again noted. 4. Bilateral hip replacements.   Electronically Signed   By: Maisie Fus  Register   On: 11/09/2013 09:43     ASSESSMENT / PLAN:  PULMONARY OETT 9/8 >> A: COPD Acute resp failure P:   -cont full support  -no weaning, remains acidotic and on pressors -in an if pressors trend good and ph above 7.32 and MV lower, less 12 liters per min will consider sbt attempts with close observation TV  -duoneb scheduled -f/u CXR in am  -keep bicarb seems ot be  responding well even through was lactic  CARDIOVASCULAR CVL R IJ introducer/RHC A: Shock stabilising, r/o SIRS abdo ischemia Echo - nml LV fn P:  - avoid vaso with suspected bowel ischemia -continue neo and wean as able, likely to dc today  -f/u lactate , reassuring  RENAL A:  Acute on chronic renal failure, cr rising P:   -monitor urine output -bmet to daily -cont bicarb gtt see pulm -no acute indication CVVH but monitor   GASTROINTESTINAL A: Watershed bowel  ischemia s/p AAA repair at risk IMA P:   -NPO -Surgery evaluating, agree that high surgical risk - does seem to be improving slowly  HEMATOLOGIC A:  Minor bleeding from groin site in OR resolved, now s/p PRBC Thrombocytopenia consumptive likley Hgb, plt cont to trend down 9/12 Coagulopathy -likely vit K def,fibrinogen ok P:  -Transfusion goal < 7gm/dL  - f/u coags  - FFP to give  per VVS  INFECTIOUS A:  R/o  Bowel ischemia, r/o abdo sepsis P:   BC 9/9>>> ct empiric abdo coverage, improved pressors, no entroccus or staph, dc vanc 9/9 zosyn>>> 9/9 vanc>>>9/12  ENDOCRINE A:  Mild hyperglycemia, now off insulin gtt but no history of DM2 -cortisol high, tsh ok P:   -monitor CBG   NEUROLOGIC A:  Pain control post surgery Sedation for vent synchrony required P:   RASS goal: 0 Fentanyl gtt Versed prn ok as BP permits  TODAY'S SUMMARY: Shock slowly improving.  Almost off pressors, lactate now normal.  Improving clinmically Keep biocarb abg in am  , likley sbt in am    I have personally obtained a history, examined the patient, evaluated laboratory and imaging results, formulated the assessment and plan and placed orders. CRITICAL CARE: The patient is critically ill with multiple organ systems failure and requires high complexity decision making for assessment and support, frequent evaluation and titration of therapies, application of advanced monitoring technologies and extensive interpretation of multiple  databases. Critical Care Time devoted to patient care services described in this note is 30 minutes.    Dirk Dress, NP 11/10/2013  9:05 AM Pager: [phone] or [phone]  Mcarthur Rossetti. Tyson Alias, MD, FACP Pgr: [phone] [facility]

## 2013-11-10 NOTE — Progress Notes (Signed)
4 Days Post-Op  Subjective: Intubated and critically ill  Objective: Vital signs in last 24 hours: Temp:  [97.9 F (36.6 C)-99.1 F (37.3 C)] 98.2 F (36.8 C) (09/12 0745) Pulse Rate:  [86-110] 97 (09/12 0715) Resp:  [20-30] 25 (09/12 0715) BP: (75-108)/(52-71) 84/58 mmHg (09/12 0700) SpO2:  [90 %-100 %] 94 % (09/12 0715) Arterial Line BP: (82-129)/(49-68) 90/56 mmHg (09/12 0715) FiO2 (%):  [40 %-50 %] 40 % (09/12 0700) Weight:  [196 lb 10.4 oz (89.2 kg)] 196 lb 10.4 oz (89.2 kg) (09/12 0530) Last BM Date: 11-17-13  Intake/Output from previous day: 09/11 0701 - 09/12 0700 In: 3828.3 [I.V.:3408.3; NG/GT:120; IV Piggyback:300] Out: 875 [Urine:775; Emesis/NG output:100] Intake/Output this shift: Total I/O In: -  Out: 80 [Urine:80]  Resp: clear to auscultation bilaterally Cardio: regular rate and rhythm and neo able to be weaned overnight GI: diffusely tender. worse on right  Lab Results:   Recent Labs  11/09/13 0430 11/10/13 0355  WBC 16.5* 16.5*  HGB 8.4* 8.0*  HCT 24.7* 23.0*  PLT 64* 55*   BMET  Recent Labs  11/09/13 1813 11/10/13 0355  NA 134* 138  K 3.9 3.8  CL 102 104  CO2 17* 19  GLUCOSE 113* 105*  BUN 46* 49*  CREATININE 3.35* 3.36*  CALCIUM 6.9* 7.0*   PT/INR  Recent Labs  11/08/13 0345 11/10/13 0355  LABPROT 23.4* 20.4*  INR 2.08* 1.75*   ABG  Recent Labs  11/09/13 0446 11/09/13 1108  PHART 7.290* 7.316*  HCO3 14.1* 14.4*    Studies/Results: Dg Chest Port 1 View  11/10/2013   CLINICAL DATA:  Pneumonitis  EXAM: PORTABLE CHEST - 1 VIEW  COMPARISON:  11/09/2013  FINDINGS: Right internal jugular vascular sheath status post removal of Swan-Ganz catheter. Adjacent central line same location stable. No change in position of endotracheal tube above the carina. NG tube again identified projecting over the stomach.  Mild cardiac enlargement. Limited inspiratory effect. Vascular pattern within normal limits with no evidence of pulmonary  edema. Retrocardiac opacity stable. Mild atelectatic change right lung base stable.  IMPRESSION: Stable bilateral lower lobe opacities.   Electronically Signed   By: Esperanza Heir M.D.   On: 11/10/2013 08:00   Dg Chest Port 1 View  11/09/2013   CLINICAL DATA:  Post abdominal aortic stent graft.  EXAM: PORTABLE CHEST - 1 VIEW  COMPARISON:  11/08/2013; 11/07/2013  FINDINGS: Grossly unchanged enlarged cardiac silhouette and mediastinal contours given reduced lung volumes and patient rotation. Stable positioning of support apparatus including right jugular approach PA catheter coiled overlying the expected location of the main pulmonary outflow tract. No pneumothorax.  Lung volumes remain reduced with unchanged trace effusions and associated bibasilar opacities, left greater than right. No new focal airspace opacities. No evidence of edema. Unchanged bones.  IMPRESSION: 1. Stable positioning of support apparatus including tip of right jugular approach PA catheter coiled over the main pulmonary artery outflow tract. No pneumothorax. 2. Unchanged trace bilateral effusions and associated bibasilar opacities, left greater than right, atelectasis versus infiltrate. 3. No evidence of edema.   Electronically Signed   By: Simonne Come M.D.   On: 11/09/2013 07:44   Dg Abd Portable 1v  11/09/2013   CLINICAL DATA:  Abdomen distention.  EXAM: PORTABLE ABDOMEN - 1 VIEW  COMPARISON:  CT 11/07/2013.  FINDINGS: NG tube noted in stomach. Soft tissue structures are stable. Abdominal aortic aneurysm with stent graft again noted. No bowel distention. Moderate amount of stool in colon. No  free air. Bilateral hip replacements.  IMPRESSION: 1. NG tube noted in stomach.  No bowel distention.  No free air. 2. Under amount of stool in colon. 3. Abdominal aortic aneurysm with stent graft again noted. 4. Bilateral hip replacements.   Electronically Signed   By: Maisie Fus  Register   On: 11/09/2013 09:43    Anti-infectives: Anti-infectives    Start     Dose/Rate Route Frequency Ordered Stop   11/09/13 1600  vancomycin (VANCOCIN) IVPB 750 mg/150 ml premix     750 mg 150 mL/hr over 60 Minutes Intravenous Every 24 hours 11/09/13 1539     11/08/13 2000  piperacillin-tazobactam (ZOSYN) IVPB 2.25 g     2.25 g 100 mL/hr over 30 Minutes Intravenous 3 times per day 11/08/13 1204     11/08/13 1500  vancomycin (VANCOCIN) IVPB 750 mg/150 ml premix  Status:  Discontinued     750 mg 150 mL/hr over 60 Minutes Intravenous Every 24 hours 11/08/13 1449 11/09/13 1404   11/07/13 1215  vancomycin (VANCOCIN) 250 mg in sodium chloride 0.9 % 100 mL IVPB     250 mg 100 mL/hr over 60 Minutes Intravenous  Once 11/07/13 1209 11/07/13 1559   11/07/13 1200  piperacillin-tazobactam (ZOSYN) IVPB 3.375 g  Status:  Discontinued     3.375 g 12.5 mL/hr over 240 Minutes Intravenous 3 times per day 11/07/13 1154 11/08/13 1204   11/07/13 1000  vancomycin (VANCOCIN) 500 mg in sodium chloride 0.9 % 100 mL IVPB  Status:  Discontinued     500 mg 100 mL/hr over 60 Minutes Intravenous Every 24 hours 11/07/13 0905 11/07/13 1208   11/02/2013 0615  levofloxacin (LEVAQUIN) IVPB 500 mg     500 mg 100 mL/hr over 60 Minutes Intravenous To Surgery 11/16/2013 0605 11/25/2013 0820   11/09/2013 0600  vancomycin (VANCOCIN) IVPB 1000 mg/200 mL premix  Status:  Discontinued     1,000 mg 200 mL/hr over 60 Minutes Intravenous On call to O.R. 11/05/13 0745 11/20/2013 1450   11/12/2013 0600  vancomycin (VANCOCIN) IVPB 1000 mg/200 mL premix  Status:  Discontinued     1,000 mg 200 mL/hr over 60 Minutes Intravenous To Surgery 10/30/13 0730 11/01/13 1740      Assessment/Plan: s/p Procedure(s) with comments: ABDOMINAL AORTIC ENDOVASCULAR STENT GRAFT REPAIR  (Bilateral) BALLOON AORTIC VALVE VALVULOPLASTY (N/A) - Cooper will start first ENDARTERECTOMY FEMORAL WITH PATCH ANGIOPLASTY (Right) Probable ischemic bowel. He remains acidotic on a bicarb drip but he has made some small progress since  yesterday. Agree with Vanc and Zosyn It is not clear whether or not he will get better without surgery but it is also not clear if he would tolerate surgery. Will continue to follow closely with Vascular Surgery  LOS: 12 days    TOTH III,PAUL S 11/10/2013

## 2013-11-10 NOTE — Progress Notes (Signed)
   Daily Progress Note  Assessment/Planning: POD #4 s/p Valvuloplasty, EVAR   NEURO: sed to assist vent  PULM: mgmt per PCCM  CV: nearly off vasopressor with additional of bicarbonate drip  GI: intra-abd process likely, unfortunately unclear exact what so unable to risk stratify.  At some point, reimaging will need to be done vs. scope  FEN: bicarb drip per PCCM, may need to start TPN  REN:  maintaining >=30 cc/hr , ARF has stabilized  HEME: will transfuse 2 unit pRBC and 2 units FFP  Subjective  - 4 Days Post-Op  No events overnight  Objective Filed Vitals:   11/10/13 0645 11/10/13 0700 11/10/13 0715 11/10/13 0745  BP:  84/58    Pulse: 100 100 97   Temp:    98.2 F (36.8 C)  TempSrc:    Axillary  Resp: Height:      Weight:      SpO2: 93% 93% 94%     Intake/Output Summary (Last 24 hours) at 11/10/13 0847 Last data filed at 11/10/13 0800  Gross per 24 hour  Intake 3698.93 ml  Output    935 ml  Net 2763.93 ml   NEURO altered mental status on Fentanyl drip PULM  Intubated on vent Vent Mode:  [-] PRVC FiO2 (%):  [40 %-50 %] 40 % Set Rate:  [25 bmp] 25 bmp Vt Set:  [580 mL] 580 mL PEEP:  [5 cmH20] 5 cmH20 Plateau Pressure:  [18 cmH20-23 cmH20] 18 cmH20  CV  RRR, on 10 mcg/min Phenylephrine GI  soft, diffuse TTP, no involuntary guarding today VASC  Both feet viable: dopplerable L peroneal, R PT  Laboratory CBC    Component Value Date/Time   WBC 16.5* 11/10/2013 0355   HGB 8.0* 11/10/2013 0355   HCT 23.0* 11/10/2013 0355   PLT 55* 11/10/2013 0355    BMET    Component Value Date/Time   NA 138 11/10/2013 0355   K 3.8 11/10/2013 0355   CL 104 11/10/2013 0355   CO2 19 11/10/2013 0355   GLUCOSE 105* 11/10/2013 0355   BUN 49* 11/10/2013 0355   CREATININE 3.36* 11/10/2013 0355   CALCIUM 7.0* 11/10/2013 0355   GFRNONAA 15* 11/10/2013 0355   GFRAA 18* 11/10/2013 0355    [name], MD [facility] Office:  [phone] Pager: [phone]  11/10/2013, 8:47 AM

## 2013-11-10 NOTE — Progress Notes (Signed)
eLink Physician-Brief Progress Note Patient Name: Shawn Mathews DOB: 1927-07-21 MRN: [id number]   Date of Service  11/10/2013  HPI/Events of Note  Frequent ectopy with some increase in pressor requirement today.  eICU Interventions  Check abg, BMP, mg     Intervention Category Intermediate Interventions: Arrhythmia - evaluation and management  Hallum Russel, P 11/10/2013, 11:14 PM

## 2013-11-10 NOTE — Progress Notes (Signed)
   Interval Progress Note  Some progress over the day in titrating down vasopressor support.  Exam essentially unchanged.  Hopefully the vasopressor support can be titrated off and pt extubated tomorrow.  [name], MD [facility] [place] Office: [phone] Pager: [phone]  11/10/2013, 8:18 PM

## 2013-11-11 ENCOUNTER — Inpatient Hospital Stay (HOSPITAL_COMMUNITY): Payer: Medicare Other

## 2013-11-11 LAB — PREPARE FRESH FROZEN PLASMA
Unit division: 0
Unit division: 0

## 2013-11-11 LAB — PHOSPHORUS: PHOSPHORUS: 4 mg/dL (ref 2.3–4.6)

## 2013-11-11 LAB — POCT I-STAT, CHEM 8
BUN: 43 mg/dL — ABNORMAL HIGH (ref 6–23)
CALCIUM ION: 0.97 mmol/L — AB (ref 1.13–1.30)
CHLORIDE: 99 meq/L (ref 96–112)
Creatinine, Ser: 3.7 mg/dL — ABNORMAL HIGH (ref 0.50–1.35)
GLUCOSE: 109 mg/dL — AB (ref 70–99)
HEMATOCRIT: 27 % — AB (ref 39.0–52.0)
Hemoglobin: 9.2 g/dL — ABNORMAL LOW (ref 13.0–17.0)
Potassium: 3.3 mEq/L — ABNORMAL LOW (ref 3.7–5.3)
Sodium: 138 mEq/L (ref 137–147)
TCO2: 25 mmol/L (ref 0–100)

## 2013-11-11 LAB — POCT I-STAT 3, ART BLOOD GAS (G3+)
ACID-BASE EXCESS: 3 mmol/L — AB (ref 0.0–2.0)
ACID-BASE EXCESS: 2 mmol/L (ref 0.0–2.0)
BICARBONATE: 26 meq/L — AB (ref 20.0–24.0)
Bicarbonate: 26.9 mEq/L — ABNORMAL HIGH (ref 20.0–24.0)
O2 SAT: 92 %
O2 Saturation: 92 %
PH ART: 7.439 (ref 7.350–7.450)
PO2 ART: 57 mmHg — AB (ref 80.0–100.0)
TCO2: 28 mmol/L (ref 0–100)
TCO2: 27 mmol/L (ref 0–100)
pCO2 arterial: 34.7 mmHg — ABNORMAL LOW (ref 35.0–45.0)
pCO2 arterial: 38.4 mmHg (ref 35.0–45.0)
pH, Arterial: 7.497 — ABNORMAL HIGH (ref 7.350–7.450)
pO2, Arterial: 61 mmHg — ABNORMAL LOW (ref 80.0–100.0)

## 2013-11-11 LAB — CBC
HEMATOCRIT: 23 % — AB (ref 39.0–52.0)
HEMOGLOBIN: 8.2 g/dL — AB (ref 13.0–17.0)
MCH: 31.1 pg (ref 26.0–34.0)
MCHC: 35.7 g/dL (ref 30.0–36.0)
MCV: 87.1 fL (ref 78.0–100.0)
Platelets: 49 10*3/uL — ABNORMAL LOW (ref 150–400)
RBC: 2.64 MIL/uL — ABNORMAL LOW (ref 4.22–5.81)
RDW: 16 % — ABNORMAL HIGH (ref 11.5–15.5)
WBC: 18.6 10*3/uL — ABNORMAL HIGH (ref 4.0–10.5)

## 2013-11-11 LAB — GLUCOSE, CAPILLARY
GLUCOSE-CAPILLARY: 108 mg/dL — AB (ref 70–99)
GLUCOSE-CAPILLARY: 106 mg/dL — AB (ref 70–99)
GLUCOSE-CAPILLARY: 84 mg/dL (ref 70–99)
GLUCOSE-CAPILLARY: 84 mg/dL (ref 70–99)
Glucose-Capillary: 98 mg/dL (ref 70–99)
Glucose-Capillary: 99 mg/dL (ref 70–99)
Glucose-Capillary: 111 mg/dL — ABNORMAL HIGH (ref 70–99)

## 2013-11-11 LAB — BASIC METABOLIC PANEL
ANION GAP: 15 (ref 5–15)
BUN: 52 mg/dL — ABNORMAL HIGH (ref 6–23)
CALCIUM: 7 mg/dL — AB (ref 8.4–10.5)
CHLORIDE: 98 meq/L (ref 96–112)
CO2: 23 meq/L (ref 19–32)
Creatinine, Ser: 3.3 mg/dL — ABNORMAL HIGH (ref 0.50–1.35)
GFR calc non Af Amer: 16 mL/min — ABNORMAL LOW (ref >90)
GFR, EST AFRICAN AMERICAN: 18 mL/min — AB (ref >90)
Glucose, Bld: 101 mg/dL — ABNORMAL HIGH (ref 70–99)
Potassium: 3.1 mEq/L — ABNORMAL LOW (ref 3.7–5.3)
SODIUM: 136 meq/L — AB (ref 137–147)

## 2013-11-11 LAB — MAGNESIUM
MAGNESIUM: 1.7 mg/dL (ref 1.5–2.5)
Magnesium: 1.7 mg/dL (ref 1.5–2.5)

## 2013-11-11 MED ORDER — POTASSIUM CHLORIDE 10 MEQ/100ML IV SOLN: 10.0000 meq | INTRAVENOUS | Status: DC

## 2013-11-11 MED ORDER — POTASSIUM CHLORIDE 10 MEQ/50ML IV SOLN
10.0000 meq | INTRAVENOUS | Status: AC
  Administered 2013-11-11 (×3): 10 meq via INTRAVENOUS
  Filled 2013-11-11: qty 50

## 2013-11-11 MED ORDER — FAT EMULSION 20 % IV EMUL
250.0000 mL | INTRAVENOUS | Status: AC
  Administered 2013-11-11: 250 mL via INTRAVENOUS
  Filled 2013-11-11: qty 250

## 2013-11-11 MED ORDER — POTASSIUM CHLORIDE 10 MEQ/50ML IV SOLN
10.0000 meq | INTRAVENOUS | Status: AC
  Administered 2013-11-11 (×4): 10 meq via INTRAVENOUS
  Filled 2013-11-11: qty 50

## 2013-11-11 MED ORDER — TRACE MINERALS CR-CU-F-FE-I-MN-MO-SE-ZN IV SOLN
INTRAVENOUS | Status: AC
  Administered 2013-11-11: 18:00:00 via INTRAVENOUS
  Filled 2013-11-11: qty 1000

## 2013-11-11 NOTE — Progress Notes (Signed)
   Interval Progress Note  Off vasopressors.  Off bicarbonate drip.  Continued tender abd as previous.  UOP improved.  - Hold course until tomorrow - SBT tomorrow reportedly  [name], MD [facility] Office: [phone] Pager: [phone]  11/11/2013, 7:39 PM

## 2013-11-11 NOTE — Progress Notes (Signed)
PULMONARY / CRITICAL CARE MEDICINE   Name: Shawn Mathews MRN: [id number] DOB: 01-08-1928    ADMISSION DATE:  10/13/2013 CONSULTATION DATE:  9/8  REFERRING MD :  Bridgett Larsson  CHIEF COMPLAINT:  Post op vent management  INITIAL PRESENTATION: 80 y/o male admitted on 9/8 with abdominal pain felt to be due to AAA, found to have critical symptomatic aortic stenosis.  Underwent endovascular AAA repair on 9/8 with valvuloplasty so PCCM consulted for post op vent management.  STUDIES:  8/31 CT angio AB > AAA 8.5 cm, tortuos R iliac arterial system, occluded SFA, 17 mm renal mass left 9/4 CT angio heart/chest> emphysema, calcified mitral and aortic valves, calcified trileaflet aortic valve  SIGNIFICANT EVENTS: 9/8 Aortic valvuloplasty, bilat common fem artery cannulation, R iliofemoral endarterectomy, repair left common femoral artery, aortogram, repair of aorta with bifurcated prosthesis, aortic valvuloplasty 9/9- worsening shock, tachy   SUBJECTIVE:  No sig change.  Remains on low dose Neo.    VITAL SIGNS: Temp:  [97.5 F (36.4 C)-99.5 F (37.5 C)] 99 F (37.2 C) (09/13 0700) Pulse Rate:  [28-110] 89 (09/13 0800) Resp:  [18-27] 20 (09/13 0800) BP: (83-126)/(36-86) 110/71 mmHg (09/13 0800) SpO2:  [90 %-97 %] 95 % (09/13 0800) Arterial Line BP: (77-134)/(45-70) 121/64 mmHg (09/13 0800) FiO2 (%):  [40 %-60 %] 60 % (09/13 0800) HEMODYNAMICS: CVP:  [14 mmHg-15 mmHg] 14 mmHg VENTILATOR SETTINGS: Vent Mode:  [-] PRVC FiO2 (%):  [40 %-60 %] 60 % Set Rate:  [20 bmp-25 bmp] 20 bmp Vt Set:  [580 mL] 580 mL PEEP:  [5 cmH20] 5 cmH20 Plateau Pressure:  [17 cmH20-20 cmH20] 18 cmH20 INTAKE / OUTPUT:  Intake/Output Summary (Last 24 hours) at 11/11/13 0957 Last data filed at 11/11/13 0800  Gross per 24 hour  Intake 4067.77 ml  Output   1065 ml  Net 3002.77 ml    PHYSICAL EXAMINATION: General:  Chronically ill appearing male, NAD on vent  HEENT: NCAT, PERRL, ETT in place PULM: resps even non  labored on full support, coarse, diminished bases  CV: RRR, distant heart sounds, apex murmur AB: firm, tender diffusely,no BS, no rebound, guarding voluntary Ext: cool, 1+ edema palpable rt, doplerable left dp Neuro: awake, tracks, nods occasionally, RASS 0  LABS:  CBC  Recent Labs Lab 11/09/13 0430 11/10/13 0355 11/11/13 0407 11/11/13 0844  WBC 16.5* 16.5* 18.6*  --   HGB 8.4* 8.0* 8.2* 9.2*  HCT 24.7* 23.0* 23.0* 27.0*  PLT 64* 55* 49*  --    Coag's  Recent Labs Lab 11/07/13 1100 11/08/13 0345 11/09/13 0800 11/10/13 0355  APTT 40* 54* 53*  --   INR 1.65* 2.08*  --  1.75*   BMET  Recent Labs Lab 11/09/13 1813 11/10/13 0355 11/10/13 2317 11/11/13 0844  NA 134* 138 136* 138  K 3.9 3.8 3.1* 3.3*  CL 102 104 98 99  CO2 17* 19 23  --   BUN 46* 49* 52* 43*  CREATININE 3.35* 3.36* 3.30* 3.70*  GLUCOSE 113* 105* 101* 109*   Electrolytes  Recent Labs Lab 11/07/13 2038  11/09/13 1813 11/10/13 0355 11/10/13 2317 11/11/13 0407  CALCIUM  --   < > 6.9* 7.0* 7.0*  --   MG 1.7  --   --   --  1.7 1.7  PHOS  --   --   --   --   --  4.0  < > = values in this interval not displayed. Sepsis Markers  Recent Labs Lab 11/08/13  11/09/13 0950 11/10/13 0355  LATICACIDVEN 2.6* 3.3* 1.7   ABG  Recent Labs Lab 11/09/13 1108 11/10/13 2325 11/11/13 0411  PHART 7.316* 7.522* 7.497*  PCO2ART 28.2* 30.0* 34.7*  PO2ART 71.0* 54.0* 57.0*   Liver Enzymes  Recent Labs Lab 11/08/13 0345  AST 111*  ALT 59*  ALKPHOS 44  BILITOT 1.2  ALBUMIN 2.3*   Cardiac Enzymes  Recent Labs Lab 11/08/2013 1614 11/18/2013 2315 11/07/13 0400  TROPONINI 2.29* 3.94* 5.91*   Glucose  Recent Labs Lab 11/10/13 0413 11/10/13 0725 11/10/13 1104 11/10/13 1523 11/10/13 2019 11/11/13 0420  GLUCAP 105* 108* 94 104* 95 98    Imaging Dg Chest Port 1 View  11/10/2013   CLINICAL DATA:  Pneumonitis  EXAM: PORTABLE CHEST - 1 VIEW  COMPARISON:  11/09/2013  FINDINGS: Right internal  jugular vascular sheath status post removal of Swan-Ganz catheter. Adjacent central line same location stable. No change in position of endotracheal tube above the carina. NG tube again identified projecting over the stomach.  Mild cardiac enlargement. Limited inspiratory effect. Vascular pattern within normal limits with no evidence of pulmonary edema. Retrocardiac opacity stable. Mild atelectatic change right lung base stable.  IMPRESSION: Stable bilateral lower lobe opacities.   Electronically Signed   By: Skipper Cliche M.D.   On: 11/10/2013 08:00     ASSESSMENT / PLAN:  PULMONARY OETT 9/8 >> A: COPD Acute resp failure Small resp alk P:   -SBT today cpap5 ps 5-10 as goal -duoneb scheduled -f/u CXR in am  -reduce MV on vent as lactic acidosis improved, repeat abg in am   CARDIOVASCULAR CVL R IJ introducer/RHC A: Shock stabilizing, r/o SIRS abdo ischemia Echo - nml LV fn P:  - avoid vaso with suspected bowel ischemia -continue neo and wean as able, likely to dc today   RENAL A:  Acute on chronic renal failure, cr rising, hypoK P:   -monitor urine output -bmet to daily -d/c HCO3 gtt  -f/u ABG , see pulm -no acute indication CVVH but monitor  -supp K   GASTROINTESTINAL A: Watershed bowel  ischemia s/p AAA repair at risk IMA P:   -NPO -Surgery evaluating, but has improved clinically   HEMATOLOGIC A:  Minor bleeding from groin site in OR resolved, now s/p PRBC Thrombocytopenia consumptive likley Hgb, plt cont to trend down 9/12 Coagulopathy -likely vit K def,fibrinogen ok P:  -Transfusion goal < 7gm/dL  - f/u coags  - FFP to give per VVS if bleeding noted  INFECTIOUS A:  R/o  Bowel ischemia, r/o abdo sepsis P:   BC 9/9>>>??collected  9/9 zosyn>>> 9/9 vanc>>>9/12 Does not look like BC were ever drawn - low yield to collect now ct empiric abdo coverage   ENDOCRINE A:  Mild hyperglycemia, now off insulin gtt but no history of DM2 -cortisol high, tsh ok P:    -monitor CBG  NEUROLOGIC A:  Pain control post surgery Sedation for vent synchrony required P:   RASS goal: 0 Fentanyl gtt Versed prn ok as BP permits  TODAY'S SUMMARY: Shock slowly improving.  D/c HCo3 gtt.  Monitor renal function closely.  SBT when FiO2 needs improved., reduce MV   I have personally obtained a history, examined the patient, evaluated laboratory and imaging results, formulated the assessment and plan and placed orders. CRITICAL CARE: The patient is critically ill with multiple organ systems failure and requires high complexity decision making for assessment and support, frequent evaluation and titration of therapies, application of advanced monitoring technologies  and extensive interpretation of multiple databases. Critical Care Time devoted to patient care services described in this note is 30  minutes.    Nickolas Madrid, NP 11/11/2013  9:57 AM Pager: [phone] or [phone]  *Care during the described time interval was provided by me and/or other providers on the critical care team. I have reviewed this patient's available data, including medical history, events of note, physical examination and test results as part of my evaluation.  Lavon Paganini. Titus Mould, MD, [place] Pgr: [facility]

## 2013-11-11 NOTE — Progress Notes (Signed)
Called elink to update Dr. Katrinka Blazing with regards to [facility] results.  Dr. Katrinka Blazing on a call.  Provided information to [facility] to advise Dr. Katrinka Blazing.  Will continue to monitor.

## 2013-11-11 NOTE — Progress Notes (Signed)
Wasted 40cc Fentanyl drown drain.  Witnessed by Ernie Hew, RN.

## 2013-11-11 NOTE — Progress Notes (Signed)
5 Days Post-Op  Subjective: Intubated and critically ill  Objective: Vital signs in last 24 hours: Temp:  [97.5 F (36.4 C)-99.5 F (37.5 C)] 99 F (37.2 C) (09/13 0700) Pulse Rate:  [28-110] 92 (09/13 0700) Resp:  [18-28] 20 (09/13 0700) BP: (83-126)/(36-86) 104/58 mmHg (09/13 0700) SpO2:  [90 %-97 %] 95 % (09/13 0700) Arterial Line BP: (77-134)/(45-70) 111/60 mmHg (09/13 0700) FiO2 (%):  [40 %-60 %] 60 % (09/13 0423) Last BM Date: 11-04-2013  Intake/Output from previous day: 09/12 0701 - 09/13 0700 In: 4195.4 [I.V.:3061.2; Blood:754.2; NG/GT:30; IV Piggyback:350] Out: 1075 [Urine:1075] Intake/Output this shift:    Resp: rhonchi bilaterally Cardio: regular rate and rhythm GI: diffusely tender, worse on right. exam essentially unchanged  Lab Results:   Recent Labs  11/10/13 0355 11/11/13 0407  WBC 16.5* 18.6*  HGB 8.0* 8.2*  HCT 23.0* 23.0*  PLT 55* 49*   BMET  Recent Labs  11/10/13 0355 11/10/13 2317  NA 138 136*  K 3.8 3.1*  CL 104 98  CO2 19 23  GLUCOSE 105* 101*  BUN 49* 52*  CREATININE 3.36* 3.30*  CALCIUM 7.0* 7.0*   PT/INR  Recent Labs  11/10/13 0355  LABPROT 20.4*  INR 1.75*   ABG  Recent Labs  11/10/13 2325 11/11/13 0411  PHART 7.522* 7.497*  HCO3 24.7* 26.9*    Studies/Results: Dg Chest Port 1 View  11/10/2013   CLINICAL DATA:  Pneumonitis  EXAM: PORTABLE CHEST - 1 VIEW  COMPARISON:  11/09/2013  FINDINGS: Right internal jugular vascular sheath status post removal of Swan-Ganz catheter. Adjacent central line same location stable. No change in position of endotracheal tube above the carina. NG tube again identified projecting over the stomach.  Mild cardiac enlargement. Limited inspiratory effect. Vascular pattern within normal limits with no evidence of pulmonary edema. Retrocardiac opacity stable. Mild atelectatic change right lung base stable.  IMPRESSION: Stable bilateral lower lobe opacities.   Electronically Signed   By: Esperanza Heir M.D.   On: 11/10/2013 08:00   Dg Abd Portable 1v  11/09/2013   CLINICAL DATA:  Abdomen distention.  EXAM: PORTABLE ABDOMEN - 1 VIEW  COMPARISON:  CT 11/07/2013.  FINDINGS: NG tube noted in stomach. Soft tissue structures are stable. Abdominal aortic aneurysm with stent graft again noted. No bowel distention. Moderate amount of stool in colon. No free air. Bilateral hip replacements.  IMPRESSION: 1. NG tube noted in stomach.  No bowel distention.  No free air. 2. Under amount of stool in colon. 3. Abdominal aortic aneurysm with stent graft again noted. 4. Bilateral hip replacements.   Electronically Signed   By: Maisie Fus  Register   On: 11/09/2013 09:43    Anti-infectives: Anti-infectives   Start     Dose/Rate Route Frequency Ordered Stop   11/09/13 1600  vancomycin (VANCOCIN) IVPB 750 mg/150 ml premix  Status:  Discontinued     750 mg 150 mL/hr over 60 Minutes Intravenous Every 24 hours 11/09/13 1539 11/10/13 1201   11/08/13 2000  piperacillin-tazobactam (ZOSYN) IVPB 2.25 g     2.25 g 100 mL/hr over 30 Minutes Intravenous 3 times per day 11/08/13 1204     11/08/13 1500  vancomycin (VANCOCIN) IVPB 750 mg/150 ml premix  Status:  Discontinued     750 mg 150 mL/hr over 60 Minutes Intravenous Every 24 hours 11/08/13 1449 11/09/13 1404   11/07/13 1215  vancomycin (VANCOCIN) 250 mg in sodium chloride 0.9 % 100 mL IVPB     250 mg  100 mL/hr over 60 Minutes Intravenous  Once 11/07/13 1209 11/07/13 1559   11/07/13 1200  piperacillin-tazobactam (ZOSYN) IVPB 3.375 g  Status:  Discontinued     3.375 g 12.5 mL/hr over 240 Minutes Intravenous 3 times per day 11/07/13 1154 11/08/13 1204   11/07/13 1000  vancomycin (VANCOCIN) 500 mg in sodium chloride 0.9 % 100 mL IVPB  Status:  Discontinued     500 mg 100 mL/hr over 60 Minutes Intravenous Every 24 hours 11/07/13 0905 11/07/13 1208   November 30, 2013 0615  levofloxacin (LEVAQUIN) IVPB 500 mg     500 mg 100 mL/hr over 60 Minutes Intravenous To Surgery  30-Nov-2013 0605 11/30/2013 0820   Nov 30, 2013 0600  vancomycin (VANCOCIN) IVPB 1000 mg/200 mL premix  Status:  Discontinued     1,000 mg 200 mL/hr over 60 Minutes Intravenous On call to O.R. 11/05/13 0745 11-30-13 1450   11/20/2013 0600  vancomycin (VANCOCIN) IVPB 1000 mg/200 mL premix  Status:  Discontinued     1,000 mg 200 mL/hr over 60 Minutes Intravenous To Surgery 10/30/13 0730 11/01/13 1740      Assessment/Plan: s/p Procedure(s) with comments: ABDOMINAL AORTIC ENDOVASCULAR STENT GRAFT REPAIR  (Bilateral) BALLOON AORTIC VALVE VALVULOPLASTY (N/A) - Cooper will start first ENDARTERECTOMY FEMORAL WITH PATCH ANGIOPLASTY (Right) Continue vanc and zosyn Probable ischemic bowel. Acidosis seems to be improving. High risk for further surgery because of aortic valve. Will continue to monitor Wean vent and pressors as tolerated  LOS: 13 days    TOTH III,PAUL S 11/11/2013

## 2013-11-11 NOTE — Progress Notes (Signed)
eLink Physician-Brief Progress Note Patient Name: Shawn Mathews DOB: [birth year] MRN: [id number]   Date of Service  11/11/2013  HPI/Events of Note  K 3.1 Creat 3.36  eICU Interventions  Potassium replaced     Intervention Category Minor Interventions: Electrolytes abnormality - evaluation and management  Nordling Russel, P 11/11/2013, 1:46 AM

## 2013-11-11 NOTE — Progress Notes (Signed)
PARENTERAL NUTRITION CONSULT NOTE - INITIAL  Pharmacy Consult for TNA  Indication: Possible bowel ischemia   Allergies  Allergen Reactions  . Penicillins     unknown    Patient Measurements: Height:  (177.8 cm) Weight: 196 lb 10.4 oz (89.2 kg) IBW/kg (Calculated) : 73  Vital Signs: Temp: 98.7 F (37.1 C) (09/13 1100) Temp src: Axillary (09/13 1100) BP: 130/64 mmHg (09/13 1200) Pulse Rate: 91 (09/13 1200) Intake/Output from previous day: 09/12 0701 - 09/13 0700 In: 4195.4 [I.V.:3061.2; Blood:754.2; NG/GT:30; IV Piggyback:350] Out: 1075 [Urine:1075] Intake/Output from this shift: Total I/O In: 548.9 [I.V.:448.9; IV Piggyback:100] Out: 320 [Urine:320]  Labs:  Recent Labs  11/09/13 0430 11/09/13 0800 11/10/13 0355 11/11/13 0407 11/11/13 0844  WBC 16.5*  --  16.5* 18.6*  --   HGB 8.4*  --  8.0* 8.2* 9.2*  HCT 24.7*  --  23.0* 23.0* 27.0*  PLT 64*  --  55* 49*  --   APTT  --  53*  --   --   --   INR  --   --  1.75*  --   --      Recent Labs  11/09/13 1813 11/10/13 0355 11/10/13 2317 11/11/13 0407 11/11/13 0844  NA 134* 138 136*  --  138  K 3.9 3.8 3.1*  --  3.3*  CL 102 104 98  --  99  CO2 17* 19 23  --   --   GLUCOSE 113* 105* 101*  --  109*  BUN 46* 49* 52*  --  43*  CREATININE 3.35* 3.36* 3.30*  --  3.70*  CALCIUM 6.9* 7.0* 7.0*  --   --   MG  --   --  1.7 1.7  --   PHOS  --   --   --  4.0  --    Estimated Creatinine Clearance: 16.7 ml/min (by C-G formula based on Cr of 3.7).    Recent Labs  11/10/13 2019 11/11/13 0050 11/11/13 0420  GLUCAP 95 99 98    Medical History: Past Medical History  Diagnosis Date  . Hypertension   . Gout   . Glaucoma   . COPD (chronic obstructive pulmonary disease)     Medications:  Prescriptions prior to admission  Medication Sig Dispense Refill  . allopurinol (ZYLOPRIM) 100 MG tablet Take by mouth daily.      Marland Kitchen amLODipine (NORVASC) 5 MG tablet Take 5 mg by mouth daily.      . dorzolamide  (TRUSOPT) 2 % ophthalmic solution Place 1 drop into both eyes 2 (two) times daily.       . traMADol (ULTRAM) 50 MG tablet Take 50 mg by mouth every 6 (six) hours as needed for moderate pain.         Insulin Requirements in the past 24 hours:  None, On SSI   Assessment: 66 YOM admitted with abdominal pain due to suspected bowel ischemia. Pt is malnourished due to inadequate oral intake prior to admission. Since admission patient has been eating 100% of meals. Pt is currently NPO.   GI: Watershed bowel ischemia s/p AAA repair.  Endo: No hx of DM, CBGs controlled  Lytes: K 3.3 (supplemented), Mg 1.7 (supplemented), Phos 4  Renal: SCr 3.7, UOP 0.5 mL/kg/hr, +9L fluid  Pulm: On ventilator. FiO2 50% Cards: H/o HTN, VSS  Hepatobil: transaminases elevated, Bilirubin wnl  ID: Intra-abdominal infx, WBC elevated at 18.6, On day #5 of abx  Best Practices: SCDs, Protonix IV  TPN  Access: CVC Rt internal Jugular  TPN day#: 0  Current Nutrition:  None    Nutritional Goals Per RD:  Kcal: 1850-2000  Protein: 100-110 gm   Plan:  -Start Clinimix E 5/15 at 40 mL/hr and IV fat emulsion at 10 mL/hr. This will provide 1162 kCal and 48 g protein per day. Titrate to goal as tolerated -Daily MVI and TE  -Order TPN labs  -Monitor patient closely for signs of re-feeding  -Titrate TPN carefully given than patient is already fluid overloaded.   Vinnie Level, PharmD.  Clinical Pharmacist Pager [phone]

## 2013-11-11 NOTE — Progress Notes (Signed)
   Daily Progress Note  Assessment/Planning: POD #5 s/p Valvuloplasty, EVAR   NEURO: sed for vent  PULM: per PCCM  CV: wean on vasopressor  GI: exam unchanged, at this point Gen Surg is concerned a negative lapartomy would kill the patient.  I agree that at this point, there is no clear advantage to exploring his abdomen  REN: UOP increasing, can stop bicarb drip  FEN: start TPN, +9 L fluid   HEME: likely some degree of hemodilution given patient +9 L positive  ID: continue empiric abx for a GI source  Subjective  - 5 Days Post-Op  Slow wean of vasopressors overnight  Objective Filed Vitals:   11/11/13 0800 11/11/13 0900 11/11/13 1000 11/11/13 1013  BP: 110/71 110/62 107/59 135/65  Pulse: 89 89 92 99  Temp:      TempSrc:      Resp: Height:      Weight:      SpO2: 95% 95% 95% 93%    Intake/Output Summary (Last 24 hours) at 11/11/13 1024 Last data filed at 11/11/13 1000  Gross per 24 hour  Intake 4214.07 ml  Output    960 ml  Net 3254.07 ml   NEURO Not oriented, sedated with 200 mcg/hr Fentanyl PULM  BLL rales Vent Mode:  [-] PRVC FiO2 (%):  [40 %-60 %] 60 % Set Rate:  [20 bmp-25 bmp] 20 bmp Vt Set:  [580 mL] 580 mL PEEP:  [5 cmH20] 5 cmH20 Plateau Pressure:  [17 cmH20-20 cmH20] 20 cmH20 CV  RRR, +M GI  soft, diffusely TTP, R>L TTP, mild distension, scotal swelling VASC  B groin incision c/d/i, dressings in place, Both feet viable  Laboratory CBC    Component Value Date/Time   WBC 18.6* 11/11/2013 0407   HGB 9.2* 11/11/2013 0844   HCT 27.0* 11/11/2013 0844   PLT 49* 11/11/2013 0407    BMET    Component Value Date/Time   NA 138 11/11/2013 0844   K 3.3* 11/11/2013 0844   CL 99 11/11/2013 0844   CO2 23 11/10/2013 2317   GLUCOSE 109* 11/11/2013 0844   BUN 43* 11/11/2013 0844   CREATININE 3.70* 11/11/2013 0844   CALCIUM 7.0* 11/10/2013 2317   GFRNONAA 16* 11/10/2013 2317   GFRAA 18* 11/10/2013 2317    [name], MD [facility] [place] Office: [phone] Pager: [phone]  11/11/2013, 10:24 AM

## 2013-11-12 ENCOUNTER — Inpatient Hospital Stay (HOSPITAL_COMMUNITY): Payer: Medicare Other

## 2013-11-12 LAB — GLUCOSE, CAPILLARY
GLUCOSE-CAPILLARY: 126 mg/dL — AB (ref 70–99)
GLUCOSE-CAPILLARY: 111 mg/dL — AB (ref 70–99)
Glucose-Capillary: 106 mg/dL — ABNORMAL HIGH (ref 70–99)
Glucose-Capillary: 109 mg/dL — ABNORMAL HIGH (ref 70–99)
Glucose-Capillary: 114 mg/dL — ABNORMAL HIGH (ref 70–99)
Glucose-Capillary: 117 mg/dL — ABNORMAL HIGH (ref 70–99)
Glucose-Capillary: 119 mg/dL — ABNORMAL HIGH (ref 70–99)

## 2013-11-12 LAB — COMPREHENSIVE METABOLIC PANEL
ALT: 193 U/L — ABNORMAL HIGH (ref 0–53)
ANION GAP: 13 (ref 5–15)
AST: 115 U/L — ABNORMAL HIGH (ref 0–37)
Albumin: 1.4 g/dL — ABNORMAL LOW (ref 3.5–5.2)
Alkaline Phosphatase: 96 U/L (ref 39–117)
BUN: 56 mg/dL — AB (ref 6–23)
CO2: 26 meq/L (ref 19–32)
CREATININE: 3.28 mg/dL — AB (ref 0.50–1.35)
Calcium: 7.7 mg/dL — ABNORMAL LOW (ref 8.4–10.5)
Chloride: 101 mEq/L (ref 96–112)
GFR calc Af Amer: 18 mL/min — ABNORMAL LOW (ref >90)
GFR, EST NON AFRICAN AMERICAN: 16 mL/min — AB (ref >90)
GLUCOSE: 120 mg/dL — AB (ref 70–99)
Potassium: 3.7 mEq/L (ref 3.7–5.3)
SODIUM: 140 meq/L (ref 137–147)
TOTAL PROTEIN: 4.5 g/dL — AB (ref 6.0–8.3)
Total Bilirubin: 2.7 mg/dL — ABNORMAL HIGH (ref 0.3–1.2)

## 2013-11-12 LAB — DIFFERENTIAL
Basophils Absolute: 0 10*3/uL (ref 0.0–0.1)
Basophils Relative: 0 % (ref 0–1)
EOS ABS: 0 10*3/uL (ref 0.0–0.7)
Eosinophils Relative: 0 % (ref 0–5)
LYMPHS PCT: 3 % — AB (ref 12–46)
Lymphs Abs: 0.7 10*3/uL (ref 0.7–4.0)
MONO ABS: 1.5 10*3/uL — AB (ref 0.1–1.0)
Monocytes Relative: 7 % (ref 3–12)
NEUTROS PCT: 90 % — AB (ref 43–77)
Neutro Abs: 19.9 10*3/uL — ABNORMAL HIGH (ref 1.7–7.7)

## 2013-11-12 LAB — POCT I-STAT, CHEM 8
BUN: 45 mg/dL — ABNORMAL HIGH (ref 6–23)
CREATININE: 3.3 mg/dL — AB (ref 0.50–1.35)
Calcium, Ion: 1.02 mmol/L — ABNORMAL LOW (ref 1.13–1.30)
Chloride: 105 mEq/L (ref 96–112)
GLUCOSE: 109 mg/dL — AB (ref 70–99)
HCT: 25 % — ABNORMAL LOW (ref 39.0–52.0)
Hemoglobin: 8.5 g/dL — ABNORMAL LOW (ref 13.0–17.0)
POTASSIUM: 3.2 meq/L — AB (ref 3.7–5.3)
SODIUM: 140 meq/L (ref 137–147)
TCO2: 23 mmol/L (ref 0–100)

## 2013-11-12 LAB — CBC
HCT: 25.5 % — ABNORMAL LOW (ref 39.0–52.0)
HEMOGLOBIN: 8.9 g/dL — AB (ref 13.0–17.0)
MCH: 31.1 pg (ref 26.0–34.0)
MCHC: 34.9 g/dL (ref 30.0–36.0)
MCV: 89.2 fL (ref 78.0–100.0)
Platelets: 59 10*3/uL — ABNORMAL LOW (ref 150–400)
RBC: 2.86 MIL/uL — ABNORMAL LOW (ref 4.22–5.81)
RDW: 16.2 % — AB (ref 11.5–15.5)
WBC: 22.1 10*3/uL — ABNORMAL HIGH (ref 4.0–10.5)

## 2013-11-12 LAB — PREALBUMIN: PREALBUMIN: 5.9 mg/dL — AB (ref 17.0–34.0)

## 2013-11-12 LAB — TRIGLYCERIDES: Triglycerides: 122 mg/dL (ref <150)

## 2013-11-12 LAB — MAGNESIUM: MAGNESIUM: 1.9 mg/dL (ref 1.5–2.5)

## 2013-11-12 LAB — PHOSPHORUS: PHOSPHORUS: 4.3 mg/dL (ref 2.3–4.6)

## 2013-11-12 MED ORDER — FAT EMULSION 20 % IV EMUL
250.0000 mL | INTRAVENOUS | Status: AC
  Administered 2013-11-12: 250 mL via INTRAVENOUS
  Filled 2013-11-12: qty 250

## 2013-11-12 MED ORDER — CLINIMIX E/DEXTROSE (5/15) 5 % IV SOLN
INTRAVENOUS | Status: AC
  Administered 2013-11-12: 17:00:00 via INTRAVENOUS
  Filled 2013-11-12: qty 2000

## 2013-11-12 MED ORDER — POTASSIUM CHLORIDE 10 MEQ/50ML IV SOLN
10.0000 meq | INTRAVENOUS | Status: AC
  Administered 2013-11-12 (×2): 10 meq via INTRAVENOUS

## 2013-11-12 MED ORDER — POTASSIUM CHLORIDE 10 MEQ/50ML IV SOLN
INTRAVENOUS | Status: AC
  Filled 2013-11-12: qty 50

## 2013-11-12 NOTE — Progress Notes (Signed)
NUTRITION FOLLOW UP  DOCUMENTATION CODES  Per approved criteria  -Severe malnutrition in the context of acute illness or injury   Intervention:   - TPN per pharmacy - RD will continue to follow for nutrition care plan  Nutrition Dx:   Malnutrition related to inadequate oral intake as evidenced by moderate depletion of muscle mass and 7% weight loss in 1 month, ongoing  Goal:   Pt to meet >/= 90% of their estimated nutrition needs   Monitor:   I/O's, TPN, vent status/settings, labs, weight trend  Assessment:   80 yo patient sent to hospital by Dr. Shana Chute for murmur and palpable abdominal mass. Patient found to have 8cm AAA  9/1: Patient reports that he has lost weight over the past month due to poor appetite with abdominal pain. Since admission, he has been eating 100% of meals.  Patient transferred to 2S-SICU from 2W-Cardiac post-op.   Patient s/p procedure 9/8:  INTRODUCTION OF LEFT CONTRALATERAL LIMB OF EVAR  LEFT COMMON FEMORAL ARTERY  9/14:  Current TPN is D 5/15 running at 40 ml/h and IV fat emulsion at 10 mL/hr. Provides 1162 kcal (59% of needs) and 48 g protein (48% of needs) per day.   Goal is 90 ml/hr with lipids at 10 ml/hr to provide 2014 kcal and 108 g protein. (100% of estimated needs)  Patient is currently intubated on ventilator support MV: 13.0 L/min Temp (24hrs), Avg:98.3 F (36.8 C), Min:97.4 F (36.3 C), Max:98.9 F (37.2 C)  Height: Ht Readings from Last 1 Encounters:  11/17/2013  (1.778 m)   Weight Status:   Wt Readings from Last 1 Encounters:  11/12/13 206 lb 2.1 oz (93.5 kg)   Re-estimated needs:  Kcal: 1850-2000 Protein: 100-110 Fluid: Per MD  Skin: closed incisions on groin  Diet Order: NPO   Intake/Output Summary (Last 24 hours) at 11/12/13 1222 Last data filed at 11/12/13 0700  Gross per 24 hour  Intake 1273.8 ml  Output   1105 ml  Net  168.8 ml   Last BM: none recorded since 9/04  Labs:   Recent Labs Lab  11/10/13 0355 11/10/13 2317 11/11/13 0407 11/11/13 0844 11/12/13 0340  NA 138 136*  --  138 140  K 3.8 3.1*  --  3.3* 3.7  CL 104 98  --  99 101  CO2 19 23  --   --  26  BUN 49* 52*  --  43* 56*  CREATININE 3.36* 3.30*  --  3.70* 3.28*  CALCIUM 7.0* 7.0*  --   --  7.7*  MG  --  1.7 1.7  --  1.9  PHOS  --   --  4.0  --  4.3  GLUCOSE 105* 101*  --  109* 120*    CBG (last 3)   Recent Labs  11/12/13 0054 11/12/13 0409 11/12/13 0753  GLUCAP 117* 109* 126*    Scheduled Meds: . antiseptic oral rinse  7 mL Mouth Rinse QID  . chlorhexidine  15 mL Mouth Rinse BID  . dorzolamide  1 drop Both Eyes BID  . fentaNYL  50 mcg Intravenous Once  . insulin aspart  0-9 Units Subcutaneous 6 times per day  . pantoprazole (PROTONIX) IV  40 mg Intravenous Q24H  . piperacillin-tazobactam (ZOSYN)  IV  2.25 g Intravenous 3 times per day    Continuous Infusions: . Marland KitchenTPN (CLINIMIX-E) Adult 40 mL/hr at 11/11/13 1824   And  . fat emulsion 250 mL (11/11/13 1823)  . Marland Kitchen  TPN (CLINIMIX-E) Adult     And  . fat emulsion    . fentaNYL infusion INTRAVENOUS 250 mcg/hr (11/12/13 0437)    Ebbie Latus RD, LDN

## 2013-11-12 NOTE — Progress Notes (Signed)
   Daily Progress Note  Assessment/Planning: POD #6 s/p Valvuloplasty, EVAR for large AAA, Abd pain of unknown etiology   Off vasopressors even with bicarbonate drip off  Improved UOP with decrease in CR  Vent/ETT mgmt per PCCM: report SBT today  May need some diurese to assist long-term extubation  KUB pending  Once pt fully awake and extubated will need to get a good neuro check  Abd issues unchanged: risk-benefit analysis continues to favor watchful waiting  Cont TPN  Subjective  - 6 Days Post-Op  No events overnight  Objective Filed Vitals:   11/12/13 0500 11/12/13 0600 11/12/13 0700 11/12/13 0756  BP: 115/61 122/67 125/77   Pulse: 88 90 90   Temp:    98 F (36.7 C)  TempSrc:    Axillary  Resp: Height:      Weight:      SpO2: 94% 95% 95%     Intake/Output Summary (Last 24 hours) at 11/12/13 0833 Last data filed at 11/12/13 0700  Gross per 24 hour  Intake 1702.7 ml  Output   1355 ml  Net  347.7 ml    PULM  BLL rales CV  RRR GI  soft, somewhat distended, TTP diffusely R>L VASC  R tibial dopplerable, L peroneal dopplerable, both feet appear viable  Laboratory CBC    Component Value Date/Time   WBC 22.1* 11/12/2013 0340   HGB 8.9* 11/12/2013 0340   HCT 25.5* 11/12/2013 0340   PLT 59* 11/12/2013 0340    BMET    Component Value Date/Time   NA 140 11/12/2013 0340   K 3.7 11/12/2013 0340   CL 101 11/12/2013 0340   CO2 26 11/12/2013 0340   GLUCOSE 120* 11/12/2013 0340   BUN 56* 11/12/2013 0340   CREATININE 3.28* 11/12/2013 0340   CALCIUM 7.7* 11/12/2013 0340   GFRNONAA 16* 11/12/2013 0340   GFRAA 18* 11/12/2013 0340    [name], MD [facility] [place] Office: [phone] Pager: [phone]  11/12/2013, 8:33 AM

## 2013-11-12 NOTE — Progress Notes (Signed)
Increased frequency of MFPVCs noted.  Stat Chem 8 done-K+3.2.  Results to Dr. Vassie Loll.  Orders for K+ replacement received.

## 2013-11-12 NOTE — Progress Notes (Signed)
PULMONARY / CRITICAL CARE MEDICINE   Name: Shawn Mathews MRN: [id number] DOB: 1928-01-22    ADMISSION DATE:  09/30/2013 CONSULTATION DATE:  9/8  REFERRING MD :  Bridgett Larsson  CHIEF COMPLAINT:  Post op vent management  INITIAL PRESENTATION: 80 y/o male admitted on 9/8 with abdominal pain felt to be due to AAA, found to have critical symptomatic aortic stenosis.  Underwent endovascular AAA repair on 9/8 with valvuloplasty so PCCM consulted for post op vent management.  STUDIES:  8/31 CT angio AB > AAA 8.5 cm, tortuos R iliac arterial system, occluded SFA, 17 mm renal mass left 9/4 CT angio heart/chest> emphysema, calcified mitral and aortic valves, calcified trileaflet aortic valve  SIGNIFICANT EVENTS: 9/8 Aortic valvuloplasty, bilat common fem artery cannulation, R iliofemoral endarterectomy, repair left common femoral artery, aortogram, repair of aorta with bifurcated prosthesis, aortic valvuloplasty 9/9- worsening shock, tachy   SUBJECTIVE:  afebrile Remains critically ill on vent, off Neo.   Improving UO over weekend  VITAL SIGNS: Temp:  [97.4 F (36.3 C)-98.9 F (37.2 C)] 98 F (36.7 C) (09/14 0756) Pulse Rate:  [86-99] 90 (09/14 0700) Resp:  [13-24] 18 (09/14 0700) BP: (90-142)/(53-77) 125/77 mmHg (09/14 0700) SpO2:  [93 %-96 %] 95 % (09/14 0700) Arterial Line BP: (100-158)/(56-76) 142/68 mmHg (09/14 0700) FiO2 (%):  [50 %-60 %] 60 % (09/14 0400) Weight:  [93.5 kg (206 lb 2.1 oz)] 93.5 kg (206 lb 2.1 oz) (09/14 0400) HEMODYNAMICS: CVP:  [14 mmHg] 14 mmHg VENTILATOR SETTINGS: Vent Mode:  [-] PRVC FiO2 (%):  [50 %-60 %] 60 % Set Rate:  [12 bmp-20 bmp] 12 bmp Vt Set:  [580 mL] 580 mL PEEP:  [5 cmH20] 5 cmH20 Plateau Pressure:  [17 cmH20-26 cmH20] 25 cmH20 INTAKE / OUTPUT:  Intake/Output Summary (Last 24 hours) at 11/12/13 0827 Last data filed at 11/12/13 0700  Gross per 24 hour  Intake 1702.7 ml  Output   1355 ml  Net  347.7 ml    PHYSICAL EXAMINATION: General:   Chronically ill appearing male, NAD on vent  HEENT: NCAT, PERRL, ETT in place PULM: resps even non labored on full support, coarse, diminished bases  CV: RRR, distant heart sounds, apex murmur AB: firm, tender diffusely,no BS, no rebound, guarding voluntary Ext: cool, 1+ edema palpable rt, doplerable left dp Neuro: awake, tracks, nods occasionally, RASS 0, not moving BLes  LABS:  CBC  Recent Labs Lab 11/10/13 0355 11/11/13 0407 11/11/13 0844 11/12/13 0340  WBC 16.5* 18.6*  --  22.1*  HGB 8.0* 8.2* 9.2* 8.9*  HCT 23.0* 23.0* 27.0* 25.5*  PLT 55* 49*  --  59*   Coag's  Recent Labs Lab 11/07/13 1100 11/08/13 0345 11/09/13 0800 11/10/13 0355  APTT 40* 54* 53*  --   INR 1.65* 2.08*  --  1.75*   BMET  Recent Labs Lab 11/10/13 0355 11/10/13 2317 11/11/13 0844 11/12/13 0340  NA 138 136* 138 140  K 3.8 3.1* 3.3* 3.7  CL 104 98 99 101  CO2 19 23  --  26  BUN 49* 52* 43* 56*  CREATININE 3.36* 3.30* 3.70* 3.28*  GLUCOSE 105* 101* 109* 120*   Electrolytes  Recent Labs Lab 11/10/13 0355 11/10/13 2317 11/11/13 0407 11/12/13 0340  CALCIUM 7.0* 7.0*  --  7.7*  MG  --  1.7 1.7 1.9  PHOS  --   --  4.0 4.3   Sepsis Markers  Recent Labs Lab 11/08/13 11/09/13 0950 11/10/13 0355  LATICACIDVEN 2.6* 3.3* 1.7  ABG  Recent Labs Lab 11/10/13 2325 11/11/13 0411 11/11/13 1319  PHART 7.522* 7.497* 7.439  PCO2ART 30.0* 34.7* 38.4  PO2ART 54.0* 57.0* 61.0*   Liver Enzymes  Recent Labs Lab 11/08/13 0345 11/12/13 0340  AST 111* 115*  ALT 59* 193*  ALKPHOS 44 96  BILITOT 1.2 2.7*  ALBUMIN 2.3* 1.4*   Cardiac Enzymes  Recent Labs Lab 11/20/2013 1614 11/13/2013 2315 11/07/13 0400  TROPONINI 2.29* 3.94* 5.91*   Glucose  Recent Labs Lab 11/11/13 0736 11/11/13 1136 11/11/13 1557 11/11/13 2003 11/12/13 0054 11/12/13 0409  GLUCAP 106* 84 84 111* 117* 109*    Imaging Dg Chest Portable 1 View  11/11/2013   CLINICAL DATA:  Respiratory failure.   EXAM: PORTABLE CHEST - 1 VIEW  COMPARISON:  Chest x-ray 11/10/2013.  FINDINGS: An endotracheal tube is in place with tip 3.4 cm above the carina. There is a right-sided internal jugular central venous catheter with tip terminating in the distal superior vena cava. A nasogastric tube is seen extending into the stomach, however, the tip of the nasogastric tube extends below the lower margin of the image. Lung volumes are low. Bibasilar opacities may reflect areas of atelectasis and/or consolidation (left greater than right). Small left pleural effusion. Crowding of the pulmonary vasculature, accentuated by low lung volumes, without frank pulmonary edema. Heart size is normal. Upper mediastinal contours are distorted by patient positioning. Atherosclerosis in the thoracic aorta.  IMPRESSION: 1. Support apparatus, as above. 2. Low lung volumes with bibasilar (left greater than right) atelectasis and/or consolidation, and small left pleural effusion.   Electronically Signed   By: Vinnie Langton M.D.   On: 11/11/2013 10:11     ASSESSMENT / PLAN:  PULMONARY OETT 9/8 >> A: COPD Acute resp failure Small resp alk P:   -SBTs -duoneb scheduled -f/u CXR in am  -keep RR low to avoid autoPEEP  CARDIOVASCULAR CVL R IJ introducer/RHC A: Shock stabilizing, r/o SIRS abdo ischemia Echo - nml LV fn P:  - avoid vaso with suspected bowel ischemia  RENAL A:  Acute on chronic renal failure, cr rising, hypoK P:   -bmet to daily -Off HCO3 gtt  -Hold off on lasix, CVP 12 range ok  GASTROINTESTINAL A: Watershed bowel  ischemia s/p AAA repair at risk IMA P:   -NPO -Surgery evaluating, but has improved clinically   HEMATOLOGIC A:  Minor bleeding from groin site in OR resolved, now s/p PRBC Thrombocytopenia consumptive likley Hgb, plt cont to trend down 9/12 Coagulopathy -likely vit K def,fibrinogen ok P:  -Transfusion goal < 7gm/dL  - f/u coags  - FFP to give per [facility] if bleeding  noted  INFECTIOUS A:  R/o  Bowel ischemia, r/o abdo sepsis P:    9/9 zosyn>>> 9/9 vanc>>>9/12 ct empiric abdo coverage x 7ds now that off pressors   ENDOCRINE A:  Mild hyperglycemia, now off insulin gtt but no history of DM2 -cortisol high, tsh ok P:   -monitor CBG  NEUROLOGIC A:  Pain control post surgery Sedation for vent synchrony required P:   RASS goal: 0 Fentanyl gtt Versed prn ok as BP permits  TODAY'S SUMMARY: Shock & lactate resolved.  He seems to have temporized but not dramatically better. Will start weaning efforts. Expect prolonged course.  I have personally obtained a history, examined the patient, evaluated laboratory and imaging results, formulated the assessment and plan and placed orders. CRITICAL CARE: The patient is critically ill with multiple organ systems failure and requires high complexity  decision making for assessment and support, frequent evaluation and titration of therapies, application of advanced monitoring technologies and extensive interpretation of multiple databases. Critical Care Time devoted to patient care services described in this note is 30  minutes.    Kara Mead MD. [place] Flood. [facility] Pager [phone] If no response call [phone]   11/12/2013  8:27 AM

## 2013-11-12 NOTE — Progress Notes (Signed)
ANTIBIOTIC CONSULT NOTE - FOLLOW UP  Pharmacy Consult for Zosyn Indication: intra-abdominal infection  Allergies  Allergen Reactions  . Penicillins     unknown    Patient Measurements: Height:  (177.8 cm) Weight: 206 lb 2.1 oz (93.5 kg) IBW/kg (Calculated) : 73  Vital Signs: Temp: 98.7 F (37.1 C) (09/14 1158) Temp src: Axillary (09/14 1158) BP: 136/74 mmHg (09/14 1200) Pulse Rate: 105 (09/14 1200) Intake/Output from previous day: 09/13 0701 - 09/14 0700 In: 1822.7 [I.V.:1022.7; IV Piggyback:250; TPN:550] Out: 1425 [Urine:1425] Intake/Output from this shift: Total I/O In: 415 [I.V.:125; TPN:290] Out: 300 [Urine:300]  Labs:  Recent Labs  11/10/13 0355 11/10/13 2317 11/11/13 0407 11/11/13 0844 11/12/13 0340  WBC 16.5*  --  18.6*  --  22.1*  HGB 8.0*  --  8.2* 9.2* 8.9*  PLT 55*  --  49*  --  59*  CREATININE 3.36* 3.30*  --  3.70* 3.28*   Estimated Creatinine Clearance: 19.3 ml/min (by C-G formula based on Cr of 3.28).  Recent Labs  11/09/13 1427  VANCORANDOM 12.2     Microbiology: Recent Results (from the past 720 hour(s))  SURGICAL PCR SCREEN     Status: None   Collection Time    10/30/13  7:52 AM      Result Value Ref Range Status   MRSA, PCR NEGATIVE  NEGATIVE Final   Staphylococcus aureus NEGATIVE  NEGATIVE Final   Comment:            The Xpert SA Assay (FDA     approved for NASAL specimens     in patients over 30 years of age),     is one component of     a comprehensive surveillance     program.  Test performance has     been validated by [employer] for patients greater     than or equal to 61 year old.     It is not intended     to diagnose infection nor to     guide or monitor treatment.  SURGICAL PCR SCREEN     Status: None   Collection Time    11/05/13 10:44 AM      Result Value Ref Range Status   MRSA, PCR NEGATIVE  NEGATIVE Final   Staphylococcus aureus NEGATIVE  NEGATIVE Final   Comment:            The Xpert SA  Assay (FDA     approved for NASAL specimens     in patients over 60 years of age),     is one component of     a comprehensive surveillance     program.  Test performance has     been validated by [employer] for patients greater     than or equal to 32 year old.     It is not intended     to diagnose infection nor to     guide or monitor treatment.    Anti-infectives   Start     Dose/Rate Route Frequency Ordered Stop   11/09/13 1600  vancomycin (VANCOCIN) IVPB 750 mg/150 ml premix  Status:  Discontinued     750 mg 150 mL/hr over 60 Minutes Intravenous Every 24 hours 11/09/13 1539 11/10/13 1201   11/08/13 2000  piperacillin-tazobactam (ZOSYN) IVPB 2.25 g     2.25 g 100 mL/hr over 30 Minutes Intravenous 3 times per day 11/08/13 1204  11/08/13 1500  vancomycin (VANCOCIN) IVPB 750 mg/150 ml premix  Status:  Discontinued     750 mg 150 mL/hr over 60 Minutes Intravenous Every 24 hours 11/08/13 1449 11/09/13 1404   11/07/13 1215  vancomycin (VANCOCIN) 250 mg in sodium chloride 0.9 % 100 mL IVPB     250 mg 100 mL/hr over 60 Minutes Intravenous  Once 11/07/13 1209 11/07/13 1559   11/07/13 1200  piperacillin-tazobactam (ZOSYN) IVPB 3.375 g  Status:  Discontinued     3.375 g 12.5 mL/hr over 240 Minutes Intravenous 3 times per day 11/07/13 1154 11/08/13 1204   11/07/13 1000  vancomycin (VANCOCIN) 500 mg in sodium chloride 0.9 % 100 mL IVPB  Status:  Discontinued     500 mg 100 mL/hr over 60 Minutes Intravenous Every 24 hours 11/07/13 0905 11/07/13 1208   11/11/2013 0615  levofloxacin (LEVAQUIN) IVPB 500 mg     500 mg 100 mL/hr over 60 Minutes Intravenous To Surgery 11/08/2013 0605 11/11/2013 0820   11/04/2013 0600  vancomycin (VANCOCIN) IVPB 1000 mg/200 mL premix  Status:  Discontinued     1,000 mg 200 mL/hr over 60 Minutes Intravenous On call to O.R. 11/05/13 0745 11/12/2013 1450   11/03/2013 0600  vancomycin (VANCOCIN) IVPB 1000 mg/200 mL premix  Status:  Discontinued     1,000 mg 200  mL/hr over 60 Minutes Intravenous To Surgery 10/30/13 0730 11/01/13 1740      Assessment: 80 year old male on Day #6 of Zosyn for intra-abdominal coverage.  His renal dysfunction persists and his regimen remains appropriate.  Plan:  Continue Zosyn 2.25gm IV q8h Follow renal function closely   Estella Husk, Pharm.D., BCPS, AAHIVP Clinical Pharmacist Phone: [phone] or [phone] 11/12/2013, 12:37 PM

## 2013-11-12 NOTE — Progress Notes (Signed)
Has peritoneal signs on exam, likely not full thickness as doesn't appear to have brbpr, risk of surgery due to comorbities high and now that going on for this period would agree with watchful waiting.  Outcome not likely good moving forward. Still at risk for having surgical emergency and would be appropriate to discuss status with family in case of emergency

## 2013-11-12 NOTE — Progress Notes (Addendum)
PARENTERAL NUTRITION CONSULT NOTE - INITIAL  Pharmacy Consult for TNA  Indication: Possible bowel ischemia   Allergies  Allergen Reactions  . Penicillins     unknown    Patient Measurements: Height:  (177.8 cm) Weight: 206 lb 2.1 oz (93.5 kg) IBW/kg (Calculated) : 73  Vital Signs: Temp: 97.4 F (36.3 C) (09/14 0400) Temp src: Oral (09/14 0400) BP: 122/67 mmHg (09/14 0600) Pulse Rate: 90 (09/14 0600) Intake/Output from previous day: 09/13 0701 - 09/14 0700 In: 1747.7 [I.V.:987.7; IV Piggyback:250; TPN:510] Out: 1425 [Urine:1425] Intake/Output from this shift:    Labs:  Recent Labs  11/09/13 0800  11/10/13 0355 11/11/13 0407 11/11/13 0844 11/12/13 0340  WBC  --   --  16.5* 18.6*  --  22.1*  HGB  --   < > 8.0* 8.2* 9.2* 8.9*  HCT  --   < > 23.0* 23.0* 27.0* 25.5*  PLT  --   --  55* 49*  --  59*  APTT 53*  --   --   --   --   --   INR  --   --  1.75*  --   --   --   < > = values in this interval not displayed.   Recent Labs  11/10/13 0355 11/10/13 2317 11/11/13 0407 11/11/13 0844 11/12/13 0340  NA 138 136*  --  138 140  K 3.8 3.1*  --  3.3* 3.7  CL 104 98  --  99 101  CO2 19 23  --   --  26  GLUCOSE 105* 101*  --  109* 120*  BUN 49* 52*  --  43* 56*  CREATININE 3.36* 3.30*  --  3.70* 3.28*  CALCIUM 7.0* 7.0*  --   --  7.7*  MG  --  1.7 1.7  --  1.9  PHOS  --   --  4.0  --  4.3  PROT  --   --   --   --  4.5*  ALBUMIN  --   --   --   --  1.4*  AST  --   --   --   --  115*  ALT  --   --   --   --  193*  ALKPHOS  --   --   --   --  96  BILITOT  --   --   --   --  2.7*  TRIG  --   --   --   --  122   Estimated Creatinine Clearance: 19.3 ml/min (by C-G formula based on Cr of 3.28).    Recent Labs  11/11/13 2003 11/12/13 0054 11/12/13 0409  GLUCAP 111* 117* 109*    Medical History: Past Medical History  Diagnosis Date  . Hypertension   . Gout   . Glaucoma   . COPD (chronic obstructive pulmonary disease)     Medications:   Prescriptions prior to admission  Medication Sig Dispense Refill  . allopurinol (ZYLOPRIM) 100 MG tablet Take by mouth daily.      Marland Kitchen amLODipine (NORVASC) 5 MG tablet Take 5 mg by mouth daily.      . dorzolamide (TRUSOPT) 2 % ophthalmic solution Place 1 drop into both eyes 2 (two) times daily.       . traMADol (ULTRAM) 50 MG tablet Take 50 mg by mouth every 6 (six) hours as needed for moderate pain.         Insulin Requirements in  the past 24 hours:  None, On SSI   Assessment: 54 YOM admitted with abdominal pain due to suspected bowel ischemia. Pt is malnourished due to inadequate oral intake prior to admission. Since admission patient has been eating 100% of meals. Pt is currently NPO.   GI: Watershed bowel ischemia s/p AAA repair.  Endo: No hx of DM, CBGs controlled  Lytes: K 3.7, Mg 1.9, Phos 4.3, Co Ca 9.78  Renal: SCr 3.28, UOP 0.6 mL/kg/hr, +3L yesterday Pulm: On ventilator. FiO2 60% Cards: H/o HTN, VSS  Hepatobil: transaminases elevated, t bili 2.7  TG 122 ID: Intra-abdominal infx, WBC elevated at 22.1, On day #6 of abx  Best Practices: SCDs, Protonix IV  TPN Access: CVC Rt internal Jugular  TPN day#: 1  Current Nutrition:  Clinimix E 5/15 at 40 ml/hr, lipids stopped earlier due to leaking Goal rate 90 ml/hr  Nutritional Goals Per RD:  Kcal: 1850-2000  Protein: 100-110 gm  Plan:  -Increase Clinimix E 5/15 to 50 mL/hr and cont IV fat emulsion at 10 mL/hr.  -Daily MVI and change TE to MWF -Monitor patient closely for signs of re-feeding  -Titrate TPN carefully given than patient is already fluid overloaded.  -f/u am labs  Talbert Cage, PharmD.  Clinical Pharmacist Pager [phone]

## 2013-11-12 NOTE — Progress Notes (Signed)
[facility] Progress Note  6 Days Post-Op  Subjective: Critically ill on vent, sedated, off pressors.  Opens eyes.  Minimally follows commands - moves left toes only.  Appears to be in significant pain.    Objective: Vital signs in last 24 hours: Temp:  [97.4 F (36.3 C)-98.9 F (37.2 C)] 97.4 F (36.3 C) (09/14 0400) Pulse Rate:  [86-99] 90 (09/14 0700) Resp:  [13-24] 18 (09/14 0700) BP: (90-142)/(53-77) 125/77 mmHg (09/14 0700) SpO2:  [93 %-96 %] 95 % (09/14 0700) Arterial Line BP: (100-158)/(56-76) 142/68 mmHg (09/14 0700) FiO2 (%):  [50 %-60 %] 60 % (09/14 0400) Weight:  [206 lb 2.1 oz (93.5 kg)] 206 lb 2.1 oz (93.5 kg) (09/14 0400) Last BM Date: 2013-11-09  Intake/Output from previous day: 09/13 0701 - 09/14 0700 In: 1822.7 [I.V.:1022.7; IV Piggyback:250; TPN:550] Out: 1425 [Urine:1425] Intake/Output this shift:    PE: Gen:  Alert, NAD, pleasant Card:  RRR, no M/G/R heard Pulm:  CTA, no W/R/R Abd: Hard/firm, distended, tender throughout, no BS heard, not able to evaluate HSM Ext:  Edema to all 4 extremities, distal pulses intact b/l  Lab Results:   Recent Labs  11/11/13 0407 11/11/13 0844 11/12/13 0340  WBC 18.6*  --  22.1*  HGB 8.2* 9.2* 8.9*  HCT 23.0* 27.0* 25.5*  PLT 49*  --  59*   BMET  Recent Labs  11/10/13 2317 11/11/13 0844 11/12/13 0340  NA 136* 138 140  K 3.1* 3.3* 3.7  CL 98 99 101  CO2 23  --  26  GLUCOSE 101* 109* 120*  BUN 52* 43* 56*  CREATININE 3.30* 3.70* 3.28*  CALCIUM 7.0*  --  7.7*   PT/INR  Recent Labs  11/10/13 0355  LABPROT 20.4*  INR 1.75*   CMP     Component Value Date/Time   NA 140 11/12/2013 0340   K 3.7 11/12/2013 0340   CL 101 11/12/2013 0340   CO2 26 11/12/2013 0340   GLUCOSE 120* 11/12/2013 0340   BUN 56* 11/12/2013 0340   CREATININE 3.28* 11/12/2013 0340   CALCIUM 7.7* 11/12/2013 0340   PROT 4.5* 11/12/2013 0340   ALBUMIN 1.4* 11/12/2013 0340   AST 115* 11/12/2013 0340   ALT 193* 11/12/2013 0340    ALKPHOS 96 11/12/2013 0340   BILITOT 2.7* 11/12/2013 0340   GFRNONAA 16* 11/12/2013 0340   GFRAA 18* 11/12/2013 0340   Lipase     Component Value Date/Time   LIPASE 33 11/08/2013 0345       Studies/Results: Dg Chest Portable 1 View  11/11/2013   CLINICAL DATA:  Respiratory failure.  EXAM: PORTABLE CHEST - 1 VIEW  COMPARISON:  Chest x-ray 11/10/2013.  FINDINGS: An endotracheal tube is in place with tip 3.4 cm above the carina. There is a right-sided internal jugular central venous catheter with tip terminating in the distal superior vena cava. A nasogastric tube is seen extending into the stomach, however, the tip of the nasogastric tube extends below the lower margin of the image. Lung volumes are low. Bibasilar opacities may reflect areas of atelectasis and/or consolidation (left greater than right). Small left pleural effusion. Crowding of the pulmonary vasculature, accentuated by low lung volumes, without frank pulmonary edema. Heart size is normal. Upper mediastinal contours are distorted by patient positioning. Atherosclerosis in the thoracic aorta.  IMPRESSION: 1. Support apparatus, as above. 2. Low lung volumes with bibasilar (left greater than right) atelectasis and/or consolidation, and small left pleural effusion.   Electronically Signed  By: Trudie Reed M.D.   On: 11/11/2013 10:11    Anti-infectives: Anti-infectives   Start     Dose/Rate Route Frequency Ordered Stop   11/09/13 1600  vancomycin (VANCOCIN) IVPB 750 mg/150 ml premix  Status:  Discontinued     750 mg 150 mL/hr over 60 Minutes Intravenous Every 24 hours 11/09/13 1539 11/10/13 1201   11/08/13 2000  piperacillin-tazobactam (ZOSYN) IVPB 2.25 g     2.25 g 100 mL/hr over 30 Minutes Intravenous 3 times per day 11/08/13 1204     11/08/13 1500  vancomycin (VANCOCIN) IVPB 750 mg/150 ml premix  Status:  Discontinued     750 mg 150 mL/hr over 60 Minutes Intravenous Every 24 hours 11/08/13 1449 11/09/13 1404   11/07/13  1215  vancomycin (VANCOCIN) 250 mg in sodium chloride 0.9 % 100 mL IVPB     250 mg 100 mL/hr over 60 Minutes Intravenous  Once 11/07/13 1209 11/07/13 1559   11/07/13 1200  piperacillin-tazobactam (ZOSYN) IVPB 3.375 g  Status:  Discontinued     3.375 g 12.5 mL/hr over 240 Minutes Intravenous 3 times per day 11/07/13 1154 11/08/13 1204   11/07/13 1000  vancomycin (VANCOCIN) 500 mg in sodium chloride 0.9 % 100 mL IVPB  Status:  Discontinued     500 mg 100 mL/hr over 60 Minutes Intravenous Every 24 hours 11/07/13 0905 11/07/13 1208   11/20/2013 0615  levofloxacin (LEVAQUIN) IVPB 500 mg     500 mg 100 mL/hr over 60 Minutes Intravenous To Surgery 11/15/2013 0605 11/27/2013 0820   11/16/2013 0600  vancomycin (VANCOCIN) IVPB 1000 mg/200 mL premix  Status:  Discontinued     1,000 mg 200 mL/hr over 60 Minutes Intravenous On call to O.R. 11/05/13 0745 11/05/2013 1450   10/30/2013 0600  vancomycin (VANCOCIN) IVPB 1000 mg/200 mL premix  Status:  Discontinued     1,000 mg 200 mL/hr over 60 Minutes Intravenous To Surgery 10/30/13 0730 11/01/13 1740       Assessment/Plan Ischemic bowel s/p AAA repair 8/31 CT angio AB > AAA 8.5 cm, tortuos R iliac arterial system, occluded SFA, 17 mm renal mass left  9/4 CT angio heart/chest> emphysema, calcified mitral and aortic valves, calcified trileaflet aortic valve 9/8 Aortic valvuloplasty, bilat common fem artery cannulation, R iliofemoral endarterectomy, repair left common femoral artery, aortogram, repair of aorta with bifurcated prosthesis Acute resp failure Acute on chronic renal failure Liver injury/failure Leukocytosis   Plan: 1.  Continue vanc and zosyn 2.  High risk of surgery because of aortic valve 3.  Continue to treat conservatively, appears some what worse today (more edematous, multi system organ injury), appears in pain, surgery may not be the best option at this point in time 4.  Weaned off pressors, wean vent as tolerated 5.  TPN, NPO, pain  control 6.  Further recommendations per Dr. Dwain Sarna     LOS: 14 days    DORT, [facility] 11/12/2013, 7:31 AM Pager: [phone]

## 2013-11-12 NOTE — Progress Notes (Signed)
Upon entering pt room a pool of white fluid was noted on the floor under the IV pole.  After further inspection it was noted that the Lipid IV was leaking from the hub where the tubing was spiked.  Called pharmacy and spoke with Barkley Bruns, Pharmacist and she advised to stop Lipids and discard.  Will stop lipids at this time.  Will continue to monitor.

## 2013-11-13 ENCOUNTER — Encounter (HOSPITAL_COMMUNITY): Payer: Self-pay | Admitting: Vascular Surgery

## 2013-11-13 ENCOUNTER — Inpatient Hospital Stay (HOSPITAL_COMMUNITY): Payer: Medicare Other

## 2013-11-13 LAB — GLUCOSE, CAPILLARY
GLUCOSE-CAPILLARY: 120 mg/dL — AB (ref 70–99)
GLUCOSE-CAPILLARY: 123 mg/dL — AB (ref 70–99)
GLUCOSE-CAPILLARY: 119 mg/dL — AB (ref 70–99)
GLUCOSE-CAPILLARY: 122 mg/dL — AB (ref 70–99)
Glucose-Capillary: 121 mg/dL — ABNORMAL HIGH (ref 70–99)
Glucose-Capillary: 122 mg/dL — ABNORMAL HIGH (ref 70–99)

## 2013-11-13 LAB — CBC
HCT: 26.7 % — ABNORMAL LOW (ref 39.0–52.0)
HEMOGLOBIN: 9 g/dL — AB (ref 13.0–17.0)
MCH: 30.5 pg (ref 26.0–34.0)
MCHC: 33.7 g/dL (ref 30.0–36.0)
MCV: 90.5 fL (ref 78.0–100.0)
PLATELETS: 76 10*3/uL — AB (ref 150–400)
RBC: 2.95 MIL/uL — AB (ref 4.22–5.81)
RDW: 16.4 % — ABNORMAL HIGH (ref 11.5–15.5)
WBC: 24.1 10*3/uL — ABNORMAL HIGH (ref 4.0–10.5)

## 2013-11-13 LAB — BASIC METABOLIC PANEL
ANION GAP: 12 (ref 5–15)
BUN: 62 mg/dL — ABNORMAL HIGH (ref 6–23)
CO2: 26 meq/L (ref 19–32)
Calcium: 7.7 mg/dL — ABNORMAL LOW (ref 8.4–10.5)
Chloride: 103 mEq/L (ref 96–112)
Creatinine, Ser: 3.19 mg/dL — ABNORMAL HIGH (ref 0.50–1.35)
GFR calc Af Amer: 19 mL/min — ABNORMAL LOW (ref >90)
GFR calc non Af Amer: 16 mL/min — ABNORMAL LOW (ref >90)
Glucose, Bld: 131 mg/dL — ABNORMAL HIGH (ref 70–99)
POTASSIUM: 3.6 meq/L — AB (ref 3.7–5.3)
SODIUM: 141 meq/L (ref 137–147)

## 2013-11-13 MED ORDER — FUROSEMIDE 10 MG/ML IJ SOLN
80.0000 mg | Freq: Once | INTRAMUSCULAR | Status: AC
  Administered 2013-11-13: 80 mg via INTRAVENOUS
  Filled 2013-11-13: qty 8

## 2013-11-13 MED ORDER — IPRATROPIUM-ALBUTEROL 0.5-2.5 (3) MG/3ML IN SOLN
RESPIRATORY_TRACT | Status: AC
  Filled 2013-11-13: qty 3

## 2013-11-13 MED ORDER — DEXMEDETOMIDINE HCL IN NACL 200 MCG/50ML IV SOLN
0.4000 ug/kg/h | INTRAVENOUS | Status: DC
  Administered 2013-11-13: 0.4 ug/kg/h via INTRAVENOUS
  Administered 2013-11-13: 0.1 ug/kg/h via INTRAVENOUS
  Filled 2013-11-13 (×2): qty 50

## 2013-11-13 MED ORDER — POTASSIUM CHLORIDE 10 MEQ/50ML IV SOLN
10.0000 meq | INTRAVENOUS | Status: AC
  Administered 2013-11-13 (×3): 10 meq via INTRAVENOUS
  Filled 2013-11-13 (×3): qty 50

## 2013-11-13 MED ORDER — M.V.I. ADULT IV INJ
INJECTION | INTRAVENOUS | Status: AC
  Administered 2013-11-13: 17:00:00 via INTRAVENOUS
  Filled 2013-11-13: qty 2000

## 2013-11-13 MED ORDER — FAT EMULSION 20 % IV EMUL
250.0000 mL | INTRAVENOUS | Status: AC
  Administered 2013-11-13: 250 mL via INTRAVENOUS
  Filled 2013-11-13: qty 250

## 2013-11-13 MED ORDER — IPRATROPIUM-ALBUTEROL 0.5-2.5 (3) MG/3ML IN SOLN
3.0000 mL | Freq: Four times a day (QID) | RESPIRATORY_TRACT | Status: DC
  Administered 2013-11-13 – 2013-11-21 (×32): 3 mL via RESPIRATORY_TRACT
  Filled 2013-11-13 (×31): qty 3

## 2013-11-13 MED ORDER — ALBUTEROL SULFATE (2.5 MG/3ML) 0.083% IN NEBU
2.5000 mg | INHALATION_SOLUTION | RESPIRATORY_TRACT | Status: DC | PRN
Start: 2013-11-13 — End: 2013-11-21

## 2013-11-13 NOTE — Progress Notes (Signed)
[facility] Progress Note  7 Days Post-Op  Subjective: Nurses note he's about the same.  Staying off pressors.  Still on vent.  Edematous extremities and scrotum.  Abdomen still distended and hard.  He appears uncomfortable.  Objective: Vital signs in last 24 hours: Temp:  [98 F (36.7 C)-98.7 F (37.1 C)] 98.3 F (36.8 C) (09/15 0728) Pulse Rate:  [84-105] 85 (09/15 0700) Resp:  [16-28] 19 (09/15 0700) BP: (99-175)/(58-82) 132/67 mmHg (09/15 0700) SpO2:  [94 %-96 %] 95 % (09/15 0700) Arterial Line BP: (102-174)/(51-80) 140/61 mmHg (09/15 0700) FiO2 (%):  [50 %-60 %] 50 % (09/15 0700) Weight:  [209 lb 3.5 oz (94.9 kg)] 209 lb 3.5 oz (94.9 kg) (09/15 0600) Last BM Date: 11/05/2013  Intake/Output from previous day: 09/14 0701 - 09/15 0700 In: 2203.2 [I.V.:705; NG/GT:30; IV Piggyback:150; TPN:1318.2] Out: 1615 [Urine:1565; Emesis/NG output:50] Intake/Output this shift:    PE: Gen: Alert, NAD, pleasant  Card: RRR, no M/G/R heard  Pulm: CTA, no W/R/R  Abd: Hard/firm, distended, tender throughout, no BS heard, not able to evaluate HSM, significant scrotal edema Ext: Edema to all 4 extremities   Lab Results:   Recent Labs  11/11/13 0407  11/12/13 0340 11/12/13 1249  WBC 18.6*  --  22.1*  --   HGB 8.2*  < > 8.9* 8.5*  HCT 23.0*  < > 25.5* 25.0*  PLT 49*  --  59*  --   < > = values in this interval not displayed. BMET  Recent Labs  11/12/13 0340 11/12/13 1249 11/13/13 0420  NA 140 140 141  K 3.7 3.2* 3.6*  CL 101 105 103  CO2 26  --  26  GLUCOSE 120* 109* 131*  BUN 56* 45* 62*  CREATININE 3.28* 3.30* 3.19*  CALCIUM 7.7*  --  7.7*   PT/INR No results found for this basename: LABPROT, INR,  in the last 72 hours CMP     Component Value Date/Time   NA 141 11/13/2013 0420   K 3.6* 11/13/2013 0420   CL 103 11/13/2013 0420   CO2 26 11/13/2013 0420   GLUCOSE 131* 11/13/2013 0420   BUN 62* 11/13/2013 0420   CREATININE 3.19* 11/13/2013 0420   CALCIUM 7.7*  11/13/2013 0420   PROT 4.5* 11/12/2013 0340   ALBUMIN 1.4* 11/12/2013 0340   AST 115* 11/12/2013 0340   ALT 193* 11/12/2013 0340   ALKPHOS 96 11/12/2013 0340   BILITOT 2.7* 11/12/2013 0340   GFRNONAA 16* 11/13/2013 0420   GFRAA 19* 11/13/2013 0420   Lipase     Component Value Date/Time   LIPASE 33 11/08/2013 0345       Studies/Results: Dg Chest Portable 1 View  11/11/2013   CLINICAL DATA:  Respiratory failure.  EXAM: PORTABLE CHEST - 1 VIEW  COMPARISON:  Chest x-ray 11/10/2013.  FINDINGS: An endotracheal tube is in place with tip 3.4 cm above the carina. There is a right-sided internal jugular central venous catheter with tip terminating in the distal superior vena cava. A nasogastric tube is seen extending into the stomach, however, the tip of the nasogastric tube extends below the lower margin of the image. Lung volumes are low. Bibasilar opacities may reflect areas of atelectasis and/or consolidation (left greater than right). Small left pleural effusion. Crowding of the pulmonary vasculature, accentuated by low lung volumes, without frank pulmonary edema. Heart size is normal. Upper mediastinal contours are distorted by patient positioning. Atherosclerosis in the thoracic aorta.  IMPRESSION: 1. Support apparatus, as  above. 2. Low lung volumes with bibasilar (left greater than right) atelectasis and/or consolidation, and small left pleural effusion.   Electronically Signed   By: Trudie Reed M.D.   On: 11/11/2013 10:11   Dg Abd Portable 1v  11/12/2013   CLINICAL DATA:  Abdominal aortic aneurysm endovascular stent graft repair.  EXAM: PORTABLE ABDOMEN - 1 VIEW  COMPARISON:  11/09/2013  FINDINGS: Calcification along the large native aneurysm surrounds the aortic stent graft, unchanged from the prior study.  Normal bowel gas pattern. No evidence of obstruction or generalized adynamic ileus.  Soft tissues are unremarkable.  IMPRESSION: 1. No acute findings.  No change the prior study.    Electronically Signed   By: Amie Portland M.D.   On: 11/12/2013 08:16    Anti-infectives: Anti-infectives   Start     Dose/Rate Route Frequency Ordered Stop   11/09/13 1600  vancomycin (VANCOCIN) IVPB 750 mg/150 ml premix  Status:  Discontinued     750 mg 150 mL/hr over 60 Minutes Intravenous Every 24 hours 11/09/13 1539 11/10/13 1201   11/08/13 2000  piperacillin-tazobactam (ZOSYN) IVPB 2.25 g     2.25 g 100 mL/hr over 30 Minutes Intravenous 3 times per day 11/08/13 1204     11/08/13 1500  vancomycin (VANCOCIN) IVPB 750 mg/150 ml premix  Status:  Discontinued     750 mg 150 mL/hr over 60 Minutes Intravenous Every 24 hours 11/08/13 1449 11/09/13 1404   11/07/13 1215  vancomycin (VANCOCIN) 250 mg in sodium chloride 0.9 % 100 mL IVPB     250 mg 100 mL/hr over 60 Minutes Intravenous  Once 11/07/13 1209 11/07/13 1559   11/07/13 1200  piperacillin-tazobactam (ZOSYN) IVPB 3.375 g  Status:  Discontinued     3.375 g 12.5 mL/hr over 240 Minutes Intravenous 3 times per day 11/07/13 1154 11/08/13 1204   11/07/13 1000  vancomycin (VANCOCIN) 500 mg in sodium chloride 0.9 % 100 mL IVPB  Status:  Discontinued     500 mg 100 mL/hr over 60 Minutes Intravenous Every 24 hours 11/07/13 0905 11/07/13 1208   11/27/2013 0615  levofloxacin (LEVAQUIN) IVPB 500 mg     500 mg 100 mL/hr over 60 Minutes Intravenous To Surgery 11/25/2013 0605 11/10/2013 0820   11/20/2013 0600  vancomycin (VANCOCIN) IVPB 1000 mg/200 mL premix  Status:  Discontinued     1,000 mg 200 mL/hr over 60 Minutes Intravenous On call to O.R. 11/05/13 0745 11/18/2013 1450   11-19-13 0600  vancomycin (VANCOCIN) IVPB 1000 mg/200 mL premix  Status:  Discontinued     1,000 mg 200 mL/hr over 60 Minutes Intravenous To Surgery 10/30/13 0730 11/01/13 1740       Assessment/Plan Ischemic bowel s/p AAA repair  8/31 CT angio AB > AAA 8.5 cm, tortuos R iliac arterial system, occluded SFA, 17 mm renal mass left  9/4 CT angio heart/chest> emphysema, calcified  mitral and aortic valves, calcified trileaflet aortic valve  9/8 Aortic valvuloplasty, bilat common fem artery cannulation, R iliofemoral endarterectomy, repair left common femoral artery, aortogram, repair of aorta with bifurcated prosthesis  Acute resp failure  Acute on chronic renal failure  Liver injury/failure  Leukocytosis   Plan:  1. Continue zosyn #7 and vanc stopped but had 4 days, repeat CBC today and tomorrow 2. High risk of surgery because of aortic valve  3. Continue to treat conservatively, appears about the same today (edematous, multi system organ injury), appears in pain, surgery may not be the best option at this  point in time  4. Weaned off pressors, wean vent as tolerated  5. TPN, NPO, pain control  6. Further recommendations per Dr. Dwain Sarna - watchful waiting, outcome not likely good, still at risk for having surgical emergency.  Would need to discuss with family their wishes if that situation arose.  I have asked the nurse to call me when the family arrives so Dr. Dwain Sarna can discuss the options with them.     LOS: 15 days    DORT, Aundra Millet 11/13/2013, 7:40 AM Pager: [phone]

## 2013-11-13 NOTE — Progress Notes (Signed)
Agree with above, family needs to have discussion about surgical emergency (ie perforation) and what they would want to do in this situation.  I think he has some mesenteric ischemia that is present since last week. I think laparotomy would result in death but current situation may also result in same. I think reasonable to treat conservatively understanding this.

## 2013-11-13 NOTE — Progress Notes (Addendum)
PULMONARY / CRITICAL CARE MEDICINE   Name: Shawn Mathews MRN: [id number] DOB: [birth year]    ADMISSION DATE:  10/16/2013 CONSULTATION DATE:  9/8  REFERRING MD :  Imogene Burn  CHIEF COMPLAINT:  Post op vent management  INITIAL PRESENTATION: 80 y/o male admitted on 9/8 with abdominal pain felt to be due to AAA, found to have critical symptomatic aortic stenosis. Underwent endovascular AAA repair on 9/8 with valvuloplasty so PCCM consulted for post op vent management.  STUDIES:  8/31 CT angio AB > AAA 8.5 cm, tortuous R iliac arterial system, occluded SFA, 17 mm renal mass left 9/4 CT angio heart/chest> emphysema, calcified mitral and aortic valves, calcified trileaflet aortic valve  SIGNIFICANT EVENTS: 9/8 Aortic valvuloplasty, bilat common fem artery cannulation, R iliofemoral endarterectomy, repair left common femoral artery, aortogram, repair of aorta with bifurcated prosthesis, aortic valvuloplasty 9/9- worsening shock, tachy   SUBJECTIVE:  RASS -1. + F/C. Failed SBT  VITAL SIGNS: Temp:  [98.1 F (36.7 C)-98.6 F (37 C)] 98.1 F (36.7 C) (09/15 1228) Pulse Rate:  [84-99] 85 (09/15 1300) Resp:  [16-28] 21 (09/15 1300) BP: (84-175)/(56-82) 108/65 mmHg (09/15 1300) SpO2:  [94 %-97 %] 96 % (09/15 1300) Arterial Line BP: (98-174)/(51-80) 127/58 mmHg (09/15 1300) FiO2 (%):  [50 %] 50 % (09/15 1300) Weight:  [94.9 kg (209 lb 3.5 oz)] 94.9 kg (209 lb 3.5 oz) (09/15 0600) HEMODYNAMICS: CVP:  [14 mmHg] 14 mmHg VENTILATOR SETTINGS: Vent Mode:  [-] PRVC FiO2 (%):  [50 %] 50 % Set Rate:  [12 bmp] 12 bmp Vt Set:  [580 mL] 580 mL PEEP:  [5 cmH20] 5 cmH20 Plateau Pressure:  [17 cmH20-18 cmH20] 18 cmH20 INTAKE / OUTPUT:  Intake/Output Summary (Last 24 hours) at 11/13/13 1414 Last data filed at 11/13/13 1300  Gross per 24 hour  Intake 2396.56 ml  Output   1765 ml  Net 631.56 ml    PHYSICAL EXAMINATION: General:  NAD HEENT: WNL PULM:  + exp wheezes CV: RRR, no M AB: mildly distended,  +BS Ext: symmetric edema   LABS: I have reviewed all of today's lab results. Relevant abnormalities are discussed in the A/P section  CXR: edema pattern  ASSESSMENT / PLAN:  PULMONARY OETT 9/8 >>  A:  Prolonged VDRF  COPD Acute bronchospasm P:   Cont full vent support - settings reviewed and/or adjusted Cont vent bundle Daily SBT if/when meets criteria  CARDIOVASCULAR A:  Shock, resolved P:  Monitor CVP goal 10-14 MAP goal > 65 mmHg  RENAL A:  AKI, Cr improving CKD Hypervolemia P:   Monitor BMET intermittently Monitor I/Os Correct electrolytes as indicated Lasix X 1 9/15  GASTROINTESTINAL A:  Bowel  ischemia s/p AAA repair P:   SUP: IV PPI Cont TPN  HEMATOLOGIC A:   Anemia, no acute blood loss presently Thrombocytopenia P:  DVT px: SCDs Monitor CBC intermittently Transfuse per usual ICU guidelines  INFECTIOUS A:   SIRS without definitive source of infection P:    9/9 zosyn >>  9/9 vanc >> 9/12  Complete 7 days pip-tazo Check PCT in AM 9/16   ENDOCRINE A:   Hyperglycemia without prior hx of DM  P:   Cont SSI  NEUROLOGIC A:   ICU associated discomfort/agitation P:   RASS goal: -1 Cont Fentanyl gtt Begin dexmedetomidine 9/15 Daily WUA  TODAY'S SUMMARY:   I have personally obtained a history, examined the patient, evaluated laboratory and imaging results, formulated the assessment and plan and placed orders. CRITICAL CARE:  The patient is critically ill with multiple organ systems failure and requires high complexity decision making for assessment and support, frequent evaluation and titration of therapies, application of advanced monitoring technologies and extensive interpretation of multiple databases. Critical Care Time devoted to patient care services described in this note is 35 minutes.    Billy Fischer, MD ; [facility] [phone].  After 5:30 PM or weekends, call [phone]  11/13/2013  2:14 PM

## 2013-11-13 NOTE — Progress Notes (Signed)
   Daily Progress Note  Assessment/Planning: POD #7 s/p Valvuloplasty, EVAR for large AAA, Abd pain of unknown etiology   NEURO: not responsive, will need to lighten sedation to see if intact, sedation to help with vent  PULM: Vent wean per PCCM.  Suspicious ETT aspirate & abnl pcxr & increased WBC raise concern for possible vent related PNA. Sputum cx.  CV: stable for now, off vasopressors  GI: unchanged exam, no guarding but suspect possible R colon ischemia.  Not certain this pt can survive resection.  Dr. Dwain Sarna reportedly is going to speak with the family in regards to their wishes  FEN: Cont TPN  REN: total fluid overload, diuresis once BP more solid and renal function improving  ID: on Zosyn  Subjective  - 7 Days Post-Op  No events overnight  Objective Filed Vitals:   11/13/13 0700 11/13/13 0728 11/13/13 0800 11/13/13 0900  BP: 132/67  127/73 98/56  Pulse: 85  86 88  Temp:  98.3 F (36.8 C)    TempSrc:  Axillary    Resp: Height:      Weight:      SpO2: 95%  97% 94%    Intake/Output Summary (Last 24 hours) at 11/13/13 0947 Last data filed at 11/13/13 0901  Gross per 24 hour  Intake 2323.16 ml  Output   1720 ml  Net 603.16 ml   NEURO non-responsive PULM  BLL rales, intubated on vent Vent Mode:  [-] PRVC FiO2 (%):  [50 %-60 %] 50 % Set Rate:  [12 bmp] 12 bmp Vt Set:  [580 mL] 580 mL PEEP:  [5 cmH20] 5 cmH20 Plateau Pressure:  [17 cmH20-20 cmH20] 18 cmH20 CV  RRR GI  soft, ND, TTP primarily R sided REN 1.6 UOP/24 hr VASC  B feet viable  Laboratory CBC    Component Value Date/Time   WBC 22.1* 11/12/2013 0340   HGB 8.5* 11/12/2013 1249   HCT 25.0* 11/12/2013 1249   PLT 59* 11/12/2013 0340    BMET    Component Value Date/Time   NA 141 11/13/2013 0420   K 3.6* 11/13/2013 0420   CL 103 11/13/2013 0420   CO2 26 11/13/2013 0420   GLUCOSE 131* 11/13/2013 0420   BUN 62* 11/13/2013 0420   CREATININE 3.19* 11/13/2013 0420   CALCIUM 7.7*  11/13/2013 0420   GFRNONAA 16* 11/13/2013 0420   GFRAA 19* 11/13/2013 0420    [name], MD [facility] [place] Office: [phone] Pager: [phone]  11/13/2013, 9:47 AM

## 2013-11-13 NOTE — Progress Notes (Signed)
PARENTERAL NUTRITION CONSULT NOTE   Pharmacy Consult for TNA  Indication: Possible bowel ischemia   Allergies  Allergen Reactions  . Penicillins     unknown    Patient Measurements: Height:  (177.8 cm) Weight: 209 lb 3.5 oz (94.9 kg) IBW/kg (Calculated) : 73  Vital Signs: Temp: 98.3 F (36.8 C) (09/15 0400) Temp src: Axillary (09/15 0400) BP: 132/67 mmHg (09/15 0700) Pulse Rate: 85 (09/15 0700) Intake/Output from previous day: 09/14 0701 - 09/15 0700 In: 2203.2 [I.V.:705; NG/GT:30; IV Piggyback:150; TPN:1318.2] Out: 1615 [Urine:1565; Emesis/NG output:50] Intake/Output from this shift:    Labs:  Recent Labs  11/11/13 0407 11/11/13 0844 11/12/13 0340 11/12/13 1249  WBC 18.6*  --  22.1*  --   HGB 8.2* 9.2* 8.9* 8.5*  HCT 23.0* 27.0* 25.5* 25.0*  PLT 49*  --  59*  --      Recent Labs  11/10/13 2317 11/11/13 0407 11/11/13 0844 11/12/13 0340 11/12/13 1249 11/13/13 0420  NA 136*  --  138 140 140 141  K 3.1*  --  3.3* 3.7 3.2* 3.6*  CL 98  --  99 101 105 103  CO2 23  --   --  26  --  26  GLUCOSE 101*  --  109* 120* 109* 131*  BUN 52*  --  43* 56* 45* 62*  CREATININE 3.30*  --  3.70* 3.28* 3.30* 3.19*  CALCIUM 7.0*  --   --  7.7*  --  7.7*  MG 1.7 1.7  --  1.9  --   --   PHOS  --  4.0  --  4.3  --   --   PROT  --   --   --  4.5*  --   --   ALBUMIN  --   --   --  1.4*  --   --   AST  --   --   --  115*  --   --   ALT  --   --   --  193*  --   --   ALKPHOS  --   --   --  96  --   --   BILITOT  --   --   --  2.7*  --   --   PREALBUMIN  --   --   --  5.9*  --   --   TRIG  --   --   --  122  --   --    Estimated Creatinine Clearance: 19.9 ml/min (by C-G formula based on Cr of 3.19).    Recent Labs  11/12/13 2051 11/12/13 2341 11/13/13 0351  GLUCAP 111* 119* 120*    Medical History: Past Medical History  Diagnosis Date  . Hypertension   . Gout   . Glaucoma   . COPD (chronic obstructive pulmonary disease)     Medications:   Prescriptions prior to admission  Medication Sig Dispense Refill  . allopurinol (ZYLOPRIM) 100 MG tablet Take by mouth daily.      Marland Kitchen amLODipine (NORVASC) 5 MG tablet Take 5 mg by mouth daily.      . dorzolamide (TRUSOPT) 2 % ophthalmic solution Place 1 drop into both eyes 2 (two) times daily.       . traMADol (ULTRAM) 50 MG tablet Take 50 mg by mouth every 6 (six) hours as needed for moderate pain.         Insulin Requirements in the past 24 hours:  1  unit, On SSI   Assessment: 23 YOM admitted with abdominal pain due to suspected bowel ischemia. Pt is malnourished due to inadequate oral intake prior to admission. Since admission patient has been eating 100% of meals. Pt is currently NPO.   GI: Watershed bowel ischemia s/p AAA repair.  Endo: No hx of DM, CBGs controlled  Lytes: K 3.6, Na 141 Ionized Ca 1.02 Renal: SCr 3.19, UOP 0.7 mL/kg/hr, +587mL yesterday Pulm: On ventilator. FiO2 50% Cards: H/o HTN, VSS  Hepatobil: transaminases elevated, t bili 2.7  TG 122 ID: Intra-abdominal infx, WBC elevated at 22.1, On day #7 of abx  Best Practices: SCDs, Protonix IV  TPN Access: CVC Rt internal Jugular  TPN day#: 2  Current Nutrition:  Clinimix E 5/15 at 50 ml/hr, lipids @ 10 ml/hr Goal rate 90 ml/hr  Nutritional Goals Per RD:  Kcal: 1850-2000  Protein: 100-110 gm  Plan:  -Increase Clinimix E 5/15 to 60 mL/hr and cont IV fat emulsion at 10 mL/hr.  -KCl 10 mEq IV X 3 -Daily MVI and change TE to MWF -Monitor patient closely for signs of re-feeding  -Titrate TPN carefully given than patient is already fluid overloaded.  -f/u am labs  Talbert Cage, PharmD.  Clinical Pharmacist Pager [phone]

## 2013-11-14 ENCOUNTER — Inpatient Hospital Stay (HOSPITAL_COMMUNITY): Payer: Medicare Other

## 2013-11-14 DIAGNOSIS — J9600 Acute respiratory failure, unspecified whether with hypoxia or hypercapnia: Secondary | ICD-10-CM

## 2013-11-14 LAB — BASIC METABOLIC PANEL
ANION GAP: 15 (ref 5–15)
BUN: 72 mg/dL — ABNORMAL HIGH (ref 6–23)
CALCIUM: 7.7 mg/dL — AB (ref 8.4–10.5)
CO2: 24 mEq/L (ref 19–32)
Chloride: 104 mEq/L (ref 96–112)
Creatinine, Ser: 3.27 mg/dL — ABNORMAL HIGH (ref 0.50–1.35)
GFR calc non Af Amer: 16 mL/min — ABNORMAL LOW (ref >90)
GFR, EST AFRICAN AMERICAN: 19 mL/min — AB (ref >90)
Glucose, Bld: 139 mg/dL — ABNORMAL HIGH (ref 70–99)
Potassium: 3.9 mEq/L (ref 3.7–5.3)
Sodium: 143 mEq/L (ref 137–147)

## 2013-11-14 LAB — TYPE AND SCREEN
ABO/RH(D): A POS
ANTIBODY SCREEN: NEGATIVE
UNIT DIVISION: 0
Unit division: 0

## 2013-11-14 LAB — GLUCOSE, CAPILLARY
GLUCOSE-CAPILLARY: 102 mg/dL — AB (ref 70–99)
GLUCOSE-CAPILLARY: 122 mg/dL — AB (ref 70–99)
GLUCOSE-CAPILLARY: 125 mg/dL — AB (ref 70–99)
GLUCOSE-CAPILLARY: 138 mg/dL — AB (ref 70–99)
Glucose-Capillary: 128 mg/dL — ABNORMAL HIGH (ref 70–99)
Glucose-Capillary: 120 mg/dL — ABNORMAL HIGH (ref 70–99)

## 2013-11-14 LAB — CBC
HCT: 26.2 % — ABNORMAL LOW (ref 39.0–52.0)
Hemoglobin: 8.9 g/dL — ABNORMAL LOW (ref 13.0–17.0)
MCH: 30.8 pg (ref 26.0–34.0)
MCHC: 34 g/dL (ref 30.0–36.0)
MCV: 90.7 fL (ref 78.0–100.0)
PLATELETS: 84 10*3/uL — AB (ref 150–400)
RBC: 2.89 MIL/uL — AB (ref 4.22–5.81)
RDW: 16.4 % — AB (ref 11.5–15.5)
WBC: 26.1 10*3/uL — ABNORMAL HIGH (ref 4.0–10.5)

## 2013-11-14 LAB — PROCALCITONIN: PROCALCITONIN: 43.5 ng/mL

## 2013-11-14 LAB — PHOSPHORUS: Phosphorus: 4.8 mg/dL — ABNORMAL HIGH (ref 2.3–4.6)

## 2013-11-14 LAB — MAGNESIUM: Magnesium: 2 mg/dL (ref 1.5–2.5)

## 2013-11-14 MED ORDER — DEXTROSE 10 % IV SOLN: INTRAVENOUS | Status: DC

## 2013-11-14 MED ORDER — FUROSEMIDE 10 MG/ML IJ SOLN
80.0000 mg | Freq: Four times a day (QID) | INTRAMUSCULAR | Status: AC
  Administered 2013-11-14 (×2): 80 mg via INTRAVENOUS

## 2013-11-14 MED ORDER — FAT EMULSION 20 % IV EMUL
250.0000 mL | INTRAVENOUS | Status: AC
  Administered 2013-11-14: 250 mL via INTRAVENOUS
  Filled 2013-11-14: qty 250

## 2013-11-14 MED ORDER — TRACE MINERALS CR-CU-F-FE-I-MN-MO-SE-ZN IV SOLN
INTRAVENOUS | Status: AC
  Administered 2013-11-14: 17:00:00 via INTRAVENOUS
  Filled 2013-11-14: qty 2000

## 2013-11-14 NOTE — Progress Notes (Addendum)
Wake up assessment aborted.  Patient has eyes open, grimacing with any movement and to touch.  Instead of decreasing fentanyl for wake up assessment, increased fentanyl and restarted precedex for pain management. Patient has movement of toes to commands.  Withdraws from painful stimuli upper extremities.  Does not lift on command/spontaneously upper or lower extremities.

## 2013-11-14 NOTE — Progress Notes (Signed)
8 Days Post-Op  Subjective: unchanged  Objective: Vital signs in last 24 hours: Temp:  [98.1 F (36.7 C)-98.3 F (36.8 C)] 98.2 F (36.8 C) (09/16 0400) Pulse Rate:  [81-98] 96 (09/16 0800) Resp:  [17-27] 24 (09/16 0800) BP: (84-173)/(52-85) 115/63 mmHg (09/16 0800) SpO2:  [92 %-97 %] 93 % (09/16 0800) Arterial Line BP: (85-151)/(46-69) 131/62 mmHg (09/16 0800) FiO2 (%):  [40 %-50 %] 40 % (09/16 0800) Last BM Date: 11/05/2013  Intake/Output from previous day: 09/15 0701 - 09/16 0700 In: 2823.4 [I.V.:855.2; NG/GT:120; IV Piggyback:300; ZOX:0960.4] Out: 2575 [Urine:2525; Emesis/NG output:50] Intake/Output this shift: Total I/O In: 90 [I.V.:30; TPN:60] Out: -   GI: tender to palpation diffusely  Lab Results:   Recent Labs  11/13/13 1120 11/14/13 0410  WBC 24.1* 26.1*  HGB 9.0* 8.9*  HCT 26.7* 26.2*  PLT 76* 84*   BMET  Recent Labs  11/13/13 0420 11/14/13 0410  NA 141 143  K 3.6* 3.9  CL 103 104  CO2 26 24  GLUCOSE 131* 139*  BUN 62* 72*  CREATININE 3.19* 3.27*  CALCIUM 7.7* 7.7*   PT/INR No results found for this basename: LABPROT, INR,  in the last 72 hours ABG  Recent Labs  11/11/13 1319  PHART 7.439  HCO3 26.0*    Studies/Results: Dg Chest Port 1 View  11/13/2013   CLINICAL DATA:  Extubation. Followup basilar atelectasis versus pneumonia.  EXAM: PORTABLE CHEST - 1 VIEW  COMPARISON:  Portable chest x-rays 11/11/2013 dating back to November 26, 2013.  FINDINGS: Right jugular central venous catheter tip projects over the lower SVC. Right jugular introducer sheath tip projects over the upper SVC. Nasogastric to courses below the diaphragm with its tip in the fundus of the stomach. Worsened aeration in the right lung base. Stable dense consolidation in the left lower lobe. Mild pulmonary venous hypertension without overt edema.  IMPRESSION: Support apparatus satisfactory. Worsening atelectasis at the right lung base. Stable dense left lower lobe atelectasis  and/or pneumonia.   Electronically Signed   By: Hulan Saas M.D.   On: 11/13/2013 08:10    Anti-infectives: Anti-infectives   Start     Dose/Rate Route Frequency Ordered Stop   11/09/13 1600  vancomycin (VANCOCIN) IVPB 750 mg/150 ml premix  Status:  Discontinued     750 mg 150 mL/hr over 60 Minutes Intravenous Every 24 hours 11/09/13 1539 11/10/13 1201   11/08/13 2000  piperacillin-tazobactam (ZOSYN) IVPB 2.25 g     2.25 g 100 mL/hr over 30 Minutes Intravenous 3 times per day 11/08/13 1204     11/08/13 1500  vancomycin (VANCOCIN) IVPB 750 mg/150 ml premix  Status:  Discontinued     750 mg 150 mL/hr over 60 Minutes Intravenous Every 24 hours 11/08/13 1449 11/09/13 1404   11/07/13 1215  vancomycin (VANCOCIN) 250 mg in sodium chloride 0.9 % 100 mL IVPB     250 mg 100 mL/hr over 60 Minutes Intravenous  Once 11/07/13 1209 11/07/13 1559   11/07/13 1200  piperacillin-tazobactam (ZOSYN) IVPB 3.375 g  Status:  Discontinued     3.375 g 12.5 mL/hr over 240 Minutes Intravenous 3 times per day 11/07/13 1154 11/08/13 1204   11/07/13 1000  vancomycin (VANCOCIN) 500 mg in sodium chloride 0.9 % 100 mL IVPB  Status:  Discontinued     500 mg 100 mL/hr over 60 Minutes Intravenous Every 24 hours 11/07/13 0905 11/07/13 1208   2013/11/26 0615  levofloxacin (LEVAQUIN) IVPB 500 mg     500 mg 100  mL/hr over 60 Minutes Intravenous To Surgery 11/16/2013 0605 11/26/2013 0820   11/11/2013 0600  vancomycin (VANCOCIN) IVPB 1000 mg/200 mL premix  Status:  Discontinued     1,000 mg 200 mL/hr over 60 Minutes Intravenous On call to O.R. 11/05/13 0745 11/01/2013 1450   10/30/2013 0600  vancomycin (VANCOCIN) IVPB 1000 mg/200 mL premix  Status:  Discontinued     1,000 mg 200 mL/hr over 60 Minutes Intravenous To Surgery 10/30/13 0730 11/01/13 1740      Assessment/Plan: Mesenteric ischemia 1. Continue abx per primary team 2. He has mesenteric ischemia that has been followed, he clearly still has symptoms although no  transmural ischemia. He has no bowel function.  I think still at this point there is no operative fix to this. I discussed with his daughter yesterday not pursuing surgery if there is perforation and she is contemplating that. I think at some point without any bowel function, other issues a conversation about goals is reasonable.      [facility] 11/14/2013

## 2013-11-14 NOTE — Progress Notes (Signed)
   Daily Progress Note  Assessment/Planning:  POD #8 s/p Valvuloplasty, EVAR for large AAA, Abd pain of unknown etiology   PULM: Vent wean per PCCM, sputum cx GI: see Dr. Doreen Salvage discussion.  Hold on x-lap for now due to high risk FEN: Cont TPN  REN: total fluid overload, diuresis once BP more solid and renal function improving  ID: on Zosyn  Subjective  - 8 Days Post-Op  No events overnight, no progress on vent weaning  Objective Filed Vitals:   11/14/13 0600 11/14/13 0700 11/14/13 0800 11/14/13 0826  BP: 111/57 107/56 115/63   Pulse: 89 95 96   Temp:    98.6 F (37 C)  TempSrc:    Oral  Resp: Height:      Weight:      SpO2: 95% 95% 93%     Intake/Output Summary (Last 24 hours) at 11/14/13 0838 Last data filed at 11/14/13 0700  Gross per 24 hour  Intake 2653.38 ml  Output   2450 ml  Net 203.38 ml    PULM  B wheezing CV  RRR GI  Exam unchanged: R side TTP VASC  Both feet viable  Laboratory CBC    Component Value Date/Time   WBC 26.1* 11/14/2013 0410   HGB 8.9* 11/14/2013 0410   HCT 26.2* 11/14/2013 0410   PLT 84* 11/14/2013 0410    BMET    Component Value Date/Time   NA 143 11/14/2013 0410   K 3.9 11/14/2013 0410   CL 104 11/14/2013 0410   CO2 24 11/14/2013 0410   GLUCOSE 139* 11/14/2013 0410   BUN 72* 11/14/2013 0410   CREATININE 3.27* 11/14/2013 0410   CALCIUM 7.7* 11/14/2013 0410   GFRNONAA 16* 11/14/2013 0410   GFRAA 19* 11/14/2013 0410    [name], MD [facility] [place] [facility] Office: [phone] Pager: [phone]  11/14/2013, 8:38 AM

## 2013-11-14 NOTE — Progress Notes (Signed)
PARENTERAL NUTRITION CONSULT NOTE   Pharmacy Consult for TNA  Indication: Possible bowel ischemia   Allergies  Allergen Reactions  . Penicillins     unknown    Patient Measurements: Height:  (177.8 cm) Weight: 209 lb 3.5 oz (94.9 kg) IBW/kg (Calculated) : 73  Vital Signs: Temp: 98.2 F (36.8 C) (09/16 0400) Temp src: Oral (09/16 0400) BP: 107/56 mmHg (09/16 0700) Pulse Rate: 95 (09/16 0700) Intake/Output from previous day: 09/15 0701 - 09/16 0700 In: 2823.4 [I.V.:855.2; NG/GT:120; IV Piggyback:300; TPN:1548.2] Out: 2575 [Urine:2525; Emesis/NG output:50] Intake/Output from this shift:    Labs:  Recent Labs  11/12/13 0340 11/12/13 1249 11/13/13 1120 11/14/13 0410  WBC 22.1*  --  24.1* 26.1*  HGB 8.9* 8.5* 9.0* 8.9*  HCT 25.5* 25.0* 26.7* 26.2*  PLT 59*  --  76* 84*     Recent Labs  11/11/13 0844 11/12/13 0340 11/12/13 1249 11/13/13 0420 11/14/13 0410  NA 138 140 140 141 143  K 3.3* 3.7 3.2* 3.6* 3.9  CL 99 101 105 103 104  CO2  --  26  --  26 24  GLUCOSE 109* 120* 109* 131* 139*  BUN 43* 56* 45* 62* 72*  CREATININE 3.70* 3.28* 3.30* 3.19* 3.27*  CALCIUM  --  7.7*  --  7.7* 7.7*  MG  --  1.9  --   --  2.0  PHOS  --  4.3  --   --  4.8*  PROT  --  4.5*  --   --   --   ALBUMIN  --  1.4*  --   --   --   AST  --  115*  --   --   --   ALT  --  193*  --   --   --   ALKPHOS  --  96  --   --   --   BILITOT  --  2.7*  --   --   --   PREALBUMIN  --  5.9*  --   --   --   TRIG  --  122  --   --   --    Estimated Creatinine Clearance: 19.5 ml/min (by C-G formula based on Cr of 3.27).    Recent Labs  11/13/13 1946 11/13/13 2355 11/14/13 0404  GLUCAP 121* 122* 138*    Medical History: Past Medical History  Diagnosis Date  . Hypertension   . Gout   . Glaucoma   . COPD (chronic obstructive pulmonary disease)     Medications:  Prescriptions prior to admission  Medication Sig Dispense Refill  . allopurinol (ZYLOPRIM) 100 MG tablet Take by  mouth daily.      Marland Kitchen amLODipine (NORVASC) 5 MG tablet Take 5 mg by mouth daily.      . dorzolamide (TRUSOPT) 2 % ophthalmic solution Place 1 drop into both eyes 2 (two) times daily.       . traMADol (ULTRAM) 50 MG tablet Take 50 mg by mouth every 6 (six) hours as needed for moderate pain.         Insulin Requirements in the past 24 hours:  5 unit, On SSI   Assessment: 22 YOM admitted with abdominal pain due to suspected bowel ischemia. Pt is malnourished due to inadequate oral intake prior to admission. Since admission patient has been eating 100% of meals. Pt is currently NPO.   GI: Watershed bowel ischemia s/p AAA repair. Not sure if pt would survive colon resection  Endo: No hx of DM, CBGs controlled  Lytes: K 3.9, Na 143, mag 2.0, phos 4.8, CoCa 9.78  CoCa x phos =46.9 Renal: SCr 3.27, UOP 1.1 mL/kg/hr, +238mL yesterday  Weight up 13# last few days Pulm: On ventilator. FiO2 50% Cards: H/o HTN, BP soft Hepatobil: transaminases elevated, t bili 2.7  TG 122 ID: Intra-abdominal infx, WBC elevated at 26.1, On day #8 of abx  Best Practices: SCDs, Protonix IV  TPN Access: CVC Rt internal Jugular  TPN day#: 3  Current Nutrition:  Clinimix E 5/15 at 60 ml/hr, lipids @ 10 ml/hr Goal rate 90 ml/hr  Nutritional Goals Per RD:  Kcal: 1850-2000  Protein: 100-110 gm  Plan:  -Cont Clinimix E 5/15 at 60 mL/hr and cont IV fat emulsion at 10 mL/hr.  -Cont MVI daily and TE to MWF -Advance TPN when fluid status improves  -f/u am labs  Talbert Cage, PharmD.  Clinical Pharmacist Pager [phone]

## 2013-11-14 NOTE — Progress Notes (Signed)
   [facility]  Nursing staff was changing the right IJ line and noted "aqua" color around the ring.  This indicates pseudomonas infection.   The patient is receiving Zosyn IV currently.  WBC 26.1  COLLINS, EMMA MAUREEN PA-C

## 2013-11-14 NOTE — Progress Notes (Signed)
NUTRITION FOLLOW UP  DOCUMENTATION CODES Per approved criteria  -Severe malnutrition in the context of acute illness or injury   INTERVENTION:  TPN per pharmacy RD to follow for nutrition care plan  NUTRITION DIAGNOSIS: Malnutrition related to inadequate oral intake as evidenced by moderate depletion of muscle mass and 7% weight loss in 1 month, ongoing  Goal: Pt to meet >/= 90% of their estimated nutrition needs, progrsesing  Monitor:  TPN prescription, respiratory status, weight, labs, I/O's  ASSESSMENT: 80 yo patient sent to hospital by Dr. Shana Chute for murmur and palpable abdominal mass. Patient found to have 8cm AAA.   9/1:  Patient reports that he has lost weight over the past month due to poor appetite with abdominal pain. Since admission, he has been eating 100% of meals.  Patient transferred to 2S-SICU from 2W-Cardiac post-op.  Patient s/p procedure 9/8: INTRODUCTION OF LEFT CONTRALATERAL LIMB OF EVAR LEFT COMMON FEMORAL ARTERY  Patient is currently intubated on ventilator support MV: 14.1 L/min Temp (24hrs), Avg:98.4 F (36.9 C), Min:98.2 F (36.8 C), Max:98.6 F (37 C)   TPN initiated for possible bowel ischemia 9/14.  Patient is receiving TPN with Clinimix E 5/15 @ 60 ml/hr and lipids @ 10 ml/hr. Provides 1502 kcal and 72 grams protein per day. Meets 75% minimum estimated energy needs and 72% minimum estimated protein needs.  Height: Ht Readings from Last 1 Encounters:  Nov 09, 2013  (1.778 m)    Weight: -----> trended up; + fluid overload Wt Readings from Last 1 Encounters:  11/13/13 209 lb 3.5 oz (94.9 kg)    9/14  206 lb 9/12  196 lb 9/09  170 lb 9/08  155 lb 9/07  155 lb 8/31  161 lb 8/31  151 lb  BMI:  Body mass index is 30.02 kg/(m^2). highly skewed  Re-estimated Nutritional Needs: Kcal: 2000-2150 Protein: 100-110 gm Fluid: per MD  Skin: Intact  Diet Order: NPO   Intake/Output Summary (Last 24 hours) at 11/14/13 1445 Last  data filed at 11/14/13 1300  Gross per 24 hour  Intake 2541.37 ml  Output   1995 ml  Net 546.37 ml    Labs:   Recent Labs Lab 11/11/13 0407  11/12/13 0340 11/12/13 1249 11/13/13 0420 11/14/13 0410  NA  --   < > 140 140 141 143  K  --   < > 3.7 3.2* 3.6* 3.9  CL  --   < > 101 105 103 104  CO2  --   --  26  --  26 24  BUN  --   < > 56* 45* 62* 72*  CREATININE  --   < > 3.28* 3.30* 3.19* 3.27*  CALCIUM  --   --  7.7*  --  7.7* 7.7*  MG 1.7  --  1.9  --   --  2.0  PHOS 4.0  --  4.3  --   --  4.8*  GLUCOSE  --   < > 120* 109* 131* 139*  < > = values in this interval not displayed.  Scheduled Meds: . antiseptic oral rinse  7 mL Mouth Rinse QID  . chlorhexidine  15 mL Mouth Rinse BID  . dorzolamide  1 drop Both Eyes BID  . fentaNYL  50 mcg Intravenous Once  . furosemide  80 mg Intravenous Q6H  . insulin aspart  0-9 Units Subcutaneous 6 times per day  . ipratropium-albuterol  3 mL Nebulization Q6H  . pantoprazole (PROTONIX) IV  40 mg  Intravenous Q24H  . piperacillin-tazobactam (ZOSYN)  IV  2.25 g Intravenous 3 times per day    Continuous Infusions: . dexmedetomidine Stopped (11/14/13 1000)  . Marland KitchenTPN (CLINIMIX-E) Adult 60 mL/hr at 11/14/13 1300   And  . fat emulsion 250 mL (11/14/13 1300)  . Marland KitchenTPN (CLINIMIX-E) Adult     And  . fat emulsion    . fentaNYL infusion INTRAVENOUS 300 mcg/hr (11/14/13 1300)    Past Medical History  Diagnosis Date  . Hypertension   . Gout   . Glaucoma   . COPD (chronic obstructive pulmonary disease)     Past Surgical History  Procedure Laterality Date  . Total hip arthroplasty    . Lumbar disc surgery    . Abdominal aortic endovascular stent graft Bilateral 11/05/2013    Procedure: ABDOMINAL AORTIC ENDOVASCULAR STENT GRAFT REPAIR ;  Surgeon: Fransisco Hertz, MD;  Location: [facility] OR;  Service: Vascular;  Laterality: Bilateral;  . Balloon aortic valve valvuloplasty N/A 11/05/2013    Procedure: BALLOON AORTIC VALVE VALVULOPLASTY;  Surgeon: [name], MD;  Location: [facility] OR;  Service: Cardiovascular;  Laterality: N/A;  [name] will start first  . Endarterectomy femoral Right 11/20/2013    Procedure: ENDARTERECTOMY FEMORAL WITH PATCH ANGIOPLASTY;  Surgeon: Fransisco Hertz, MD;  Location: [facility] OR;  Service: Vascular;  Laterality: Right;    Maureen Chatters, RD, LDN Pager #: [phone] After-Hours Pager #: [phone]

## 2013-11-14 NOTE — Progress Notes (Signed)
[facility] Progress Note  8 Days Post-Op  Subjective: No changes, appears to be in severe pain, frequent pain medication dosing.  Weaning unsuccessful thus far, still on vent, critically ill.    Objective: Vital signs in last 24 hours: Temp:  [98.1 F (36.7 C)-98.3 F (36.8 C)] 98.2 F (36.8 C) (09/16 0400) Pulse Rate:  [81-98] 95 (09/16 0700) Resp:  [17-27] 20 (09/16 0700) BP: (84-173)/(52-85) 107/56 mmHg (09/16 0700) SpO2:  [92 %-97 %] 95 % (09/16 0700) Arterial Line BP: (85-167)/(46-77) 114/57 mmHg (09/16 0700) FiO2 (%):  [40 %-50 %] 40 % (09/16 0740) Last BM Date: 11/24/2013  Intake/Output from previous day: 09/15 0701 - 09/16 0700 In: 2823.4 [I.V.:855.2; NG/GT:120; IV Piggyback:300; ZOX:0960.4] Out: 2575 [Urine:2525; Emesis/NG output:50] Intake/Output this shift:    PE: Gen:  Awake with eyes open, not very alert, critically ill, not able to communicate Card:  Tachy, normal rhythm, no M/G/R heard Pulm:  On vent Abd: Soft, NT/ND, +BS, no HSM, incisions C/D/I, drain with minimal sanguinous drainage, no abdominal scars noted Ext:  All 4 extremities edematous and severe scrotal edema  Lab Results:   Recent Labs  11/13/13 1120 11/14/13 0410  WBC 24.1* 26.1*  HGB 9.0* 8.9*  HCT 26.7* 26.2*  PLT 76* 84*   BMET  Recent Labs  11/13/13 0420 11/14/13 0410  NA 141 143  K 3.6* 3.9  CL 103 104  CO2 26 24  GLUCOSE 131* 139*  BUN 62* 72*  CREATININE 3.19* 3.27*  CALCIUM 7.7* 7.7*   PT/INR No results found for this basename: LABPROT, INR,  in the last 72 hours CMP     Component Value Date/Time   NA 143 11/14/2013 0410   K 3.9 11/14/2013 0410   CL 104 11/14/2013 0410   CO2 24 11/14/2013 0410   GLUCOSE 139* 11/14/2013 0410   BUN 72* 11/14/2013 0410   CREATININE 3.27* 11/14/2013 0410   CALCIUM 7.7* 11/14/2013 0410   PROT 4.5* 11/12/2013 0340   ALBUMIN 1.4* 11/12/2013 0340   AST 115* 11/12/2013 0340   ALT 193* 11/12/2013 0340   ALKPHOS 96 11/12/2013 0340   BILITOT 2.7* 11/12/2013 0340   GFRNONAA 16* 11/14/2013 0410   GFRAA 19* 11/14/2013 0410   Lipase     Component Value Date/Time   LIPASE 33 11/08/2013 0345       Studies/Results: Dg Chest Port 1 View  11/13/2013   CLINICAL DATA:  Extubation. Followup basilar atelectasis versus pneumonia.  EXAM: PORTABLE CHEST - 1 VIEW  COMPARISON:  Portable chest x-rays 11/11/2013 dating back to 11/03/2013.  FINDINGS: Right jugular central venous catheter tip projects over the lower SVC. Right jugular introducer sheath tip projects over the upper SVC. Nasogastric to courses below the diaphragm with its tip in the fundus of the stomach. Worsened aeration in the right lung base. Stable dense consolidation in the left lower lobe. Mild pulmonary venous hypertension without overt edema.  IMPRESSION: Support apparatus satisfactory. Worsening atelectasis at the right lung base. Stable dense left lower lobe atelectasis and/or pneumonia.   Electronically Signed   By: Hulan Saas M.D.   On: 11/13/2013 08:10    Anti-infectives: Anti-infectives   Start     Dose/Rate Route Frequency Ordered Stop   11/09/13 1600  vancomycin (VANCOCIN) IVPB 750 mg/150 ml premix  Status:  Discontinued     750 mg 150 mL/hr over 60 Minutes Intravenous Every 24 hours 11/09/13 1539 11/10/13 1201   11/08/13 2000  piperacillin-tazobactam (ZOSYN) IVPB 2.25 g  2.25 g 100 mL/hr over 30 Minutes Intravenous 3 times per day 11/08/13 1204     11/08/13 1500  vancomycin (VANCOCIN) IVPB 750 mg/150 ml premix  Status:  Discontinued     750 mg 150 mL/hr over 60 Minutes Intravenous Every 24 hours 11/08/13 1449 11/09/13 1404   11/07/13 1215  vancomycin (VANCOCIN) 250 mg in sodium chloride 0.9 % 100 mL IVPB     250 mg 100 mL/hr over 60 Minutes Intravenous  Once 11/07/13 1209 11/07/13 1559   11/07/13 1200  piperacillin-tazobactam (ZOSYN) IVPB 3.375 g  Status:  Discontinued     3.375 g 12.5 mL/hr over 240 Minutes Intravenous 3 times per day 11/07/13  1154 11/08/13 1204   11/07/13 1000  vancomycin (VANCOCIN) 500 mg in sodium chloride 0.9 % 100 mL IVPB  Status:  Discontinued     500 mg 100 mL/hr over 60 Minutes Intravenous Every 24 hours 11/07/13 0905 11/07/13 1208   11/25/2013 0615  levofloxacin (LEVAQUIN) IVPB 500 mg     500 mg 100 mL/hr over 60 Minutes Intravenous To Surgery 11/05/2013 0605 11/04/2013 0820   11/13/2013 0600  vancomycin (VANCOCIN) IVPB 1000 mg/200 mL premix  Status:  Discontinued     1,000 mg 200 mL/hr over 60 Minutes Intravenous On call to O.R. 11/05/13 0745 11/12/2013 1450   11/28/2013 0600  vancomycin (VANCOCIN) IVPB 1000 mg/200 mL premix  Status:  Discontinued     1,000 mg 200 mL/hr over 60 Minutes Intravenous To Surgery 10/30/13 0730 11/01/13 1740       Assessment/Plan Ischemic bowel s/p AAA repair  8/31 CT angio AB > AAA 8.5 cm, tortuos R iliac arterial system, occluded SFA, 17 mm renal mass left  9/4 CT angio heart/chest> emphysema, calcified mitral and aortic valves, calcified trileaflet aortic valve  9/8 Aortic valvuloplasty, bilat common fem artery cannulation, R iliofemoral endarterectomy, repair left common femoral artery, aortogram, repair of aorta with bifurcated prosthesis  Acute resp failure  Acute on chronic renal failure  Liver injury/failure  Leukocytosis - worse yesterday  Plan:  1. Continue zosyn Day #8 and vanc stopped but had 4 days, repeat CBC today and tomorrow  2. High risk of surgery because of aortic valve  3. Continue to treat conservatively, appears about the same today (edematous, multi system organ injury), appears in pain, surgery may not be the best option at this point in time  4. Weaned off pressors, vent weaning a challenge thus far 5. TPN, NPO, pain control  6. Further recommendations per Dr. Dwain Sarna - watchful waiting, outcome not likely good, still at risk for having surgical emergency.  Does not appear to be transmural ischemia at this point.  Daughter talked with Dr. Dwain Sarna on  9/15.  She was leaning towards no surgical interventions if a surgical emergency arose.  No decision has been made yet.  Palliative goals of care meeting may be necessary.    LOS: 16 days    DORT, Aundra Millet 11/14/2013, 7:52 AM Pager: [phone]

## 2013-11-14 NOTE — Procedures (Signed)
Central Venous Catheter Insertion Procedure Note Shawn Mathews [id number] July 30, 1927  Procedure: Insertion of Central Venous Catheter Indications: Assessment of intravascular volume, Drug and/or fluid administration and Frequent blood sampling  Procedure Details Consent: Risks of procedure as well as the alternatives and risks of each were explained to the (patient/caregiver).  Consent for procedure obtained. Time Out: Verified patient identification, verified procedure, site/side was marked, verified correct patient position, special equipment/implants available, medications/allergies/relevent history reviewed, required imaging and test results available.  Performed  Maximum sterile technique was used including antiseptics, cap, gloves, gown, hand hygiene, mask and sheet. Skin prep: Chlorhexidine; local anesthetic administered A antimicrobial bonded/coated triple lumen catheter was placed in the left internal jugular vein using the Seldinger technique.  Evaluation Blood flow good Complications: No apparent complications Patient did tolerate procedure well. Chest X-ray ordered to verify placement.  CXR: pending.  Procedure performed under direct ultrasound guidance for real time vessel cannulation.      Shawn Guys, PA - [facility] Pgr: [phone]  or [phone]   I was present for and supervised the entire procedure  Shawn Fischer, MD ; [facility] service [place] [phone].  After 5:30 PM or weekends, call [phone]

## 2013-11-14 NOTE — Progress Notes (Signed)
PULMONARY / CRITICAL CARE MEDICINE   Name: Shawn Mathews MRN: [id number] DOB: 1927/07/08    ADMISSION DATE:  10/11/2013 CONSULTATION DATE:  9/8  REFERRING MD :  Imogene Burn  CHIEF COMPLAINT:  Post op vent management  INITIAL PRESENTATION: 80 y/o male admitted on 9/8 with abdominal pain felt to be due to AAA, found to have critical symptomatic aortic stenosis. Underwent endovascular AAA repair on 9/8 with valvuloplasty so PCCM consulted for post op vent management.  STUDIES:  8/31 CT angio AB > AAA 8.5 cm, tortuous R iliac arterial system, occluded SFA, 17 mm renal mass left 9/4 CT angio heart/chest> emphysema, calcified mitral and aortic valves, calcified trileaflet aortic valve  SIGNIFICANT EVENTS: 9/8 Aortic valvuloplasty, bilat common fem artery cannulation, R iliofemoral endarterectomy, repair left common femoral artery, aortogram, repair of aorta with bifurcated prosthesis, aortic valvuloplasty 9/9- worsening shock, tachy  9/14 Failed SBT. Wheezing. Edema pattern on CXR. Lasix X 1 ordered. BDs changed to scheduled 915. Failed SBT. Wheezing improved  SUBJECTIVE:  RASS -1. + F/C. Failed SBT.   VITAL SIGNS: Temp:  [98.2 F (36.8 C)-98.6 F (37 C)] 98.5 F (36.9 C) (09/16 1222) Pulse Rate:  [81-98] 93 (09/16 1300) Resp:  [17-27] 25 (09/16 1300) BP: (80-173)/(51-85) 109/60 mmHg (09/16 1300) SpO2:  [92 %-97 %] 93 % (09/16 1300) Arterial Line BP: (78-159)/(46-74) 159/74 mmHg (09/16 1300) FiO2 (%):  [40 %-50 %] 40 % (09/16 1255) HEMODYNAMICS:   VENTILATOR SETTINGS: Vent Mode:  [-] PRVC FiO2 (%):  [40 %-50 %] 40 % Set Rate:  [12 bmp] 12 bmp Vt Set:  [580 mL] 580 mL PEEP:  [5 cmH20] 5 cmH20 Plateau Pressure:  [15 cmH20-27 cmH20] 22 cmH20 INTAKE / OUTPUT:  Intake/Output Summary (Last 24 hours) at 11/14/13 1358 Last data filed at 11/14/13 1300  Gross per 24 hour  Intake 2636.78 ml  Output   2150 ml  Net 486.78 ml    PHYSICAL EXAMINATION: General:  NAD HEENT: WNL PULM:  +  exp wheezes CV: RRR, no M AB: firm, distended, absent BS Ext: 4+ symmetric edema in all extremities   LABS: I have reviewed all of today's lab results. Relevant abnormalities are discussed in the A/P section  CXR: NSC edema pattern  ASSESSMENT / PLAN:  PULMONARY OETT 9/8 >>  A:  Prolonged VDRF  COPD Acute bronchospasm - improving Pulm edema P:   Cont full vent support - settings reviewed and/or adjusted Cont vent bundle Daily SBT if/when meets criteria  CARDIOVASCULAR A:  Shock, resolved P:  Monitor CVP goal 10-14 MAP goal > 65 mmHg  RENAL A:  AKI, nonoliguric CKD Severe hypervolemia Very poor candidate for HD of any duration P:   Monitor BMET intermittently Monitor I/Os Correct electrolytes as indicated Lasix X 2 9/16  GASTROINTESTINAL A:  Bowel ischemia s/p AAA repair P:   SUP: IV PPI Cont TPN  HEMATOLOGIC A:   Anemia, no acute blood loss presently Thrombocytopenia P:  DVT px: SCDs Monitor CBC intermittently Transfuse per usual ICU guidelines  INFECTIOUS A:   Markedly elevated PCT (43.5 on 9/16) Leukocytosis SIRS without definitive source of infection P:    9/9 zosyn >>  9/9 vanc >> 9/12  Cont pip-tazo Follow PCT Change CVL Consider CT abdomen repeat  ENDOCRINE A:   Hyperglycemia without prior hx of DM  P:   Cont SSI  NEUROLOGIC A:   ICU associated discomfort/agitation P:   RASS goal: -1 Cont Fentanyl gtt Cont dexmedetomidine Daily WUA  TODAY'S  SUMMARY:  Prognosis for functional recovery seems increasingly worse. Would be reasonable to address goals of care   I have personally obtained a history, examined the patient, evaluated laboratory and imaging results, formulated the assessment and plan and placed orders. CRITICAL CARE: The patient is critically ill with multiple organ systems failure and requires high complexity decision making for assessment and support, frequent evaluation and titration of therapies,  application of advanced monitoring technologies and extensive interpretation of multiple databases. Critical Care Time devoted to patient care services described in this note is 35 minutes.    Billy Fischer, MD ; [facility] [phone].  After 5:30 PM or weekends, call [phone]  11/14/2013  1:58 PM

## 2013-11-15 ENCOUNTER — Inpatient Hospital Stay (HOSPITAL_COMMUNITY): Payer: Medicare Other

## 2013-11-15 DIAGNOSIS — J9601 Acute respiratory failure with hypoxia: Secondary | ICD-10-CM

## 2013-11-15 DIAGNOSIS — R601 Generalized edema: Secondary | ICD-10-CM

## 2013-11-15 DIAGNOSIS — N179 Acute kidney failure, unspecified: Secondary | ICD-10-CM

## 2013-11-15 LAB — COMPREHENSIVE METABOLIC PANEL
ALBUMIN: 1.3 g/dL — AB (ref 3.5–5.2)
ALT: 64 U/L — ABNORMAL HIGH (ref 0–53)
ANION GAP: 16 — AB (ref 5–15)
AST: 48 U/L — AB (ref 0–37)
Alkaline Phosphatase: 114 U/L (ref 39–117)
BILIRUBIN TOTAL: 3.1 mg/dL — AB (ref 0.3–1.2)
BUN: 79 mg/dL — AB (ref 6–23)
CO2: 24 mEq/L (ref 19–32)
CREATININE: 3.49 mg/dL — AB (ref 0.50–1.35)
Calcium: 7.9 mg/dL — ABNORMAL LOW (ref 8.4–10.5)
Chloride: 102 mEq/L (ref 96–112)
GFR calc Af Amer: 17 mL/min — ABNORMAL LOW (ref >90)
GFR calc non Af Amer: 15 mL/min — ABNORMAL LOW (ref >90)
Glucose, Bld: 138 mg/dL — ABNORMAL HIGH (ref 70–99)
POTASSIUM: 3.8 meq/L (ref 3.7–5.3)
Sodium: 142 mEq/L (ref 137–147)
TOTAL PROTEIN: 4.6 g/dL — AB (ref 6.0–8.3)

## 2013-11-15 LAB — GLUCOSE, CAPILLARY
GLUCOSE-CAPILLARY: 131 mg/dL — AB (ref 70–99)
GLUCOSE-CAPILLARY: 134 mg/dL — AB (ref 70–99)
Glucose-Capillary: 140 mg/dL — ABNORMAL HIGH (ref 70–99)
Glucose-Capillary: 136 mg/dL — ABNORMAL HIGH (ref 70–99)
Glucose-Capillary: 130 mg/dL — ABNORMAL HIGH (ref 70–99)

## 2013-11-15 LAB — PROCALCITONIN: Procalcitonin: 33.31 ng/mL

## 2013-11-15 LAB — MAGNESIUM: Magnesium: 2 mg/dL (ref 1.5–2.5)

## 2013-11-15 LAB — PHOSPHORUS: PHOSPHORUS: 5.5 mg/dL — AB (ref 2.3–4.6)

## 2013-11-15 MED ORDER — FREE WATER: 200.0000 mL | Freq: Three times a day (TID) | Status: DC

## 2013-11-15 MED ORDER — M.V.I. ADULT IV INJ
INTRAVENOUS | Status: AC
  Administered 2013-11-15: 18:00:00 via INTRAVENOUS
  Filled 2013-11-15: qty 2000

## 2013-11-15 MED ORDER — FUROSEMIDE 10 MG/ML IJ SOLN
80.0000 mg | Freq: Four times a day (QID) | INTRAMUSCULAR | Status: AC
  Administered 2013-11-15 (×2): 80 mg via INTRAVENOUS
  Filled 2013-11-15 (×2): qty 8

## 2013-11-15 MED ORDER — FAT EMULSION 20 % IV EMUL
250.0000 mL | INTRAVENOUS | Status: AC
  Administered 2013-11-15: 250 mL via INTRAVENOUS
  Filled 2013-11-15: qty 250

## 2013-11-15 NOTE — Progress Notes (Signed)
ANTIBIOTIC CONSULT NOTE - FOLLOW UP  Pharmacy Consult for Zosyn Indication: intra-abdominal infection  Allergies  Allergen Reactions  . Penicillins     unknown    Patient Measurements: Height:  (177.8 cm) Weight: 209 lb 3.5 oz (94.9 kg) IBW/kg (Calculated) : 73  Vital Signs: Temp: 98.4 F (36.9 C) (09/17 0800) Temp src: Axillary (09/17 0800) BP: 146/66 mmHg (09/17 1150) Pulse Rate: 108 (09/17 1150) Intake/Output from previous day: 09/16 0701 - 09/17 0700 In: 2799.4 [I.V.:1058.7; NG/GT:30; IV Piggyback:150; TPN:1560.7] Out: 3525 [Urine:3425; Emesis/NG output:100] Intake/Output from this shift: Total I/O In: 360 [I.V.:120; NG/GT:30; TPN:210] Out: 575 [Urine:575]  Labs:  Recent Labs  11/13/13 0420 11/13/13 1120 11/14/13 0410 11/15/13 0400  WBC  --  24.1* 26.1*  --   HGB  --  9.0* 8.9*  --   PLT  --  76* 84*  --   CREATININE 3.19*  --  3.27* 3.49*   Estimated Creatinine Clearance: 18.2 ml/min (by C-G formula based on Cr of 3.49). Shawn results found for this basename: VANCOTROUGH, Leodis Binet, VANCORANDOM, GENTTROUGH, GENTPEAK, GENTRANDOM, TOBRATROUGH, TOBRAPEAK, TOBRARND, AMIKACINPEAK, AMIKACINTROU, AMIKACIN,  in the last 72 hours   Microbiology: Recent Results (from the past 720 hour(s))  SURGICAL PCR SCREEN     Status: None   Collection Time    10/30/13  7:52 AM      Result Value Ref Range Status   MRSA, PCR NEGATIVE  NEGATIVE Final   Staphylococcus aureus NEGATIVE  NEGATIVE Final   Comment:            The Xpert SA Assay (FDA     approved for NASAL specimens     in patients over 19 years of age),     is one component of     a comprehensive surveillance     program.  Test performance has     been validated by [employer] for patients greater     than or equal to 34 year old.     It is not intended     to diagnose infection nor to     guide or monitor treatment.  SURGICAL PCR SCREEN     Status: None   Collection Time    11/05/13 10:44 AM   Result Value Ref Range Status   MRSA, PCR NEGATIVE  NEGATIVE Final   Staphylococcus aureus NEGATIVE  NEGATIVE Final   Comment:            The Xpert SA Assay (FDA     approved for NASAL specimens     in patients over 57 years of age),     is one component of     a comprehensive surveillance     program.  Test performance has     been validated by [employer] for patients greater     than or equal to 78 year old.     It is not intended     to diagnose infection nor to     guide or monitor treatment.  CULTURE, RESPIRATORY (NON-EXPECTORATED)     Status: None   Collection Time    11/14/13  9:45 AM      Result Value Ref Range Status   Specimen Description TRACHEAL ASPIRATE   Final   Special Requests Normal   Final   Gram Stain     Final   Value: FEW WBC PRESENT,BOTH PMN AND MONONUCLEAR     FEW SQUAMOUS EPITHELIAL  CELLS PRESENT     Shawn ORGANISMS SEEN     Performed at [facility]   Culture     Final   Value: Shawn GROWTH 1 DAY     Performed at [facility]   Report Status PENDING   Incomplete    Anti-infectives   Start     Dose/Rate Route Frequency Ordered Stop   11/09/13 1600  vancomycin (VANCOCIN) IVPB 750 mg/150 ml premix  Status:  Discontinued     750 mg 150 mL/hr over 60 Minutes Intravenous Every 24 hours 11/09/13 1539 11/10/13 1201   11/08/13 2000  piperacillin-tazobactam (ZOSYN) IVPB 2.25 g     2.25 g 100 mL/hr over 30 Minutes Intravenous 3 times per day 11/08/13 1204     11/08/13 1500  vancomycin (VANCOCIN) IVPB 750 mg/150 ml premix  Status:  Discontinued     750 mg 150 mL/hr over 60 Minutes Intravenous Every 24 hours 11/08/13 1449 11/09/13 1404   11/07/13 1215  vancomycin (VANCOCIN) 250 mg in sodium chloride 0.9 % 100 mL IVPB     250 mg 100 mL/hr over 60 Minutes Intravenous  Once 11/07/13 1209 11/07/13 1559   11/07/13 1200  piperacillin-tazobactam (ZOSYN) IVPB 3.375 g  Status:  Discontinued     3.375 g 12.5 mL/hr over 240 Minutes Intravenous 3 times  per day 11/07/13 1154 11/08/13 1204   11/07/13 1000  vancomycin (VANCOCIN) 500 mg in sodium chloride 0.9 % 100 mL IVPB  Status:  Discontinued     500 mg 100 mL/hr over 60 Minutes Intravenous Every 24 hours 11/07/13 0905 11/07/13 1208   11/05/2013 0615  levofloxacin (LEVAQUIN) IVPB 500 mg     500 mg 100 mL/hr over 60 Minutes Intravenous To Surgery 11/14/2013 0605 11/08/2013 0820   11/02/2013 0600  vancomycin (VANCOCIN) IVPB 1000 mg/200 mL premix  Status:  Discontinued     1,000 mg 200 mL/hr over 60 Minutes Intravenous On call to O.R. 11/05/13 0745 10/30/2013 1450   11/14/2013 0600  vancomycin (VANCOCIN) IVPB 1000 mg/200 mL premix  Status:  Discontinued     1,000 mg 200 mL/hr over 60 Minutes Intravenous To Surgery 10/30/13 0730 11/01/13 1740      Assessment: 80 year old male on Day #9 of Zosyn for intra-abdominal coverage.  His renal dysfunction persists with SCr stable at 3.49 today. Current regimen of Zosyn  2.25 gm IV q8h  remains appropriate.   9/16 Trach aspirate culture NGTD  Plan:  Continue Zosyn 2.25gm IV q8h Follow renal function closely.  F/u cultures.   Noah Delaine, RPh Clinical Pharmacist Pager: [phone] 11/15/2013, 1:40 PM

## 2013-11-15 NOTE — Progress Notes (Signed)
Agree with above, I dont think that surgery would be good for surgical emergency but he is basically unchanged with peritonitis, watchful waiting begun a couple weeks ago, no gut function, think he meets criteria for exploration but likely would not survive, at some point conversation about goals appropriate

## 2013-11-15 NOTE — Progress Notes (Signed)
PULMONARY / CRITICAL CARE MEDICINE   Name: Shawn Mathews MRN: [id number] DOB: 12-07-1927    ADMISSION DATE:  10/09/2013 CONSULTATION DATE:  9/8  REFERRING MD :  Imogene Burn  CHIEF COMPLAINT:  Post op vent management  INITIAL PRESENTATION: 80 y/o male admitted on 9/8 with abdominal pain felt to be due to AAA, found to have critical symptomatic aortic stenosis. Underwent endovascular AAA repair on 9/8 with valvuloplasty so PCCM consulted for post op vent management.  STUDIES:  8/31 CT angio AB > AAA 8.5 cm, tortuous R iliac arterial system, occluded SFA, 17 mm renal mass left 9/4 CT angio heart/chest> emphysema, calcified mitral and aortic valves, calcified trileaflet aortic valve  SIGNIFICANT EVENTS: 9/8 Aortic valvuloplasty, bilat common fem artery cannulation, R iliofemoral endarterectomy, repair left common femoral artery, aortogram, repair of aorta with bifurcated prosthesis, aortic valvuloplasty 9/9- worsening shock, tachy  9/14 Failed SBT. Wheezing. Edema pattern on CXR. Lasix X 1 ordered. BDs changed to scheduled 9/15. Failed SBT. Wheezing improved. Further diuresis  9/17 Tolerated PS 10 cm H2O. Advanced directives discussed  with daughter and son in law. Agreed to continued maximum support for a few more days at most (into early next week). If not substantially improving by then, move to comfort care and vent withdrawal. No CPR/ACLS, no HD, no trach tube. Further diuresis administered  SUBJECTIVE:  RASS -1. + F/C. Failed SBT but tolerates PS 10 cm H2O   VITAL SIGNS: Temp:  [98 F (36.7 C)-99 F (37.2 C)] 98.4 F (36.9 C) (09/17 0800) Pulse Rate:  [89-104] 99 (09/17 1000) Resp:  [17-30] 18 (09/17 1000) BP: (92-148)/(49-89) 103/70 mmHg (09/17 1000) SpO2:  [93 %-98 %] 94 % (09/17 1000) Arterial Line BP: (94-159)/(50-74) 109/56 mmHg (09/17 1000) FiO2 (%):  [40 %-50 %] 40 % (09/17 0815) HEMODYNAMICS:   VENTILATOR SETTINGS: Vent Mode:  [-] PRVC FiO2 (%):  [40 %-50 %] 40 % Set Rate:   [12 bmp] 12 bmp Vt Set:  [580 mL] 580 mL PEEP:  [5 cmH20] 5 cmH20 Pressure Support:  [10 cmH20] 10 cmH20 Plateau Pressure:  [20 cmH20-23 cmH20] 23 cmH20 INTAKE / OUTPUT:  Intake/Output Summary (Last 24 hours) at 11/15/13 1205 Last data filed at 11/15/13 1000  Gross per 24 hour  Intake 2647.59 ml  Output   3575 ml  Net -927.41 ml    PHYSICAL EXAMINATION: General:  NAD HEENT: WNL PULM:  no exp wheezes CV: RRR, no M AB: firm, distended, absent BS Ext: 3-4+ symmetric edema in all extremities   LABS: I have reviewed all of today's lab results. Relevant abnormalities are discussed in the A/P section  CXR: NSC edema pattern  ASSESSMENT / PLAN:  PULMONARY OETT 9/8 >>  A:  Prolonged VDRF  COPD Acute bronchospasm - resolved Pulm edema P:   Cont full vent support - settings reviewed and/or adjusted Cont vent bundle Daily SBT if/when meets criteria Optimize resp status and extubate with plans for no re-intubation Trach tube not an option If cannot pass SBT by first of next week, move towards comfort care  CARDIOVASCULAR A:  Shock, resolved P:  Monitor CVP goal 10-14 MAP goal > 65 mmHg DNR if arrests  RENAL A:  AKI, nonoliguric CKD Severe hypervolemia Very poor candidate for HD of any duration P:   Monitor BMET intermittently Monitor I/Os Correct electrolytes as indicated Lasix X 2 9/17  GASTROINTESTINAL A:  Bowel ischemia s/p AAA repair P:   SUP: IV PPI Cont TPN  HEMATOLOGIC A:  Anemia, no acute blood loss presently Thrombocytopenia P:  DVT px: SCDs Monitor CBC intermittently Transfuse per usual ICU guidelines  INFECTIOUS A:   Markedly elevated PCT (43.5 on 9/16) Leukocytosis SIRS without definitive source of infection P:    9/9 zosyn >>  9/9 vanc >> 9/12  Cont pip-tazo Follow PCT Changed out CVL 9/16  ENDOCRINE A:   Hyperglycemia without prior hx of DM  P:   Cont SSI  NEUROLOGIC A:   ICU associated  discomfort/agitation P:   RASS goal: -1 Cont Fentanyl gtt Cont dexmedetomidine Daily WUA  TODAY'S SUMMARY:     I have personally obtained a history, examined the patient, evaluated laboratory and imaging results, formulated the assessment and plan and placed orders. CRITICAL CARE: The patient is critically ill with multiple organ systems failure and requires high complexity decision making for assessment and support, frequent evaluation and titration of therapies, application of advanced monitoring technologies and extensive interpretation of multiple databases. Critical Care Time devoted to patient care services described in this note is 45 minutes.    Billy Fischer, MD ; [facility] [phone].  After 5:30 PM or weekends, call [phone]  11/15/2013  12:05 PM

## 2013-11-15 NOTE — Progress Notes (Signed)
   Daily Progress Note  Assessment/Planning: POD #9 s/p Valvuloplasty, EVAR for large AAA, Abd pain of unknown etiology   PULM: Vent wean per PCCM, sputum cx in process GI: holding pattern for now FEN: Cont TPN  REN: total fluid overload, autodiuresis in process, UOP picking up ID: on Zosyn  Not certain why pt is slowing improving but likely active GI pathology.  At some point, if this patient continues to improve, he may force a x-lap.  Will have to re-evaluate the risks-benefits analysis at that time.  Subjective  - 9 Days Post-Op  No events overnight  Objective Filed Vitals:   11/15/13 0600 11/15/13 0700 11/15/13 0739 11/15/13 0815  BP: 97/57 115/72  148/64  Pulse: 94 95  100  Temp:      TempSrc:      Resp: Height:      Weight:      SpO2: 94% 95% 95% 94%    Intake/Output Summary (Last 24 hours) at 11/15/13 0832 Last data filed at 11/15/13 0700  Gross per 24 hour  Intake 2699.39 ml  Output   3450 ml  Net -750.61 ml   NEURO non-responsive PULM  B wheezing  CV  RRR  GI  R side TTP, no guarding, min BS REN 3.4 L UOP VASC  Both feet viable  Laboratory CBC    Component Value Date/Time   WBC 26.1* 11/14/2013 0410   HGB 8.9* 11/14/2013 0410   HCT 26.2* 11/14/2013 0410   PLT 84* 11/14/2013 0410    BMET    Component Value Date/Time   NA 142 11/15/2013 0400   K 3.8 11/15/2013 0400   CL 102 11/15/2013 0400   CO2 24 11/15/2013 0400   GLUCOSE 138* 11/15/2013 0400   BUN 79* 11/15/2013 0400   CREATININE 3.49* 11/15/2013 0400   CALCIUM 7.9* 11/15/2013 0400   GFRNONAA 15* 11/15/2013 0400   GFRAA 17* 11/15/2013 0400    [name], MD [facility] Office: [phone] Pager: [phone]  11/15/2013, 8:32 AM

## 2013-11-15 NOTE — Progress Notes (Signed)
PARENTERAL NUTRITION CONSULT NOTE   Pharmacy Consult for TNA  Indication: Possible bowel ischemia   Allergies  Allergen Reactions  . Penicillins     unknown    Patient Measurements: Height:  (177.8 cm) Weight: 209 lb 3.5 oz (94.9 kg) IBW/kg (Calculated) : 73  Vital Signs: Temp: 98 F (36.7 C) (09/17 0400) Temp src: Oral (09/17 0400) BP: 97/57 mmHg (09/17 0600) Pulse Rate: 95 (09/17 0700) Intake/Output from previous day: 09/16 0701 - 09/17 0700 In: 2799.4 [I.V.:1058.7; NG/GT:30; IV Piggyback:150; TPN:1560.7] Out: 3425 [Urine:3425] Intake/Output from this shift:    Labs:  Recent Labs  11/12/13 1249 11/13/13 1120 11/14/13 0410  WBC  --  24.1* 26.1*  HGB 8.5* 9.0* 8.9*  HCT 25.0* 26.7* 26.2*  PLT  --  76* 84*     Recent Labs  11/13/13 0420 11/14/13 0410 11/15/13 0400  NA 141 143 142  K 3.6* 3.9 3.8  CL 103 104 102  CO2 GLUCOSE 131* 139* 138*  BUN 62* 72* 79*  CREATININE 3.19* 3.27* 3.49*  CALCIUM 7.7* 7.7* 7.9*  MG  --  2.0 2.0  PHOS  --  4.8* 5.5*  PROT  --   --  4.6*  ALBUMIN  --   --  1.3*  AST  --   --  48*  ALT  --   --  64*  ALKPHOS  --   --  114  BILITOT  --   --  3.1*   Estimated Creatinine Clearance: 18.2 ml/min (by C-G formula based on Cr of 3.49).    Recent Labs  11/14/13 1936 11/14/13 2350 11/15/13 0404  GLUCAP 120* 122* 131*    Medical History: Past Medical History  Diagnosis Date  . Hypertension   . Gout   . Glaucoma   . COPD (chronic obstructive pulmonary disease)     Medications:  Prescriptions prior to admission  Medication Sig Dispense Refill  . allopurinol (ZYLOPRIM) 100 MG tablet Take by mouth daily.      Marland Kitchen amLODipine (NORVASC) 5 MG tablet Take 5 mg by mouth daily.      . dorzolamide (TRUSOPT) 2 % ophthalmic solution Place 1 drop into both eyes 2 (two) times daily.       . traMADol (ULTRAM) 50 MG tablet Take 50 mg by mouth every 6 (six) hours as needed for moderate pain.         Insulin  Requirements in the past 24 hours:  4 unit, On SSI   Assessment: 62 YOM admitted with abdominal pain due to suspected bowel ischemia. Pt is malnourished due to inadequate oral intake prior to admission. Since admission patient has been eating 100% of meals. Pt is currently NPO.   GI: Watershed bowel ischemia s/p AAA repair. Not sure if pt would survive colon resection Endo: No hx of DM, CBGs controlled  Lytes: K 3.8, Na 142, mag 2.0, phos 5.5, CoCa 10  CoCa x phos =55 Renal: SCr 3.49, UOP 1.5 mL/kg/hr, lasix 80 IV X 2 given   Weight up significantly since admission Pulm: On ventilator. FiO2 40% Cards: H/o HTN, VSS Hepatobil: transaminases elevated, t bili 3.1  TG 122 ID: Intra-abdominal infx,potential pseudomonas infection at R IJ   WBC elevated at 26.1, On day #9 of abx   PCT 33.31 Best Practices: SCDs, Protonix IV  TPN Access: CVC Rt internal Jugular  TPN day#: 4  Current Nutrition:  Clinimix E 5/15 at 60 ml/hr, lipids @ 10 ml/hr  Goal rate 90 ml/hr  Nutritional Goals Per RD:  Kcal: 2000-2150 Protein: 100-110 gm  Plan:  -Change Clinimix E 5/15 to NO electrolytes due to elevated Phos X CoCa  -Cont rate at 60 ml/hr with lipids at 10 ml/hr -Cont MVI daily and cont TE  MWF -Advance TPN when fluid status improves  -f/u am labs  Talbert Cage, PharmD.  Clinical Pharmacist Pager [phone]

## 2013-11-16 ENCOUNTER — Inpatient Hospital Stay (HOSPITAL_COMMUNITY): Payer: Medicare Other

## 2013-11-16 DIAGNOSIS — N179 Acute kidney failure, unspecified: Secondary | ICD-10-CM

## 2013-11-16 DIAGNOSIS — R609 Edema, unspecified: Secondary | ICD-10-CM

## 2013-11-16 LAB — GLUCOSE, CAPILLARY
GLUCOSE-CAPILLARY: 122 mg/dL — AB (ref 70–99)
GLUCOSE-CAPILLARY: 124 mg/dL — AB (ref 70–99)
Glucose-Capillary: 120 mg/dL — ABNORMAL HIGH (ref 70–99)
Glucose-Capillary: 126 mg/dL — ABNORMAL HIGH (ref 70–99)
Glucose-Capillary: 131 mg/dL — ABNORMAL HIGH (ref 70–99)

## 2013-11-16 LAB — BASIC METABOLIC PANEL
Anion gap: 17 — ABNORMAL HIGH (ref 5–15)
BUN: 88 mg/dL — ABNORMAL HIGH (ref 6–23)
CHLORIDE: 101 meq/L (ref 96–112)
CO2: 25 meq/L (ref 19–32)
Calcium: 7.8 mg/dL — ABNORMAL LOW (ref 8.4–10.5)
Creatinine, Ser: 3.77 mg/dL — ABNORMAL HIGH (ref 0.50–1.35)
GFR calc Af Amer: 16 mL/min — ABNORMAL LOW (ref >90)
GFR calc non Af Amer: 13 mL/min — ABNORMAL LOW (ref >90)
GLUCOSE: 138 mg/dL — AB (ref 70–99)
POTASSIUM: 3.3 meq/L — AB (ref 3.7–5.3)
SODIUM: 143 meq/L (ref 137–147)

## 2013-11-16 LAB — CBC
HEMATOCRIT: 25.8 % — AB (ref 39.0–52.0)
Hemoglobin: 8.8 g/dL — ABNORMAL LOW (ref 13.0–17.0)
MCH: 30.7 pg (ref 26.0–34.0)
MCHC: 34.1 g/dL (ref 30.0–36.0)
MCV: 89.9 fL (ref 78.0–100.0)
PLATELETS: 134 10*3/uL — AB (ref 150–400)
RBC: 2.87 MIL/uL — AB (ref 4.22–5.81)
RDW: 16.2 % — ABNORMAL HIGH (ref 11.5–15.5)
WBC: 23.5 10*3/uL — AB (ref 4.0–10.5)

## 2013-11-16 LAB — CULTURE, RESPIRATORY W GRAM STAIN: Culture: NO GROWTH

## 2013-11-16 LAB — CULTURE, RESPIRATORY: SPECIAL REQUESTS: NORMAL

## 2013-11-16 MED ORDER — MIDAZOLAM HCL 2 MG/2ML IJ SOLN
2.0000 mg | INTRAMUSCULAR | Status: DC | PRN
  Administered 2013-11-16 – 2013-11-17 (×4): 2 mg via INTRAVENOUS
  Administered 2013-11-21: 0.5 mg via INTRAVENOUS
  Filled 2013-11-16 (×3): qty 2

## 2013-11-16 MED ORDER — POTASSIUM CHLORIDE 10 MEQ/50ML IV SOLN
10.0000 meq | INTRAVENOUS | Status: AC
  Administered 2013-11-16 (×2): 10 meq via INTRAVENOUS
  Filled 2013-11-16 (×2): qty 50

## 2013-11-16 MED ORDER — FAT EMULSION 20 % IV EMUL
250.0000 mL | INTRAVENOUS | Status: AC
  Administered 2013-11-16: 250 mL via INTRAVENOUS
  Filled 2013-11-16: qty 250

## 2013-11-16 MED ORDER — POTASSIUM CHLORIDE 20 MEQ/15ML (10%) PO LIQD
20.0000 meq | Freq: Once | ORAL | Status: DC
  Filled 2013-11-16: qty 15

## 2013-11-16 MED ORDER — TRACE MINERALS CR-CU-F-FE-I-MN-MO-SE-ZN IV SOLN
INTRAVENOUS | Status: AC
  Administered 2013-11-16: 18:00:00 via INTRAVENOUS
  Filled 2013-11-16: qty 2000

## 2013-11-16 NOTE — Progress Notes (Signed)
PARENTERAL NUTRITION CONSULT NOTE   Pharmacy Consult for TNA  Indication: Possible bowel ischemia   Allergies  Allergen Reactions  . Penicillins     unknown    Patient Measurements: Height:  (177.8 cm) Weight: 201 lb 1 oz (91.2 kg) IBW/kg (Calculated) : 73  Vital Signs: Temp: 98 F (36.7 C) (09/18 0402) Temp src: Oral (09/18 0402) BP: 103/57 mmHg (09/18 0731) Pulse Rate: 93 (09/18 0731) Intake/Output from previous day: 09/17 0701 - 09/18 0700 In: 2787.5 [I.V.:880; NG/GT:280; IV Piggyback:87.5; TPN:1540] Out: 4475 [Urine:4275; Emesis/NG output:200] Intake/Output from this shift: Total I/O In: -  Out: 275 [Urine:275]  Labs:  Recent Labs  11/13/13 1120 11/14/13 0410 11/16/13 0430  WBC 24.1* 26.1* 23.5*  HGB 9.0* 8.9* 8.8*  HCT 26.7* 26.2* 25.8*  PLT 76* 84* 134*     Recent Labs  11/14/13 0410 11/15/13 0400 11/16/13 0430  NA 143 142 143  K 3.9 3.8 3.3*  CL 104 102 101  CO2 GLUCOSE 139* 138* 138*  BUN 72* 79* 88*  CREATININE 3.27* 3.49* 3.77*  CALCIUM 7.7* 7.9* 7.8*  MG 2.0 2.0  --   PHOS 4.8* 5.5*  --   PROT  --  4.6*  --   ALBUMIN  --  1.3*  --   AST  --  48*  --   ALT  --  64*  --   ALKPHOS  --  114  --   BILITOT  --  3.1*  --    Estimated Creatinine Clearance: 16.6 ml/min (by C-G formula based on Cr of 3.77).    Recent Labs  11/15/13 1918 11/16/13 0004 11/16/13 0350  GLUCAP 130* 120* 126*    Insulin Requirements in the past 24 hours:  2 unit, On SSI   Assessment: 63 YOM admitted with abdominal pain due to suspected bowel ischemia. Pt is malnourished due to inadequate oral intake prior to admission. Marland Kitchen POD # 10 s/p AAA repair with ischemic bowel.   GI: Watershed bowel ischemia s/p AAA repair. Not sure if pt would survive colon resection Endo: No hx of DM, CBGs controlled  Lytes: lytes taken out of TPN last evening; K 3.3, Na 143,  yesterday's phos 5.5, CoCa 9.96  CoCa x phos =55 Renal: SCr 3.77, UOP 2 mL/kg/hr, lasix  80 IV X 2 given   Weight up significantly since admission. No further diuresis per CCM. Severe hypervolemia - improving some with diuresis. Not a candidate for HD.   Hepatobil: transaminases elevated, t bili 3.1  TG 122 TPN Access: CVC Rt internal Jugular  TPN day#: 6 Current Nutrition:  Clinimix  5/15 at 60 ml/hr, lipids @ 10 ml/hr provides 72 gm protein and 1502 kcals Goal rate 90 ml/hr  Nutritional Goals Per RD:  Kcal: 2000-2150 Protein: 100-110 gm  Plan:  -add electrolytes back to TPN today. KCL 20 per tube x 1 dose per CCM -Clinimix E 5/15 at 60 ml/hr with lipids at 10 ml/hr -Cont MVI daily and cont TE  MWF -advance TPN when fluid status improves -f/u am bmet, mag and phos Herby Abraham, Pharm.D. [phone] 11/16/2013 8:46 AM

## 2013-11-16 NOTE — Progress Notes (Signed)
PULMONARY / CRITICAL CARE MEDICINE   Name: Shawn Mathews [id number] DOB: [birth year]    ADMISSION DATE:  09/30/2013 CONSULTATION DATE:  9/8  REFERRING MD :  Imogene Burn  CHIEF COMPLAINT:  Post op vent management  INITIAL PRESENTATION: 80 y/o male admitted on 9/8 with abdominal pain felt to be due to AAA, found to have critical symptomatic aortic stenosis. Underwent endovascular AAA repair on 9/8 with valvuloplasty so PCCM consulted for post op vent management.  STUDIES:  8/31 CT angio AB > AAA 8.5 cm, tortuous R iliac arterial system, occluded SFA, 17 mm renal mass left 9/4 CT angio heart/chest> emphysema, calcified mitral and aortic valves, calcified trileaflet aortic valve  SIGNIFICANT EVENTS: 9/8 Aortic valvuloplasty, bilat common fem artery cannulation, R iliofemoral endarterectomy, repair left common femoral artery, aortogram, repair of aorta with bifurcated prosthesis, aortic valvuloplasty 9/9- worsening shock, tachy  9/14 Failed SBT. Wheezing. Edema pattern on CXR. Lasix X 1 ordered. BDs changed to scheduled 9/15. Failed SBT. Wheezing improved. Further diuresis  9/17 Tolerated PS 10 cm H2O. Advanced directives discussed  with daughter and son in law. Agreed to continued maximum support for a few more days at most (into early next week). If not substantially improving by then, move to comfort care and vent withdrawal. No CPR/ACLS, no HD, no trach tube. Further diuresis administered 9/18 Tolerates PS 5 cm H2O for brief stints. Cr rising. No further diuresis  SUBJECTIVE:  RASS -1. + F/C.   VITAL SIGNS: Temp:  [97.7 F (36.5 C)-98.5 F (36.9 C)] 98 F (36.7 C) (09/18 0402) Pulse Rate:  [89-108] 93 (09/18 0731) Resp:  [17-26] 26 (09/18 0731) BP: (92-146)/(50-87) 103/57 mmHg (09/18 0731) SpO2:  [90 %-98 %] 95 % (09/18 0731) Arterial Line BP: (100-157)/(45-70) 120/54 mmHg (09/18 0700) FiO2 (%):  [40 %] 40 % (09/18 0731) Weight:  [91.2 kg (201 lb 1 oz)] 91.2 kg (201 lb 1 oz) (09/18  0500) HEMODYNAMICS:   VENTILATOR SETTINGS: Vent Mode:  [-] PRVC FiO2 (%):  [40 %] 40 % Set Rate:  [12 bmp] 12 bmp Vt Set:  [580 mL] 580 mL PEEP:  [5 cmH20] 5 cmH20 Plateau Pressure:  [17 cmH20-24 cmH20] 17 cmH20 INTAKE / OUTPUT:  Intake/Output Summary (Last 24 hours) at 11/16/13 0830 Last data filed at 11/16/13 0800  Gross per 24 hour  Intake 2647.5 ml  Output   4425 ml  Net -1777.5 ml    PHYSICAL EXAMINATION: General:  NAD HEENT: WNL PULM:  no exp wheezes CV: RRR, no M AB: firm, distended, absent BS Ext: 3+ symmetric edema in all extremities   LABS: I have reviewed all of today's lab results. Relevant abnormalities are discussed in the A/P section  CXR: Slight decrease in the effusions. Improved aeration at the lung bases   ASSESSMENT / PLAN:  PULMONARY ETT 9/8 >>   A:  Prolonged VDRF  COPD Acute bronchospasm - resolved Pulm edema P:   Cont vent support - settings reviewed and/or adjusted Wean in PSV as tolerated Cont vent bundle Daily SBT if/when meets criteria Optimize resp status and extubate with plans for no re-intubation Trach tube not an option If cannot pass SBT by first of next week, move towards comfort care  CARDIOVASCULAR A:  Shock, resolved P:  Monitor CVP goal 10-14 MAP goal > 65 mmHg DNR if arrests  RENAL A:  AKI, nonoliguric - Cr rising with diuresis CKD Severe hypervolemia - improving some with diuresis Not a candidate for HD P:  Monitor BMET intermittently Monitor I/Os Correct electrolytes as indicated No further diuresis planned  GASTROINTESTINAL A:  Bowel ischemia s/p AAA repair P:   SUP: IV PPI Cont TPN  HEMATOLOGIC A:   Anemia, no acute blood loss presently Thrombocytopenia P:  DVT px: SCDs Monitor CBC intermittently Transfuse per usual ICU guidelines  INFECTIOUS A:   Markedly elevated PCT (43.5 on 9/16) Leukocytosis SIRS without definitive source of infection P:    9/9 zosyn >>  9/9 vanc >>  9/12  Cont pip-tazo Follow PCT Changed out CVL 9/16  ENDOCRINE A:   Hyperglycemia without prior hx of DM, controlled  P:   Cont SSI  NEUROLOGIC A:   ICU associated discomfort/agitation P:   RASS goal: -1 Cont Fentanyl gtt Cont dexmedetomidine Daily WUA  TODAY'S SUMMARY:  No family @ bedside. Plan for WE: work on weaning. Once extubated, DNI. If not ready for extubation first of next week, move towards comfort and vent w/d   I have personally obtained a history, examined the patient, evaluated laboratory and imaging results, formulated the assessment and plan and placed orders. CRITICAL CARE: The patient is critically ill with multiple organ systems failure and requires high complexity decision making for assessment and support, frequent evaluation and titration of therapies, application of advanced monitoring technologies and extensive interpretation of multiple databases. Critical Care Time devoted to patient care services described in this note is 35 minutes.    Billy Fischer, MD ; [facility] [phone].  After 5:30 PM or weekends, call [phone]  11/16/2013  8:30 AM

## 2013-11-16 NOTE — Progress Notes (Signed)
   Daily Progress Note  Assessment/Planning: POD #10 s/p Valvuloplasty, EVAR for large AAA, Abd pain of unknown etiology    PULM: Vent wean per PCCM, sputum cx in process   GI: holding pattern for now   FEN: Cont TPN   REN: total fluid overload, good response to IV lasix  ID: on Zosyn  Overall, I agree with PCCM that long-term planning will need to be discussed with the family as two weeks is coming up.  I suspect this patient would NOT have wanted extended intubation and tracheostomy.    Subjective  - 10 Days Post-Op  No significant event overnight, better tolerance of PS  Objective Filed Vitals:   11/16/13 0500 11/16/13 0600 11/16/13 0700 11/16/13 0731  BP: 113/73 98/52 103/57 103/57  Pulse: 91 94 89 93  Temp:      TempSrc:      Resp: Height:      Weight: 201 lb 1 oz (91.2 kg)     SpO2: 95% 94% 95% 95%    Intake/Output Summary (Last 24 hours) at 11/16/13 0829 Last data filed at 11/16/13 0800  Gross per 24 hour  Intake 2647.5 ml  Output   4425 ml  Net -1777.5 ml   NEURO non-responsive  PULM  B wheezing , sputum cx: no growth to date Vent Mode:  [-] PRVC FiO2 (%):  [40 %] 40 % Set Rate:  [12 bmp] 12 bmp Vt Set:  [580 mL] 580 mL PEEP:  [5 cmH20] 5 cmH20 Plateau Pressure:  [17 cmH20-24 cmH20] 17 cmH20  CV  RRR  GI  R side TTP, no guarding, min BS  REN  4.2 L UOP  VASC  Both feet viable but L foot more dusky today   Laboratory CBC    Component Value Date/Time   WBC 23.5* 11/16/2013 0430   HGB 8.8* 11/16/2013 0430   HCT 25.8* 11/16/2013 0430   PLT 134* 11/16/2013 0430    BMET    Component Value Date/Time   NA 143 11/16/2013 0430   K 3.3* 11/16/2013 0430   CL 101 11/16/2013 0430   CO2 25 11/16/2013 0430   GLUCOSE 138* 11/16/2013 0430   BUN 88* 11/16/2013 0430   CREATININE 3.77* 11/16/2013 0430   CALCIUM 7.8* 11/16/2013 0430   GFRNONAA 13* 11/16/2013 0430   GFRAA 16* 11/16/2013 0430    [name], MD [facility] Office: [phone] Pager: [phone]  11/16/2013, 8:29 AM

## 2013-11-17 ENCOUNTER — Inpatient Hospital Stay (HOSPITAL_COMMUNITY): Payer: Medicare Other

## 2013-11-17 DIAGNOSIS — Z66 Do not resuscitate: Secondary | ICD-10-CM

## 2013-11-17 LAB — CBC
HCT: 26.1 % — ABNORMAL LOW (ref 39.0–52.0)
Hemoglobin: 8.9 g/dL — ABNORMAL LOW (ref 13.0–17.0)
MCH: 30.9 pg (ref 26.0–34.0)
MCHC: 34.1 g/dL (ref 30.0–36.0)
MCV: 90.6 fL (ref 78.0–100.0)
PLATELETS: 162 10*3/uL (ref 150–400)
RBC: 2.88 MIL/uL — ABNORMAL LOW (ref 4.22–5.81)
RDW: 16.1 % — AB (ref 11.5–15.5)
WBC: 21.3 10*3/uL — AB (ref 4.0–10.5)

## 2013-11-17 LAB — MAGNESIUM: MAGNESIUM: 1.9 mg/dL (ref 1.5–2.5)

## 2013-11-17 LAB — BASIC METABOLIC PANEL
Anion gap: 17 — ABNORMAL HIGH (ref 5–15)
BUN: 93 mg/dL — ABNORMAL HIGH (ref 6–23)
CALCIUM: 7.8 mg/dL — AB (ref 8.4–10.5)
CHLORIDE: 101 meq/L (ref 96–112)
CO2: 25 meq/L (ref 19–32)
CREATININE: 3.71 mg/dL — AB (ref 0.50–1.35)
GFR calc non Af Amer: 14 mL/min — ABNORMAL LOW (ref >90)
GFR, EST AFRICAN AMERICAN: 16 mL/min — AB (ref >90)
Glucose, Bld: 110 mg/dL — ABNORMAL HIGH (ref 70–99)
Potassium: 3.3 mEq/L — ABNORMAL LOW (ref 3.7–5.3)
Sodium: 143 mEq/L (ref 137–147)

## 2013-11-17 LAB — GLUCOSE, CAPILLARY
GLUCOSE-CAPILLARY: 111 mg/dL — AB (ref 70–99)
GLUCOSE-CAPILLARY: 116 mg/dL — AB (ref 70–99)
Glucose-Capillary: 124 mg/dL — ABNORMAL HIGH (ref 70–99)
Glucose-Capillary: 106 mg/dL — ABNORMAL HIGH (ref 70–99)
Glucose-Capillary: 101 mg/dL — ABNORMAL HIGH (ref 70–99)

## 2013-11-17 LAB — PHOSPHORUS: PHOSPHORUS: 6 mg/dL — AB (ref 2.3–4.6)

## 2013-11-17 LAB — PROCALCITONIN: Procalcitonin: 17.89 ng/mL

## 2013-11-17 MED ORDER — M.V.I. ADULT IV INJ
INTRAVENOUS | Status: AC
  Administered 2013-11-17: 18:00:00 via INTRAVENOUS
  Filled 2013-11-17: qty 2000

## 2013-11-17 MED ORDER — POTASSIUM CHLORIDE 10 MEQ/50ML IV SOLN
10.0000 meq | INTRAVENOUS | Status: AC
  Administered 2013-11-17 (×3): 10 meq via INTRAVENOUS
  Filled 2013-11-17: qty 50

## 2013-11-17 MED ORDER — MIDAZOLAM HCL 5 MG/ML IJ SOLN
2.0000 mg/h | INTRAMUSCULAR | Status: DC
  Administered 2013-11-17 – 2013-11-20 (×3): 1 mg/h via INTRAVENOUS
  Administered 2013-11-21: 0.5 mg/h via INTRAVENOUS
  Administered 2013-11-21: 1 mg/h via INTRAVENOUS
  Filled 2013-11-17 (×4): qty 10

## 2013-11-17 NOTE — Progress Notes (Signed)
PULMONARY / CRITICAL CARE MEDICINE   Name: Shawn Mathews MRN: [id number] DOB: 07-Sep-1927    ADMISSION DATE:  10/25/2013 CONSULTATION DATE:  9/8  REFERRING MD :  Imogene Burn  CHIEF COMPLAINT:  Post op vent management  INITIAL PRESENTATION: 80 y/o male admitted on 9/8 with abdominal pain felt to be due to AAA, found to have critical symptomatic aortic stenosis. Underwent endovascular AAA repair on 9/8 with valvuloplasty so PCCM consulted for post op vent management.  STUDIES:  8/31 CT angio AB > AAA 8.5 cm, tortuous R iliac arterial system, occluded SFA, 17 mm renal mass left 9/04 CT angio heart/chest> emphysema, calcified mitral and aortic valves, calcified trileaflet aortic valve  SIGNIFICANT EVENTS: 9/08 Aortic valvuloplasty, bilat common fem artery cannulation, R iliofemoral endarterectomy, repair left common femoral artery, aortogram, repair of aorta with bifurcated prosthesis, aortic valvuloplasty 9/09 worsening shock, tachy  9/14 Failed SBT. Wheezing. Edema pattern on CXR. Lasix X 1 ordered. BDs changed to scheduled 9/15. Failed SBT. Wheezing improved. Further diuresis  9/17 Tolerated PS 10 cm H2O. Advanced directives discussed  with daughter and son in law. Agreed to continued maximum support for a few more days at most (into early next week). If not substantially improving by then, move to comfort care and vent withdrawal. No CPR/ACLS, no HD, no trach tube. Further diuresis administered 9/18 Tolerates PS 5 cm H2O for brief stints. Cr rising. No further diuresis  SUBJECTIVE:  Did not tolerate SBT >> tachypnea.  VITAL SIGNS: Temp:  [97.3 F (36.3 C)-98.6 F (37 C)] 97.5 F (36.4 C) (09/19 0400) Pulse Rate:  [80-101] 83 (09/19 0600) Resp:  [17-31] 21 (09/19 0600) BP: (90-130)/(47-77) 103/58 mmHg (09/19 0600) SpO2:  [92 %-99 %] 97 % (09/19 0600) Arterial Line BP: (98)/(48) 98/48 mmHg (09/18 0800) FiO2 (%):  [40 %] 40 % (09/19 0425) Weight:  [201 lb 1 oz (91.2 kg)-208 lb 5.4 oz (94.5  kg)] 208 lb 5.4 oz (94.5 kg) (09/19 0500) VENTILATOR SETTINGS: Vent Mode:  [-] PRVC FiO2 (%):  [40 %] 40 % Set Rate:  [12 bmp] 12 bmp Vt Set:  [580 mL] 580 mL PEEP:  [5 cmH20] 5 cmH20 Plateau Pressure:  [16 cmH20-20 cmH20] 18 cmH20 INTAKE / OUTPUT:  Intake/Output Summary (Last 24 hours) at 11/17/13 0700 Last data filed at 11/17/13 0600  Gross per 24 hour  Intake   2920 ml  Output   2550 ml  Net    370 ml    PHYSICAL EXAMINATION: General: ill appearing HEENT: ETT in place PULM:  No wheeze CV: regular, 3/6 SM AB: diffusely tender, +bowel sounds Ext: anasarca   LABS: CBC Recent Labs     11/16/13  0430  11/17/13  0500  WBC  23.5*  21.3*  HGB  8.8*  8.9*  HCT  25.8*  26.1*  PLT  134*  162    BMET Recent Labs     11/15/13  0400  11/16/13  0430  11/17/13  0500  NA  142  143  143  K  3.8  3.3*  3.3*  CL  102  101  101  CO2  BUN  79*  88*  93*  CREATININE  3.49*  3.77*  3.71*  GLUCOSE  138*  138*  110*    Electrolytes Recent Labs     11/15/13  0400  11/16/13  0430  11/17/13  0500  CALCIUM  7.9*  7.8*  7.8*  MG  2.0   --  1.9  PHOS  5.5*   --   6.0*    Sepsis Markers Recent Labs     11/15/13  0400  11/17/13  0500  PROCALCITON  33.31  17.89    Liver Enzymes Recent Labs     11/15/13  0400  AST  48*  ALT  64*  ALKPHOS  114  BILITOT  3.1*  ALBUMIN  1.3*    Glucose Recent Labs     11/16/13  0350  11/16/13  0833  11/16/13  1249  11/16/13  1644  11/17/13  0100  11/17/13  0414  GLUCAP  126*  122*  131*  124*  116*  124*    Imaging Dg Chest Port 1 View  11/16/2013   CLINICAL DATA:  Respiratory failure.  EXAM: PORTABLE CHEST - 1 VIEW  COMPARISON:  11/15/2013 and 11/14/2013  FINDINGS: Endotracheal tube, central venous catheter, and NG tube appear unchanged and in good position.  There is improved aeration at both lung bases with decrease in the bilateral small pleural effusions.  IMPRESSION: Slight decrease in the effusions.  Improved aeration at the lung bases.   Electronically Signed   By: Geanie Cooley M.D.   On: 11/16/2013 08:13   Dg Chest Port 1 View  11/15/2013   CLINICAL DATA:  Respiratory failure  EXAM: PORTABLE CHEST - 1 VIEW  COMPARISON:  11/14/2013  FINDINGS: Endotracheal tube with the tip 5.6 cm above the carina. Nasogastric tube coursing below the diaphragm. Left jugular central venous catheter with the tip projecting over the SVC.  Bilateral small pleural effusions. Bibasilar atelectasis. Mild bilateral interstitial thickening. No pneumothorax. Stable cardiomediastinal silhouette. Unremarkable osseous structures.  IMPRESSION: Bilateral small pleural effusions. Support lines and tubing in satisfactory position.   Electronically Signed   By: Elige Ko   On: 11/15/2013 08:09    ASSESSMENT / PLAN:  PULMONARY ETT 9/8 >>   A:  Prolonged VDRF.  AECOPD - resolved. Pulmonary edema. P:   Wean on pressure support as tolerated F/u CXR Try to optimize for extubation >> once extubated, then would not re-intubate; family does not want trach Continue scheduled BD's  CARDIOVASCULAR Lt IJ CVL 9/16 >>  A:  Hemorrhagic/cardiogenic shock >> resolved. P:  Monitor hemodynamics DNR if arrests  RENAL A:  AKI, nonoliguric 2nd to hypotension and diuresis. CKD. Severe hypervolemia - Not a candidate for HD. Hypokalemia. P:   Monitor renal fx, urine outpt F/u and replace electrolytes as needed  GASTROINTESTINAL A:  Bowel ischemia s/p AAA repair. Protein calorie malnutrition. P:   Protonix for SUP Continue TPN  HEMATOLOGIC A:   Anemia of critical illness and chronic disease. P:  F/u CBC SCD's for DVT prevention >> defer to VVS whether pt can be started on lovenox  INFECTIOUS A:   Sepsis with elevated procalcitonin (43.5 on 9/16) >> source unclear, but likely abdominal. P:   Day 11 zosyn, started 9/09 Trend procalcitonin  ENDOCRINE A:   Hyperglycemia without prior hx of DM, controlled  P:    SSI  NEUROLOGIC A:   ICU associated discomfort/agitation P:   RASS goal: -1 Continue Fentanyl gtt, dexmedetomidine Daily WUA  Goals of Care >> DNR  TODAY'S SUMMARY:  Concern he has sepsis, but source unclear >> most likely abdominal.  Might need repeat CT abd/pelvis to further assess.  No family at bedside.  CC time 35 minutes.  Coralyn Helling, MD [facility] Pulmonary/Critical Care 11/17/2013, 7:02 AM Pager:  [phone] After 3pm call: [phone]

## 2013-11-17 NOTE — Progress Notes (Signed)
Subjective: Interval History: none.. sedated on the vent. Not following commands  Objective: Vital signs in last 24 hours: Temp:  [97.3 F (36.3 C)-98.6 F (37 C)] 98.6 F (37 C) (09/19 0700) Pulse Rate:  [80-101] 91 (09/19 0700) Resp:  [17-31] 27 (09/19 0700) BP: (90-130)/(47-77) 125/71 mmHg (09/19 0700) SpO2:  [92 %-99 %] 98 % (09/19 0828) FiO2 (%):  [40 %] 40 % (09/19 0828) Weight:  [201 lb 1 oz (91.2 kg)-208 lb 5.4 oz (94.5 kg)] 208 lb 5.4 oz (94.5 kg) (09/19 0500)  Intake/Output from previous day: 09/18 0701 - 09/19 0700 In: 2920 [I.V.:1040; NG/GT:120; IV Piggyback:150; TPN:1610] Out: 2550 [Urine:2550] Intake/Output this shift: Total I/O In: 30 [NG/GT:30] Out: 125 [Urine:125]  Abdomen soft. Lower extremity is well perfused  Lab Results:  Recent Labs  11/16/13 0430 11/17/13 0500  WBC 23.5* 21.3*  HGB 8.8* 8.9*  HCT 25.8* 26.1*  PLT 134* 162   BMET  Recent Labs  11/16/13 0430 11/17/13 0500  NA 143 143  K 3.3* 3.3*  CL 101 101  CO2 25 25  GLUCOSE 138* 110*  BUN 88* 93*  CREATININE 3.77* 3.71*  CALCIUM 7.8* 7.8*    Studies/Results: Ct Abdomen Pelvis Wo Contrast  11/07/2013   CLINICAL DATA:  Status post aortic endograft placement on 11/14/2013. Clinical concern for bowel ischemia.  EXAM: CT ABDOMEN AND PELVIS WITHOUT CONTRAST  TECHNIQUE: Multidetector CT imaging of the abdomen and pelvis was performed following the standard protocol without IV contrast.  COMPARISON:  09/30/2013.  FINDINGS: Lung Bases: Emphysema with bilateral lower lobe collapse/ consolidation, left greater than right. Contrast material in the lower esophagus may be related to reflux or dysmotility.  Liver:  Normal uninfused appearance.  Spleen: Normal on infused features.  Stomach: Distended. NG tube tip is in the fundus of the stomach. No gastric wall thickening.  Pancreas: No mass lesion is evident. No dilatation of the main duct.  Gallbladder/Biliary: High attenuation material in the lumen  compatible with vicarious excretion of contrast. No intra or extrahepatic biliary duct dilatation.  Kidneys/Adrenals: No adrenal nodule or mass. Residual contrast material is identified in the cortices of both kidneys in a heterogeneous configuration.  Bowel Loops: Duodenum is normally positioned as is the ligament of Treitz. No evidence for small bowel dilatation. The mid and distal small bowel is opacified in this along with lack of intravenous contrast limits assessment for bowel wall thickening. No gross perienteric edema is evident. There is no evidence for small bowel pneumatosis. There is some pericecal edema/inflammation although no substantial cecal wall thickening is evident. No evidence for colonic pneumatosis. Diverticular changes are noted in the sigmoid colon.  Nodes: No evidence for lymphadenopathy to in the abdomen or pelvis  Vasculature: Aortic endo graft noted within large abdominal aortic aneurysm measuring up to 8.6 cm in diameter. Gas within the lumen of the aorta and external to the stent graft is most likely related to the recent stent placement.  Pelvic Genitourinary: Streak artifact through the lower pelvis from the patient's bilateral hip replacements obscures lower pelvic anatomy.  Bones/Musculoskeletal: Bilateral hip replacement noted. No worrisome lytic or sclerotic osseous abnormality  Body Wall: No evidence for abdominal wall hernia. There is body wall edema in the lower abdomen and pelvis.  Other: There may be a trace amount of free fluid in the pelvis.  IMPRESSION: No definite CT evidence for bowel ischemia. Study is limited by incomplete opacification of small and large bowel and lack of intravenous contrast material, but  no pneumatosis is evident. There is a small amount of edema or inflammation around the cecum, but no associated cecal wall thickening or pneumatosis is evident.   Electronically Signed   By: Kennith Center M.D.   On: 11/07/2013 14:40   Dg Chest 2 View  10/30/2013    CLINICAL DATA:  pre op  EXAM: CHEST - 2 VIEW  COMPARISON:  04/15/2010  FINDINGS: Somewhat attenuated peripheral bronchovascular markings and coarse perihilar markings. No focal infiltrate or overt edema. Heart size normal. No effusion. Spondylitic changes at several contiguous levels in the mid thoracic spine.  IMPRESSION: 1. Stable chronic interstitial changes.  No acute disease.   Electronically Signed   By: Oley Balm M.D.   On: 10/30/2013 01:47   US Abdomen Complete  10/07/2013   ADDENDUM REPORT: 10/20/2013 12:47  ADDENDUM: Findings and recommendations were discussed with Dr. Shana Chute at 12:40 p.m. As patient has no abdominal pain, the findings are likely stable/chronic. Further evaluation with CT was recommended on an urgent, but not emergent basis.   Electronically Signed   By: Elberta Fortis M.D.   On: 10/12/2013 12:47   10/07/2013   CLINICAL DATA:  Acute abdominal pain.  EXAM: ULTRASOUND ABDOMEN COMPLETE  COMPARISON:  None.  FINDINGS: Exam somewhat limited due to patient body habitus and abundant overlying bowel gas.  Gallbladder:  No gallstones or wall thickening visualized. No sonographic Murphy sign noted.  Common bile duct:  Diameter: 4 mm.  Liver:  No focal lesion identified. Within normal limits in parenchymal echogenicity.  IVC:  Not visualized.  Pancreas:  Visualized portion unremarkable.  Spleen:  Size and appearance within normal limits.  Right Kidney:  Length: 8.6 cm. Echogenicity within normal limits. No mass or hydronephrosis visualized.  Left Kidney:  Length: 8.7 cm. Echogenicity within normal limits. No mass or hydronephrosis visualized.  Abdominal aorta:  Proximal segment not visualized. Moderate atherosclerotic disease with significant aneurysmal dilatation of the mid to distal abdominal aorta measuring approximately 7.8 x 8.1 cm in its AP and transverse dimensions and extending approximately 10.8 cm in length. There is moderate mural thrombus as the lumen measures approximately 2.2  x 3.2 cm in AP and transverse dimension.  Other findings:  None.  IMPRESSION: Significant aneurysm of the mid to distal abdominal aorta measuring approximately 7.8 x 8.1 cm in its AP and transverse dimensions and extending 10.8 cm in length. Moderate mural thrombus as the lumen measures 2.2 x 3.2 cm. Recommend CT abdomen with and without contrast to exclude contained rupture in this patient with abdominal pain.  These results were called by telephone at the time of interpretation on 10/11/2013 at 11:11 am to Dr. Kevin Fenton SPRUILL's recepeptionist, Frederick Peers, , who verbally acknowledged these results. We are unable to reach Dr. Shana Chute at this time. I therefore called patient directly as patient states he has no known history of abdominal aneurysm and states he has no abdominal pain. We discussed that he needs further evaluation with CT scan and that Dr. Magda Kiel office will be contacting him to scheduled the exam. We discussed that he should proceed to the nearest ER if he experiences abdominal pain.  Electronically Signed: By: Elberta Fortis M.D. On: 10/10/2013 11:26   Ct Cardiac Morph/pulm Vein W/cm&w/o Ca Score  11/02/2013   ADDENDUM REPORT: 11/02/2013 16:42  ADDENDUM: Over-read:  The heart size appears mildly enlarged. There are calcifications involving the thoracic aorta as well as the LAD, left circumflex and RCA coronary arteries. Dense calcifications involving the  aortic an mitral valves noted. No pericardial effusion. There is no enlarged mediastinal or hilar lymph nodes. No enlarged axillary or supraclavicular adenopathy.  There is no pleural effusion identified. There are moderate to advanced changes of centrilobular and paraseptal emphysema noted. Dependent changes are noted in the lung bases. No airspace consolidation identified. Calcified granuloma is identified in the right middle lobe.  Incidental imaging through the upper abdomen shows no acute findings.  Review of the visualized osseous  structures is significant for mild degenerative disc disease within the thoracic spine.  IMPRESSION: 1. Emphysema 2. Atherosclerotic disease including multi vessel coronary artery calcifications. 3. Calcifications of the aortic and mitral valves 4. Prior granulomatous disease.   Electronically Signed   By: Signa Kell M.D.   On: 11/02/2013 16:42   11/02/2013   CLINICAL DATA:  Aorticstenosis  EXAM: Cardiac TAVR CT  TECHNIQUE: The patient was scanned on a Philips [id number] scanner. A 100 kV retrospective scan was triggered in the descending thoracic aorta at 111 HU's. Gantry rotation speed was 270 msecs and collimation was .9 mm. No beta blockade or nitro were given. The 3D data set was reconstructed in 5% intervals of the R-R cycle. Systolic and diastolic phases were analyzed on a dedicated work station using MPR, MIP and VRT modes. The patient received 80 cc of contrast.  FINDINGS: Aortic Valve: Tri-leaflet Aortic valve. All 3 leaflets heavily calcified. Moderate calcification of the annulus extending into the intervalvular fibrosa and base of anterior mitral leaflet  Aorta: Mild calcification of ascending root and arch. Moderate mural debris/plaque in arch and descending thoracic aorta. No dissection, aneurysm, penetrating ulcer or protruding plaque  Normal origin of the great arteries  Aorta:  3.4 cm  Sinotubular Junction:  3.2 cm  Aortic Arch:  2.9 cm  Descending Thoracic Aorta:  2.6 cm  Sinus of Valsalva Measurements:  Non-coronary:  35.6 mm  Right -coronary:  37.8 mm  Left -coronary:  36.7 mm  Coronary Artery Height above Annulus:  Left Main:  16 mm  Right Coronary:  17 mm  Virtual Basal Annulus Measurements:  Maximum/Minimum Diameter:  32 mm x 24 mm  Perimeter:  87.9 mm  Area:  563 cm2  Coronary Arteries: Right dominant. Heavily calcified RCA/LAD Height of ostia sufficient for TAVR See cath report  Optimum Fluoroscopic Angle for Delivery: LAO 9 degrees Cranial 0 degrees  IMPRESSION: 1) Heavily calcified  tri-leaflet Aortic Valve. Calcification extending into annulus, intervalvular fibrosa and base of anterior mitral leaflet  2) Suitable for 29 mm Sapien XT valve with area of 563 cm2  3) Aortic Arch and descending thoracic aorta with moderate mural plaque and mild calcification. Normal origin of great arteries  4) Coronary artery ostium height suitable for TAVR  5) Optimum angiographic angle for delivery LAO 9 degrees Cranial 0 degrees  Charlton Haws  Electronically Signed: By: Charlton Haws M.D. On: 11/02/2013 11:23   Dg Chest Port 1 View  11/16/2013   CLINICAL DATA:  Respiratory failure.  EXAM: PORTABLE CHEST - 1 VIEW  COMPARISON:  11/15/2013 and 11/14/2013  FINDINGS: Endotracheal tube, central venous catheter, and NG tube appear unchanged and in good position.  There is improved aeration at both lung bases with decrease in the bilateral small pleural effusions.  IMPRESSION: Slight decrease in the effusions. Improved aeration at the lung bases.   Electronically Signed   By: Geanie Cooley M.D.   On: 11/16/2013 08:13   Dg Chest Port 1 View  11/15/2013  CLINICAL DATA:  Respiratory failure  EXAM: PORTABLE CHEST - 1 VIEW  COMPARISON:  11/14/2013  FINDINGS: Endotracheal tube with the tip 5.6 cm above the carina. Nasogastric tube coursing below the diaphragm. Left jugular central venous catheter with the tip projecting over the SVC.  Bilateral small pleural effusions. Bibasilar atelectasis. Mild bilateral interstitial thickening. No pneumothorax. Stable cardiomediastinal silhouette. Unremarkable osseous structures.  IMPRESSION: Bilateral small pleural effusions. Support lines and tubing in satisfactory position.   Electronically Signed   By: Elige Ko   On: 11/15/2013 08:09   Dg Chest Port 1 View  11/14/2013   CLINICAL DATA:  Central catheter placement  EXAM: PORTABLE CHEST - 1 VIEW  COMPARISON:  November 13, 2013  FINDINGS: Left jugular catheter tip is in the superior vena cava. Right central catheter tip is  also in the superior vena cava. Endotracheal tube tip is 4.5 cm above the carina. Nasogastric tube tip and side port are below the diaphragm. There is no demonstrable pneumothorax.  There are bilateral effusions with mild bibasilar edema. Heart is enlarged. The pulmonary vascularity is normal. No adenopathy.  IMPRESSION: Tube and catheter positions as described without pneumothorax. Evidence of congestive heart failure.   Electronically Signed   By: Bretta Bang M.D.   On: 11/14/2013 15:40   Dg Chest Port 1 View  11/13/2013   CLINICAL DATA:  Extubation. Followup basilar atelectasis versus pneumonia.  EXAM: PORTABLE CHEST - 1 VIEW  COMPARISON:  Portable chest x-rays 11/11/2013 dating back to 11/16/2013.  FINDINGS: Right jugular central venous catheter tip projects over the lower SVC. Right jugular introducer sheath tip projects over the upper SVC. Nasogastric to courses below the diaphragm with its tip in the fundus of the stomach. Worsened aeration in the right lung base. Stable dense consolidation in the left lower lobe. Mild pulmonary venous hypertension without overt edema.  IMPRESSION: Support apparatus satisfactory. Worsening atelectasis at the right lung base. Stable dense left lower lobe atelectasis and/or pneumonia.   Electronically Signed   By: Hulan Saas M.D.   On: 11/13/2013 08:10   Dg Chest Portable 1 View  11/11/2013   CLINICAL DATA:  Respiratory failure.  EXAM: PORTABLE CHEST - 1 VIEW  COMPARISON:  Chest x-ray 11/10/2013.  FINDINGS: An endotracheal tube is in place with tip 3.4 cm above the carina. There is a right-sided internal jugular central venous catheter with tip terminating in the distal superior vena cava. A nasogastric tube is seen extending into the stomach, however, the tip of the nasogastric tube extends below the lower margin of the image. Lung volumes are low. Bibasilar opacities may reflect areas of atelectasis and/or consolidation (left greater than right). Small left  pleural effusion. Crowding of the pulmonary vasculature, accentuated by low lung volumes, without frank pulmonary edema. Heart size is normal. Upper mediastinal contours are distorted by patient positioning. Atherosclerosis in the thoracic aorta.  IMPRESSION: 1. Support apparatus, as above. 2. Low lung volumes with bibasilar (left greater than right) atelectasis and/or consolidation, and small left pleural effusion.   Electronically Signed   By: Trudie Reed M.D.   On: 11/11/2013 10:11   Dg Chest Port 1 View  11/10/2013   CLINICAL DATA:  Pneumonitis  EXAM: PORTABLE CHEST - 1 VIEW  COMPARISON:  11/09/2013  FINDINGS: Right internal jugular vascular sheath status post removal of Swan-Ganz catheter. Adjacent central line same location stable. No change in position of endotracheal tube above the carina. NG tube again identified projecting over the stomach.  Mild  cardiac enlargement. Limited inspiratory effect. Vascular pattern within normal limits with no evidence of pulmonary edema. Retrocardiac opacity stable. Mild atelectatic change right lung base stable.  IMPRESSION: Stable bilateral lower lobe opacities.   Electronically Signed   By: Esperanza Heir M.D.   On: 11/10/2013 08:00   Dg Chest Port 1 View  11/09/2013   CLINICAL DATA:  Post abdominal aortic stent graft.  EXAM: PORTABLE CHEST - 1 VIEW  COMPARISON:  11/08/2013; 11/07/2013  FINDINGS: Grossly unchanged enlarged cardiac silhouette and mediastinal contours given reduced lung volumes and patient rotation. Stable positioning of support apparatus including right jugular approach PA catheter coiled overlying the expected location of the main pulmonary outflow tract. No pneumothorax.  Lung volumes remain reduced with unchanged trace effusions and associated bibasilar opacities, left greater than right. No new focal airspace opacities. No evidence of edema. Unchanged bones.  IMPRESSION: 1. Stable positioning of support apparatus including tip of right  jugular approach PA catheter coiled over the main pulmonary artery outflow tract. No pneumothorax. 2. Unchanged trace bilateral effusions and associated bibasilar opacities, left greater than right, atelectasis versus infiltrate. 3. No evidence of edema.   Electronically Signed   By: Simonne Come M.D.   On: 11/09/2013 07:44   Dg Chest Port 1 View  11/08/2013   CLINICAL DATA:  Ventilator  EXAM: PORTABLE CHEST - 1 VIEW  COMPARISON:  11/07/2013  FINDINGS: Endotracheal tube in good position. Swan-Ganz catheter tip in the main pulmonary artery. The catheter tip is coiled in the main pulmonary artery unchanged. Right jugular catheter tip in the SVC. NG tube in the stomach.  Mild bibasilar atelectasis unchanged. Negative for edema. Negative for pneumothorax.  IMPRESSION: Support lines unchanged in position. Swan-Ganz catheter tip is coiled in the main pulmonary artery  Bibasilar atelectasis unchanged.   Electronically Signed   By: Marlan Palau M.D.   On: 11/08/2013 08:06   Dg Chest Port 1 View  11/07/2013   CLINICAL DATA:  Status post EVAR and aortic valvuloplasty.  EXAM: PORTABLE CHEST - 1 VIEW  COMPARISON:  11/05/2013  FINDINGS: Endotracheal tube remains with the tip approximately 3.7 cm above the carina. Swan-Ganz catheter continues to show coiled configuration in the main pulmonary artery. Additional jugular central line tip is in the SVC. Relatively stable bilateral lower lobe atelectasis present, left greater than right. Probable small left pleural effusion remains. No pulmonary edema, pneumothorax or pneumomediastinum. Cardiac and mediastinal contours are stable.  IMPRESSION: No pneumothorax. Stable bilateral lower lobe atelectasis. Stable probable small left pleural effusion.   Electronically Signed   By: Irish Lack M.D.   On: 11/07/2013 08:21   Dg Chest Portable 1 View  11/17/2013   CLINICAL DATA:  Postop for stent graft placement.  EXAM: PORTABLE CHEST - 1 VIEW  COMPARISON:  10/18/2013  FINDINGS:  Endotracheal tube is 3.5 cm above the carina. There are two right jugular central lines. One central line is in the SVC region. The other is a Swan-Ganz catheter which appears to be coiled in the main pulmonary outflow tract region. Few patchy densities at the left lung base are most compatible with atelectasis. No evidence for a pneumothorax.  IMPRESSION: Left basilar atelectasis.  Negative for a pneumothorax.  Swan-Ganz catheter appears to be coiled in the main pulmonary artery region.   Electronically Signed   By: Richarda Overlie M.D.   On: 11/07/2013 15:58   Dg Abd Portable 1v  11/12/2013   CLINICAL DATA:  Abdominal aortic aneurysm endovascular stent  graft repair.  EXAM: PORTABLE ABDOMEN - 1 VIEW  COMPARISON:  11/09/2013  FINDINGS: Calcification along the large native aneurysm surrounds the aortic stent graft, unchanged from the prior study.  Normal bowel gas pattern. No evidence of obstruction or generalized adynamic ileus.  Soft tissues are unremarkable.  IMPRESSION: 1. No acute findings.  No change the prior study.   Electronically Signed   By: Amie Portland M.D.   On: 11/12/2013 08:16   Dg Abd Portable 1v  11/09/2013   CLINICAL DATA:  Abdomen distention.  EXAM: PORTABLE ABDOMEN - 1 VIEW  COMPARISON:  CT 11/07/2013.  FINDINGS: NG tube noted in stomach. Soft tissue structures are stable. Abdominal aortic aneurysm with stent graft again noted. No bowel distention. Moderate amount of stool in colon. No free air. Bilateral hip replacements.  IMPRESSION: 1. NG tube noted in stomach.  No bowel distention.  No free air. 2. Under amount of stool in colon. 3. Abdominal aortic aneurysm with stent graft again noted. 4. Bilateral hip replacements.   Electronically Signed   By: Maisie Fus  Register   On: 11/09/2013 09:43   Dg Abd Portable 1v  11/07/2013   CLINICAL DATA:  80 year old male with abdominal pain greater on the right side. Initial encounter.  EXAM: PORTABLE ABDOMEN - 1 VIEW  COMPARISON:  CTA abdomen and pelvis  10/17/2013. Portable chest radiograph from 0641 hr today.  FINDINGS: Portable AP supine view at 0936 hr.  New bifurcated abdominal aortic endograft. Large rim calcified native abdominal aortic aneurysm sac.  Non obstructed bowel gas pattern. Enteric tube looped in the left upper quadrant at the level of the stomach. Partially visible Swan-Ganz catheter. Bilateral hip arthroplasty hardware partially visible. Increased left lung base opacity, as seen earlier today.  IMPRESSION: 1. Non obstructed bowel gas pattern. Enteric tube looped in the region of the stomach. 2. Left lung base consolidation or collapse. 3. New abdominal aortic endograft since 10/28/2013, large rim calcified native aneurysm sac.   Electronically Signed   By: Augusto Gamble M.D.   On: 11/07/2013 09:55   Ct Cta Abd/pel W/cm &/or W/o Cm  09/29/2013   CLINICAL DATA:  ABDOMINAL PAIN, AAA  EXAM: CT ANGIOGRAPHY ABDOMEN AND PELVIS  TECHNIQUE: Multidetector CT imaging of the abdomen and pelvis was performed using the standard protocol during bolus administration of intravenous contrast. Multiplanar reconstructed images including MIPs were obtained and reviewed to evaluate the vascular anatomy.  CONTRAST:  OMNIPAQUE IOHEXOL 350 MG/ML SOLN  COMPARISON:  Ultrasound from earlier the same day  FINDINGS: ARTERIAL FINDINGS:  Coronary calcifications.  Aorta: Mild atheromatous plaque in the ectatic and torturous mildly tortuous visualized distal descending thoracic aorta. There is more extensive irregular partially calcified plaque in the juxtarenal aorta. There is a fusiform infrarenal aneurysm measuring 8.4 x 8.5 cm maximum transverse diameter, tapering to a diameter of 4.7 cm at the bifurcation. There is a large amount of nonocclusive mural thrombus. No adjacent inflammatory/edematous changes. No retroperitoneal hematoma.  Celiac axis: There is an eccentric origin diverticulum which measures approximately 10 mm diameter. There is mild short-segment narrowing  over approximately 1 cm at the level of the median arcuate ligament of the diaphragm, patent distally.  Superior mesenteric: Calcified ostial plaque resulting in short segment mild stenosis of doubtful hemodynamic significance. Patent distally with classic branch anatomy.  Left renal:           Single, patent  Right renal: Single. There is eccentric partially calcified plaque extending from the origin over a  length of approximately 2 cm resulting in mild stenosis. Patent distally.  Inferior mesenteric: Short-segment origin occlusion, reconstituted distally by visceral collaterals.  Left iliac: The origin of the common iliac is involved by the aneurysm, measuring 2 cm diameter, tapering to a diameter of 12 mm at the common iliac bifurcation. Internal iliac is tortuous and atheromatous. There is mild tortuosity of the external iliac artery with some scattered plaque, no stenosis or aneurysm. There is origin occlusion of the SFA, distal extent not included on the study.  Right iliac: Ectatic common iliac artery, 14 mm diameter at its origin, tapering to 13 mm. There is scattered plaque in the internal and external iliac arteries without high-grade stenosis or aneurysm. Marked tortuosity of the external iliac artery may be problematic for percutaneous access.  Venous findings:      Dedicated venous phase imaging not obtained.  Review of the MIP images confirms the above findings.  Nonvascular findings: Moderately advanced emphysematous changes in the visualized lung bases with some dependent atelectasis posteriorly. Unremarkable arterial phase evaluation of liver, spleen, adrenal glands, pancreas, right kidney. There is a 17 mm low-attenuation lesion exophytic from the mid portion of the left kidney medial to the collecting system, measuring above simple fluid attenuation. The stomach, small bowel, and colon are nondilated. Normal appendix. Scattered colonic diverticula most numerous in the sigmoid segment, without  adjacent inflammatory/ edematous change. Bilateral hip arthroplasty hardware results in streak artifact degrading portions of the scan. Urinary bladder is physiologically distended. No ascites. No free air. No adenopathy. Degenerative disc disease in the lumbar spine most marked L4-5 and L5-S1.  IMPRESSION: 1. 8.5 cm infrarenal abdominal aortic aneurysm extending across the bifurcation to involve the proximal left common iliac artery. No evidence of rupture or impending rupture. However, the size presents will a significant risk of rupture, and vascular surgical consultation is recommended. 2. Tortuous right iliac arterial system without aneurysm or stenosis. 3. Origin occlusion of the left SFA. 4. 17 mm exophytic left renal mass, possibly hyperdense cyst but incompletely characterized. 5. Colonic diverticulosis.   Electronically Signed   By: Oley Balm M.D.   On: 10/19/2013 19:07   Anti-infectives: Anti-infectives   Start     Dose/Rate Route Frequency Ordered Stop   11/09/13 1600  vancomycin (VANCOCIN) IVPB 750 mg/150 ml premix  Status:  Discontinued     750 mg 150 mL/hr over 60 Minutes Intravenous Every 24 hours 11/09/13 1539 11/10/13 1201   11/08/13 2000  piperacillin-tazobactam (ZOSYN) IVPB 2.25 g     2.25 g 100 mL/hr over 30 Minutes Intravenous 3 times per day 11/08/13 1204     11/08/13 1500  vancomycin (VANCOCIN) IVPB 750 mg/150 ml premix  Status:  Discontinued     750 mg 150 mL/hr over 60 Minutes Intravenous Every 24 hours 11/08/13 1449 11/09/13 1404   11/07/13 1215  vancomycin (VANCOCIN) 250 mg in sodium chloride 0.9 % 100 mL IVPB     250 mg 100 mL/hr over 60 Minutes Intravenous  Once 11/07/13 1209 11/07/13 1559   11/07/13 1200  piperacillin-tazobactam (ZOSYN) IVPB 3.375 g  Status:  Discontinued     3.375 g 12.5 mL/hr over 240 Minutes Intravenous 3 times per day 11/07/13 1154 11/08/13 1204   11/07/13 1000  vancomycin (VANCOCIN) 500 mg in sodium chloride 0.9 % 100 mL IVPB  Status:   Discontinued     500 mg 100 mL/hr over 60 Minutes Intravenous Every 24 hours 11/07/13 0905 11/07/13 1208   11/13/2013 0615  levofloxacin (LEVAQUIN) IVPB 500 mg     500 mg 100 mL/hr over 60 Minutes Intravenous To Surgery 11/19/2013 0605 11/27/2013 0820   11/07/2013 0600  vancomycin (VANCOCIN) IVPB 1000 mg/200 mL premix  Status:  Discontinued     1,000 mg 200 mL/hr over 60 Minutes Intravenous On call to O.R. 11/05/13 0745 11/16/2013 1450   November 10, 2013 0600  vancomycin (VANCOCIN) IVPB 1000 mg/200 mL premix  Status:  Discontinued     1,000 mg 200 mL/hr over 60 Minutes Intravenous To Surgery 10/30/13 0730 11/01/13 1740      Assessment/Plan: s/p Procedure(s) with comments: ABDOMINAL AORTIC ENDOVASCULAR STENT GRAFT REPAIR  (Bilateral) BALLOON AORTIC VALVE VALVULOPLASTY (N/A) - Cooper will start first ENDARTERECTOMY FEMORAL WITH PATCH ANGIOPLASTY (Right) Continue current support. No new acute events   LOS: 19 days   Lorriane Dehart 11/17/2013, 8:39 AM

## 2013-11-17 NOTE — Progress Notes (Signed)
PARENTERAL NUTRITION CONSULT NOTE   Pharmacy Consult for TNA  Indication: Possible bowel ischemia   Allergies  Allergen Reactions  . Penicillins     unknown    Patient Measurements: Height:  (177.8 cm) Weight: 208 lb 5.4 oz (94.5 kg) IBW/kg (Calculated) : 73  Vital Signs: Temp: 97.5 F (36.4 C) (09/19 0400) Temp src: Oral (09/19 0400) BP: 103/58 mmHg (09/19 0600) Pulse Rate: 83 (09/19 0600) Intake/Output from previous day: 09/18 0701 - 09/19 0700 In: 2920 [I.V.:1040; NG/GT:120; IV Piggyback:150; TPN:1610] Out: 2550 [Urine:2550] Intake/Output from this shift: Total I/O In: 1870 [I.V.:660; NG/GT:60; IV Piggyback:100; TPN:1050] Out: 1200 [Urine:1200]  Labs:  Recent Labs  11/16/13 0430 11/17/13 0500  WBC 23.5* 21.3*  HGB 8.8* 8.9*  HCT 25.8* 26.1*  PLT 134* 162     Recent Labs  11/15/13 0400 11/16/13 0430  NA 142 143  K 3.8 3.3*  CL 102 101  CO2 24 25  GLUCOSE 138* 138*  BUN 79* 88*  CREATININE 3.49* 3.77*  CALCIUM 7.9* 7.8*  MG 2.0  --   PHOS 5.5*  --   PROT 4.6*  --   ALBUMIN 1.3*  --   AST 48*  --   ALT 64*  --   ALKPHOS 114  --   BILITOT 3.1*  --    Estimated Creatinine Clearance: 16.8 ml/min (by C-G formula based on Cr of 3.77).    Recent Labs  11/16/13 1644 11/17/13 0100 11/17/13 0414  GLUCAP 124* 116* 124*    Insulin Requirements in the past 24 hours:  4 unit, On SSI   Assessment: 27 YOM admitted with abdominal pain due to suspected bowel ischemia. Pt is malnourished due to inadequate oral intake prior to admission. Marland Kitchen POD # 11 s/p AAA repair with ischemic bowel.   GI: Watershed bowel ischemia s/p AAA repair. Not sure if pt would survive colon resection Endo: No hx of DM, CBGs controlled  Lytes: Na 143, K 3.3,  Phos 6.0 Mag 1.9, CoCa 9.96  CoCa x phos =59.76 Renal: SCr 3.77, UOP 2 mL/kg/hr, lasix 80 IV X 2 given   Weight up significantly since admission. No further diuresis per CCM. Severe hypervolemia - improving some with  diuresis. Not a candidate for HD.   Hepatobil: transaminases elevated, t bili 3.1  TG 122 TPN Access: CVC Rt internal Jugular  TPN day#: 7 Current Nutrition:  Clinimix  5/15 at 60 ml/hr, lipids @ 10 ml/hr provides 72 gm protein and 1502 kcals Goal rate 90 ml/hr  Nutritional Goals Per RD:  Kcal: 2000-2150 Protein: 100-110 gm  Plan:  -Change Clinimix E 5/15 to No electrolytes (due to elevated phos X CoCa). Cont at 60 ml/hr with lipids at 10 ml/hr -KCl 10 mEq IV X 3 -Cont MVI daily and cont TE  MWF -advance TPN when fluid status improves -f/u am bmet  Shakerria Parran, Pharm.D. [phone] 11/17/2013 6:34 AM

## 2013-11-18 ENCOUNTER — Inpatient Hospital Stay (HOSPITAL_COMMUNITY): Payer: Medicare Other

## 2013-11-18 DIAGNOSIS — J9600 Acute respiratory failure, unspecified whether with hypoxia or hypercapnia: Secondary | ICD-10-CM

## 2013-11-18 LAB — CBC
HCT: 24.6 % — ABNORMAL LOW (ref 39.0–52.0)
HEMOGLOBIN: 8.3 g/dL — AB (ref 13.0–17.0)
MCH: 30.6 pg (ref 26.0–34.0)
MCHC: 33.7 g/dL (ref 30.0–36.0)
MCV: 90.8 fL (ref 78.0–100.0)
Platelets: 195 10*3/uL (ref 150–400)
RBC: 2.71 MIL/uL — ABNORMAL LOW (ref 4.22–5.81)
RDW: 16.1 % — ABNORMAL HIGH (ref 11.5–15.5)
WBC: 17.8 10*3/uL — ABNORMAL HIGH (ref 4.0–10.5)

## 2013-11-18 LAB — GLUCOSE, CAPILLARY
GLUCOSE-CAPILLARY: 112 mg/dL — AB (ref 70–99)
GLUCOSE-CAPILLARY: 106 mg/dL — AB (ref 70–99)
GLUCOSE-CAPILLARY: 117 mg/dL — AB (ref 70–99)
Glucose-Capillary: 105 mg/dL — ABNORMAL HIGH (ref 70–99)
Glucose-Capillary: 105 mg/dL — ABNORMAL HIGH (ref 70–99)
Glucose-Capillary: 115 mg/dL — ABNORMAL HIGH (ref 70–99)
Glucose-Capillary: 120 mg/dL — ABNORMAL HIGH (ref 70–99)
Glucose-Capillary: 126 mg/dL — ABNORMAL HIGH (ref 70–99)
Glucose-Capillary: 127 mg/dL — ABNORMAL HIGH (ref 70–99)

## 2013-11-18 LAB — COMPREHENSIVE METABOLIC PANEL
ALT: 33 U/L (ref 0–53)
ANION GAP: 17 — AB (ref 5–15)
AST: 43 U/L — ABNORMAL HIGH (ref 0–37)
Albumin: 1.4 g/dL — ABNORMAL LOW (ref 3.5–5.2)
Alkaline Phosphatase: 104 U/L (ref 39–117)
BUN: 95 mg/dL — ABNORMAL HIGH (ref 6–23)
CALCIUM: 7.7 mg/dL — AB (ref 8.4–10.5)
CO2: 24 meq/L (ref 19–32)
Chloride: 104 mEq/L (ref 96–112)
Creatinine, Ser: 3.74 mg/dL — ABNORMAL HIGH (ref 0.50–1.35)
GFR, EST AFRICAN AMERICAN: 16 mL/min — AB (ref >90)
GFR, EST NON AFRICAN AMERICAN: 14 mL/min — AB (ref >90)
GLUCOSE: 126 mg/dL — AB (ref 70–99)
Potassium: 3.4 mEq/L — ABNORMAL LOW (ref 3.7–5.3)
Sodium: 145 mEq/L (ref 137–147)
TOTAL PROTEIN: 5.1 g/dL — AB (ref 6.0–8.3)
Total Bilirubin: 3.4 mg/dL — ABNORMAL HIGH (ref 0.3–1.2)

## 2013-11-18 MED ORDER — FAT EMULSION 20 % IV EMUL
250.0000 mL | INTRAVENOUS | Status: DC
  Administered 2013-11-18: 250 mL via INTRAVENOUS
  Filled 2013-11-18: qty 250

## 2013-11-18 MED ORDER — POTASSIUM CHLORIDE 10 MEQ/50ML IV SOLN
10.0000 meq | INTRAVENOUS | Status: AC
  Administered 2013-11-18 (×2): 10 meq via INTRAVENOUS
  Filled 2013-11-18: qty 50

## 2013-11-18 MED ORDER — M.V.I. ADULT IV INJ
INJECTION | INTRAVENOUS | Status: DC
  Administered 2013-11-18: 18:00:00 via INTRAVENOUS
  Filled 2013-11-18: qty 2000

## 2013-11-18 NOTE — Progress Notes (Signed)
PULMONARY / CRITICAL CARE MEDICINE   Name: Shawn Mathews MRN: [id number] DOB: 1927-07-03    ADMISSION DATE:  10/09/2013 CONSULTATION DATE:  9/8  REFERRING MD :  Imogene Burn  CHIEF COMPLAINT:  Post op vent management  INITIAL PRESENTATION: 80 y/o male admitted on 9/8 with abdominal pain felt to be due to AAA, found to have critical symptomatic aortic stenosis. Underwent endovascular AAA repair on 9/8 with valvuloplasty so PCCM consulted for post op vent management.  STUDIES:  8/31 CT angio AB > AAA 8.5 cm, tortuous R iliac arterial system, occluded SFA, 17 mm renal mass left 9/04 CT angio heart/chest> emphysema, calcified mitral and aortic valves, calcified trileaflet aortic valve  SIGNIFICANT EVENTS: 9/08 Aortic valvuloplasty, bilat common fem artery cannulation, R iliofemoral endarterectomy, repair left common femoral artery, aortogram, repair of aorta with bifurcated prosthesis, aortic valvuloplasty 9/09 worsening shock, tachy  9/14 Failed SBT. Wheezing. Edema pattern on CXR. Lasix X 1 ordered. BDs changed to scheduled 9/15. Failed SBT. Wheezing improved. Further diuresis  9/17 Tolerated PS 10 cm H2O. Advanced directives discussed  with daughter and son in law. Agreed to continued maximum support for a few more days at most (into early next week). If not substantially improving by then, move to comfort care and vent withdrawal. No CPR/ACLS, no HD, no trach tube. Further diuresis administered 9/18 Tolerates PS 5 cm H2O for brief stints. Cr rising. No further diuresis 9/20 Increased peak pressures >> ETT changed and improved  SUBJECTIVE:  Needed to have ETT changed last night.  Not tolerating pressure support.  VITAL SIGNS: Temp:  [97.1 F (36.2 C)-98.4 F (36.9 C)] 98.4 F (36.9 C) (09/20 0000) Pulse Rate:  [80-103] 91 (09/20 0700) Resp:  [20-29] 25 (09/20 0700) BP: (79-156)/(44-100) 83/61 mmHg (09/20 0700) SpO2:  [94 %-100 %] 98 % (09/20 0700) FiO2 (%):  [40 %] 40 % (09/20  0402) Weight:  [201 lb 1 oz (91.2 kg)] 201 lb 1 oz (91.2 kg) (09/20 0500) VENTILATOR SETTINGS: Vent Mode:  [-] PRVC FiO2 (%):  [40 %] 40 % Set Rate:  [20 bmp] 20 bmp Vt Set:  [580 mL] 580 mL PEEP:  [5 cmH20] 5 cmH20 Plateau Pressure:  [14 cmH20-21 cmH20] 14 cmH20 INTAKE / OUTPUT:  Intake/Output Summary (Last 24 hours) at 11/18/13 0737 Last data filed at 11/18/13 0700  Gross per 24 hour  Intake   2550 ml  Output   2080 ml  Net    470 ml    PHYSICAL EXAMINATION: General: ill appearing HEENT: ETT in place PULM:  Faint b/l wheeze CV: regular, 3/6 SM AB: diffusely tender, +bowel sounds Ext: anasarca   LABS: CBC Recent Labs     11/16/13  0430  11/17/13  0500  11/18/13  0430  WBC  23.5*  21.3*  17.8*  HGB  8.8*  8.9*  8.3*  HCT  25.8*  26.1*  24.6*  PLT  134*  162  195    BMET Recent Labs     11/16/13  0430  11/17/13  0500  11/18/13  0430  NA  143  143  145  K  3.3*  3.3*  3.4*  CL  101  101  104  CO2  BUN  88*  93*  95*  CREATININE  3.77*  3.71*  3.74*  GLUCOSE  138*  110*  126*    Electrolytes Recent Labs     11/16/13  0430  11/17/13  0500  11/18/13  0430  CALCIUM  7.8*  7.8*  7.7*  MG   --   1.9   --   PHOS   --   6.0*   --     Sepsis Markers Recent Labs     11/17/13  0500  PROCALCITON  17.89    Liver Enzymes Recent Labs     11/18/13  0430  AST  43*  ALT  33  ALKPHOS  104  BILITOT  3.4*  ALBUMIN  1.4*    Glucose Recent Labs     11/17/13  0733  11/17/13  1144  11/17/13  1522  11/17/13  2007  11/18/13  0028  11/18/13  0405  GLUCAP  106*  112*  120*  101*  105*  126*    Imaging Dg Neck Soft Tissue  11/18/2013   CLINICAL DATA:  Assess endotracheal tube.  EXAM: NECK SOFT TISSUES - 1+ VIEW  COMPARISON:  None.  FINDINGS: Single frontal radiograph of the neck. Endotracheal tube tip projects immediately below the clavicles. The visualized to appears straight in course. LEFT internal jugular central venous catheter,  distal tip crossing midline, not imaged. Coarse calcification in the RIGHT neck are likely vascular.  IMPRESSION: Endotracheal tube tip projects below the clavicle head.   Electronically Signed   By: Awilda Metro   On: 11/18/2013 02:02   Dg Chest Port 1 View  11/18/2013   CLINICAL DATA:  Endotracheal tube repositioning. Respiratory failure.  EXAM: PORTABLE CHEST - 1 VIEW  COMPARISON:  Chest radiograph performed earlier today at 1:38 a.m.  FINDINGS: The patient's endotracheal tube is seen ending 2-3 cm above the carina. A left IJ line is noted ending about the mid SVC. An enteric tube is noted extending below the diaphragm, with the side port noted at the fundus of the stomach.  There is elevation of the right hemidiaphragm. Left basilar airspace opacification may reflect atelectasis or pneumonia. Small bilateral pleural effusions are suspected. No pneumothorax is seen.  The cardiomediastinal silhouette is normal in size. No acute osseous abnormalities are identified. There is chronic narrowing of the right glenohumeral joint.  IMPRESSION: 1. Endotracheal tube seen ending 2-3 cm above the carina. 2. Elevation of the right hemidiaphragm. Persistent left basilar airspace opacification may reflect atelectasis or pneumonia. Suspect small bilateral pleural effusions.   Electronically Signed   By: Roanna Raider M.D.   On: 11/18/2013 02:32   Dg Chest Port 1 View  11/18/2013   CLINICAL DATA:  Respiratory distress. Assess endotracheal tube position.  EXAM: PORTABLE CHEST - 1 VIEW  COMPARISON:  Chest radiograph performed 11/17/2013  FINDINGS: The patient's endotracheal tube is seen ending 5 cm above the carina. The enteric tube is noted extending below the diaphragm, with the side port seen at the distal esophagus. A left IJ line is noted ending about the mid SVC.  Mild left basilar airspace opacity may reflect atelectasis or possibly pneumonia. Small bilateral pleural effusions are suspected. No pneumothorax is  seen.  The cardiomediastinal silhouette is normal in size. No acute osseous abnormalities are identified.  IMPRESSION: 1. Endotracheal tube seen ending 5 cm above the carina. 2. Mild left basilar airspace opacity may reflect atelectasis or possibly mild pneumonia, relatively stable given differences in lung expansion. Small bilateral pleural effusions suspected.   Electronically Signed   By: Roanna Raider M.D.   On: 11/18/2013 02:01   Dg Chest Port 1 View  11/17/2013   CLINICAL DATA:  Follow-up respiratory failure.  EXAM: PORTABLE CHEST - 1 VIEW  COMPARISON:  1915  FINDINGS: Endotracheal tube remains in place with tip just below the level of the clavicular heads and tip well above the carina. Left jugular central venous catheter remains, projecting over the SVC. Enteric tube projects over the stomach. Cardiomediastinal silhouette is unchanged, cardiac silhouette upper limits of normal in size. Lung volumes remain low with mild parenchymal opacities remaining in the left greater than right lung bases and likely small bilateral pleural effusions, overall similar to the prior study.  IMPRESSION: No significant interval change of small bilateral pleural effusions and left greater than right lung opacities, likely atelectasis.   Electronically Signed   By: Sebastian Ache   On: 11/17/2013 08:39    ASSESSMENT / PLAN:  PULMONARY ETT 9/8 >>   A:  Prolonged VDRF.  AECOPD - resolved. Pulmonary edema. P:   Wean on pressure support as tolerated F/u CXR Try to optimize for extubation >> once extubated, then would not re-intubate; family does not want trach Continue scheduled BD's  CARDIOVASCULAR Lt IJ CVL 9/16 >>  A:  Hemorrhagic/cardiogenic shock >> resolved. P:  Monitor hemodynamics DNR if arrests  RENAL A:  AKI, nonoliguric 2nd to hypotension and diuresis. CKD. Severe hypervolemia - Not a candidate for HD. Hypokalemia. P:   Monitor renal fx, urine outpt F/u and replace electrolytes as  needed  GASTROINTESTINAL A:  Bowel ischemia s/p AAA repair. Protein calorie malnutrition. P:   Protonix for SUP Continue TPN  HEMATOLOGIC A:   Anemia of critical illness and chronic disease. P:  F/u CBC SCD's for DVT prevention >> defer to VVS whether pt can be started on lovenox  INFECTIOUS A:   Sepsis with elevated procalcitonin (43.5 on 9/16) >> source unclear, but likely abdominal. P:   Day 12 zosyn, started 9/09 Trend procalcitonin  ENDOCRINE A:   Hyperglycemia without prior hx of DM, controlled  P:   SSI  NEUROLOGIC A:   ICU associated discomfort/agitation P:   RASS goal: -1 Continue Fentanyl gtt, dexmedetomidine Daily WUA  Goals of Care >> DNR  TODAY'S SUMMARY:  Not making progress with vent weaning.  Will need to d/w family further this coming week about plans for trach/long term vent versus one way extubation and if fails (which is likely), then transition to comfort care.  No family at bedside.  CC time 35 minutes.  Coralyn Helling, MD [facility] Pulmonary/Critical Care 11/18/2013, 7:37 AM Pager:  [phone] After 3pm call: [phone]

## 2013-11-18 NOTE — Progress Notes (Signed)
ANTIBIOTIC CONSULT NOTE - FOLLOW UP  Pharmacy Consult for Zosyn Indication: intra-abdominal infection  Allergies  Allergen Reactions  . Penicillins     unknown    Patient Measurements: Height:  (177.8 cm) Weight: 201 lb 1 oz (91.2 kg) IBW/kg (Calculated) : 73  Vital Signs: Temp: 99.2 F (37.3 C) (09/20 1210) Temp src: Axillary (09/20 1210) BP: 133/115 mmHg (09/20 1400) Pulse Rate: 94 (09/20 1300) Intake/Output from previous day: 09/19 0701 - 09/20 0700 In: 2550 [I.V.:681; NG/GT:180; IV Piggyback:200; TPN:1489] Out: 2080 [Urine:1880; Emesis/NG output:200] Intake/Output from this shift: Total I/O In: 872 [I.V.:182; NG/GT:60; IV Piggyback:150; TPN:480] Out: 475 [Urine:475]  Labs:  Recent Labs  11/16/13 0430 11/17/13 0500 11/18/13 0430  WBC 23.5* 21.3* 17.8*  HGB 8.8* 8.9* 8.3*  PLT 134* 162 195  CREATININE 3.77* 3.71* 3.74*   Estimated Creatinine Clearance: 16.7 ml/min (by C-G formula based on Cr of 3.74). No results found for this basename: VANCOTROUGH, Leodis Binet, VANCORANDOM, GENTTROUGH, GENTPEAK, GENTRANDOM, TOBRATROUGH, TOBRAPEAK, TOBRARND, AMIKACINPEAK, AMIKACINTROU, AMIKACIN,  in the last 72 hours   Microbiology: Recent Results (from the past 720 hour(s))  SURGICAL PCR SCREEN     Status: None   Collection Time    10/30/13  7:52 AM      Result Value Ref Range Status   MRSA, PCR NEGATIVE  NEGATIVE Final   Staphylococcus aureus NEGATIVE  NEGATIVE Final   Comment:            The Xpert SA Assay (FDA     approved for NASAL specimens     in patients over 53 years of age),     is one component of     a comprehensive surveillance     program.  Test performance has     been validated by [employer] for patients greater     than or equal to 55 year old.     It is not intended     to diagnose infection nor to     guide or monitor treatment.  SURGICAL PCR SCREEN     Status: None   Collection Time    11/05/13 10:44 AM      Result Value Ref Range  Status   MRSA, PCR NEGATIVE  NEGATIVE Final   Staphylococcus aureus NEGATIVE  NEGATIVE Final   Comment:            The Xpert SA Assay (FDA     approved for NASAL specimens     in patients over 85 years of age),     is one component of     a comprehensive surveillance     program.  Test performance has     been validated by [employer] for patients greater     than or equal to 66 year old.     It is not intended     to diagnose infection nor to     guide or monitor treatment.  CULTURE, RESPIRATORY (NON-EXPECTORATED)     Status: None   Collection Time    11/14/13  9:45 AM      Result Value Ref Range Status   Specimen Description TRACHEAL ASPIRATE   Final   Special Requests Normal   Final   Gram Stain     Final   Value: FEW WBC PRESENT,BOTH PMN AND MONONUCLEAR     FEW SQUAMOUS EPITHELIAL CELLS PRESENT     NO ORGANISMS SEEN     Performed  at [facility]   Culture     Final   Value: NO GROWTH 2 DAYS     Performed at [facility]   Report Status 11/16/2013 FINAL   Final    Anti-infectives   Start     Dose/Rate Route Frequency Ordered Stop   11/09/13 1600  vancomycin (VANCOCIN) IVPB 750 mg/150 ml premix  Status:  Discontinued     750 mg 150 mL/hr over 60 Minutes Intravenous Every 24 hours 11/09/13 1539 11/10/13 1201   11/08/13 2000  piperacillin-tazobactam (ZOSYN) IVPB 2.25 g     2.25 g 100 mL/hr over 30 Minutes Intravenous 3 times per day 11/08/13 1204     11/08/13 1500  vancomycin (VANCOCIN) IVPB 750 mg/150 ml premix  Status:  Discontinued     750 mg 150 mL/hr over 60 Minutes Intravenous Every 24 hours 11/08/13 1449 11/09/13 1404   11/07/13 1215  vancomycin (VANCOCIN) 250 mg in sodium chloride 0.9 % 100 mL IVPB     250 mg 100 mL/hr over 60 Minutes Intravenous  Once 11/07/13 1209 11/07/13 1559   11/07/13 1200  piperacillin-tazobactam (ZOSYN) IVPB 3.375 g  Status:  Discontinued     3.375 g 12.5 mL/hr over 240 Minutes Intravenous 3 times per day 11/07/13  1154 11/08/13 1204   11/07/13 1000  vancomycin (VANCOCIN) 500 mg in sodium chloride 0.9 % 100 mL IVPB  Status:  Discontinued     500 mg 100 mL/hr over 60 Minutes Intravenous Every 24 hours 11/07/13 0905 11/07/13 1208   11/28/2013 0615  levofloxacin (LEVAQUIN) IVPB 500 mg     500 mg 100 mL/hr over 60 Minutes Intravenous To Surgery 11/15/2013 0605 11/11/2013 0820   11/01/2013 0600  vancomycin (VANCOCIN) IVPB 1000 mg/200 mL premix  Status:  Discontinued     1,000 mg 200 mL/hr over 60 Minutes Intravenous On call to O.R. 11/05/13 0745 11/05/2013 1450   11/23/2013 0600  vancomycin (VANCOCIN) IVPB 1000 mg/200 mL premix  Status:  Discontinued     1,000 mg 200 mL/hr over 60 Minutes Intravenous To Surgery 10/30/13 0730 11/01/13 1740      Assessment: 80 year old male on Day #12 of Zosyn for intra-abdominal coverage.  His renal dysfunction persists with SCr stable at 3.74 today. Current regimen of Zosyn  2.25 gm IV q8h remains appropriate.   9/16 Trach aspirate - NGTD  Plan:  Continue Zosyn 2.25gm IV q8h Follow renal function closely.  F/u cultures.   [place], [address].D., BCPS Clinical Pharmacist Pager: [phone] 11/18/2013 2:09 PM

## 2013-11-18 NOTE — Procedures (Signed)
Intubation Procedure Note MECHEL SCHUTTER [id number] 09-Nov-1927  Procedure: Intubation Indications: ETT needed to be changed  Procedure Details Consent: Unable to obtain consent because of emergent medical necessity. Time Out: Verified patient identification, verified procedure, site/side was marked, verified correct patient position, special equipment/implants available, medications/allergies/relevent history reviewed, required imaging and test results available.  Performed  Medications used: midazolam and fentanyl Equipment used: bougie and  8.82mm cuffed endotracheal tube Using a bougie, the existing 7.5 ETT was removed and a new 8.0 ETT was placed. The original tube had a severely narrowed inner lumen with thick yellow secretions within the tube.  Correct placement of new ETT confirmed by by auscultation, by CXR and ETCO2 monitor Tube secured at 24 cm at the lip  Evaluation Hemodynamic Status: BP stable throughout; O2 sats: stable throughout Patient's Current Condition: stable Complications: No apparent complications Patient did tolerate procedure well. Chest X-ray ordered to verify placement.  CXR: pending.   Pershing Cox, M.D. Pulmonary and Critical Care Medicine Pager [phone] Call E-link with questions [phone] 11/18/2013

## 2013-11-18 NOTE — Plan of Care (Signed)
Problem: Phase I Progression Outcomes Goal: Neurologically stable/at baseline (CEA/CES) Outcome: Progressing occ follows command by opening mouth to command.    Goal: Peripheral pulses at baseline (AAA) Outcome: Progressing doppling pulses in left. Palpated right    Goal: Pain controlled with appropriate interventions Outcome: Progressing Fentanyl drip up to 150 mcg/hr. Requiring boluses of 50 mcg IV with turns. On midazolam 1 mg/HR IV. Goal: Activity progression as ordered Outcome: Progressing Bedrest. Turning every 2 hours.

## 2013-11-18 NOTE — Progress Notes (Signed)
Subjective: Interval History: none. Remains critically ill vent. Not responsive..   Objective: Vital signs in last 24 hours: Temp:  [97.1 F (36.2 C)-98.7 F (37.1 C)] 98.7 F (37.1 C) (09/20 0824) Pulse Rate:  [80-103] 96 (09/20 0845) Resp:  [20-29] 29 (09/20 0845) BP: (79-156)/(44-105) 113/67 mmHg (09/20 0845) SpO2:  [94 %-100 %] 97 % (09/20 0845) FiO2 (%):  [40 %] 40 % (09/20 0900) Weight:  [201 lb 1 oz (91.2 kg)] 201 lb 1 oz (91.2 kg) (09/20 0500)  Intake/Output from previous day: 09/19 0701 - 09/20 0700 In: 2550 [I.V.:681; NG/GT:180; IV Piggyback:200; TPN:1489] Out: 2080 [Urine:1880; Emesis/NG output:200] Intake/Output this shift: Total I/O In: 167 [I.V.:47; TPN:120] Out: -   Diffuse edema. Abdomen distended. No evidence of peripheral ischemia.  Lab Results:  Recent Labs  11/17/13 0500 11/18/13 0430  WBC 21.3* 17.8*  HGB 8.9* 8.3*  HCT 26.1* 24.6*  PLT 162 195   BMET  Recent Labs  11/17/13 0500 11/18/13 0430  NA 143 145  K 3.3* 3.4*  CL 101 104  CO2 25 24  GLUCOSE 110* 126*  BUN 93* 95*  CREATININE 3.71* 3.74*  CALCIUM 7.8* 7.7*    Studies/Results: Ct Abdomen Pelvis Wo Contrast  11/07/2013   CLINICAL DATA:  Status post aortic endograft placement on 24-Nov-2013. Clinical concern for bowel ischemia.  EXAM: CT ABDOMEN AND PELVIS WITHOUT CONTRAST  TECHNIQUE: Multidetector CT imaging of the abdomen and pelvis was performed following the standard protocol without IV contrast.  COMPARISON:  10/24/2013.  FINDINGS: Lung Bases: Emphysema with bilateral lower lobe collapse/ consolidation, left greater than right. Contrast material in the lower esophagus may be related to reflux or dysmotility.  Liver:  Normal uninfused appearance.  Spleen: Normal on infused features.  Stomach: Distended. NG tube tip is in the fundus of the stomach. No gastric wall thickening.  Pancreas: No mass lesion is evident. No dilatation of the main duct.  Gallbladder/Biliary: High attenuation  material in the lumen compatible with vicarious excretion of contrast. No intra or extrahepatic biliary duct dilatation.  Kidneys/Adrenals: No adrenal nodule or mass. Residual contrast material is identified in the cortices of both kidneys in a heterogeneous configuration.  Bowel Loops: Duodenum is normally positioned as is the ligament of Treitz. No evidence for small bowel dilatation. The mid and distal small bowel is opacified in this along with lack of intravenous contrast limits assessment for bowel wall thickening. No gross perienteric edema is evident. There is no evidence for small bowel pneumatosis. There is some pericecal edema/inflammation although no substantial cecal wall thickening is evident. No evidence for colonic pneumatosis. Diverticular changes are noted in the sigmoid colon.  Nodes: No evidence for lymphadenopathy to in the abdomen or pelvis  Vasculature: Aortic endo graft noted within large abdominal aortic aneurysm measuring up to 8.6 cm in diameter. Gas within the lumen of the aorta and external to the stent graft is most likely related to the recent stent placement.  Pelvic Genitourinary: Streak artifact through the lower pelvis from the patient's bilateral hip replacements obscures lower pelvic anatomy.  Bones/Musculoskeletal: Bilateral hip replacement noted. No worrisome lytic or sclerotic osseous abnormality  Body Wall: No evidence for abdominal wall hernia. There is body wall edema in the lower abdomen and pelvis.  Other: There may be a trace amount of free fluid in the pelvis.  IMPRESSION: No definite CT evidence for bowel ischemia. Study is limited by incomplete opacification of small and large bowel and lack of intravenous contrast material, but  no pneumatosis is evident. There is a small amount of edema or inflammation around the cecum, but no associated cecal wall thickening or pneumatosis is evident.   Electronically Signed   By: Kennith Center M.D.   On: 11/07/2013 14:40   Dg  Neck Soft Tissue  11/18/2013   CLINICAL DATA:  Assess endotracheal tube.  EXAM: NECK SOFT TISSUES - 1+ VIEW  COMPARISON:  None.  FINDINGS: Single frontal radiograph of the neck. Endotracheal tube tip projects immediately below the clavicles. The visualized to appears straight in course. LEFT internal jugular central venous catheter, distal tip crossing midline, not imaged. Coarse calcification in the RIGHT neck are likely vascular.  IMPRESSION: Endotracheal tube tip projects below the clavicle head.   Electronically Signed   By: Awilda Metro   On: 11/18/2013 02:02   Dg Chest 2 View  10/30/2013   CLINICAL DATA:  pre op  EXAM: CHEST - 2 VIEW  COMPARISON:  04/15/2010  FINDINGS: Somewhat attenuated peripheral bronchovascular markings and coarse perihilar markings. No focal infiltrate or overt edema. Heart size normal. No effusion. Spondylitic changes at several contiguous levels in the mid thoracic spine.  IMPRESSION: 1. Stable chronic interstitial changes.  No acute disease.   Electronically Signed   By: Oley Balm M.D.   On: 10/30/2013 01:47   US Abdomen Complete  10/03/2013   ADDENDUM REPORT: 10/28/2013 12:47  ADDENDUM: Findings and recommendations were discussed with Dr. Shana Chute at 12:40 p.m. As patient has no abdominal pain, the findings are likely stable/chronic. Further evaluation with CT was recommended on an urgent, but not emergent basis.   Electronically Signed   By: Elberta Fortis M.D.   On: 10/23/2013 12:47   10/24/2013   CLINICAL DATA:  Acute abdominal pain.  EXAM: ULTRASOUND ABDOMEN COMPLETE  COMPARISON:  None.  FINDINGS: Exam somewhat limited due to patient body habitus and abundant overlying bowel gas.  Gallbladder:  No gallstones or wall thickening visualized. No sonographic Murphy sign noted.  Common bile duct:  Diameter: 4 mm.  Liver:  No focal lesion identified. Within normal limits in parenchymal echogenicity.  IVC:  Not visualized.  Pancreas:  Visualized portion unremarkable.   Spleen:  Size and appearance within normal limits.  Right Kidney:  Length: 8.6 cm. Echogenicity within normal limits. No mass or hydronephrosis visualized.  Left Kidney:  Length: 8.7 cm. Echogenicity within normal limits. No mass or hydronephrosis visualized.  Abdominal aorta:  Proximal segment not visualized. Moderate atherosclerotic disease with significant aneurysmal dilatation of the mid to distal abdominal aorta measuring approximately 7.8 x 8.1 cm in its AP and transverse dimensions and extending approximately 10.8 cm in length. There is moderate mural thrombus as the lumen measures approximately 2.2 x 3.2 cm in AP and transverse dimension.  Other findings:  None.  IMPRESSION: Significant aneurysm of the mid to distal abdominal aorta measuring approximately 7.8 x 8.1 cm in its AP and transverse dimensions and extending 10.8 cm in length. Moderate mural thrombus as the lumen measures 2.2 x 3.2 cm. Recommend CT abdomen with and without contrast to exclude contained rupture in this patient with abdominal pain.  These results were called by telephone at the time of interpretation on 10/17/2013 at 11:11 am to Dr. Kevin Fenton SPRUILL's recepeptionist, Frederick Peers, , who verbally acknowledged these results. We are unable to reach Dr. Shana Chute at this time. I therefore called patient directly as patient states he has no known history of abdominal aneurysm and states he has no abdominal pain. We  discussed that he needs further evaluation with CT scan and that Dr. Magda Kiel office will be contacting him to scheduled the exam. We discussed that he should proceed to the nearest ER if he experiences abdominal pain.  Electronically Signed: By: Elberta Fortis M.D. On: 10/11/2013 11:26   Ct Cardiac Morph/pulm Vein W/cm&w/o Ca Score  11/02/2013   ADDENDUM REPORT: 11/02/2013 16:42  ADDENDUM: Over-read:  The heart size appears mildly enlarged. There are calcifications involving the thoracic aorta as well as the LAD, left circumflex  and RCA coronary arteries. Dense calcifications involving the aortic an mitral valves noted. No pericardial effusion. There is no enlarged mediastinal or hilar lymph nodes. No enlarged axillary or supraclavicular adenopathy.  There is no pleural effusion identified. There are moderate to advanced changes of centrilobular and paraseptal emphysema noted. Dependent changes are noted in the lung bases. No airspace consolidation identified. Calcified granuloma is identified in the right middle lobe.  Incidental imaging through the upper abdomen shows no acute findings.  Review of the visualized osseous structures is significant for mild degenerative disc disease within the thoracic spine.  IMPRESSION: 1. Emphysema 2. Atherosclerotic disease including multi vessel coronary artery calcifications. 3. Calcifications of the aortic and mitral valves 4. Prior granulomatous disease.   Electronically Signed   By: Signa Kell M.D.   On: 11/02/2013 16:42   11/02/2013   CLINICAL DATA:  Aorticstenosis  EXAM: Cardiac TAVR CT  TECHNIQUE: The patient was scanned on a Philips 256 scanner. A 100 kV retrospective scan was triggered in the descending thoracic aorta at 111 HU's. Gantry rotation speed was 270 msecs and collimation was .9 mm. No beta blockade or nitro were given. The 3D data set was reconstructed in 5% intervals of the R-R cycle. Systolic and diastolic phases were analyzed on a dedicated work station using MPR, MIP and VRT modes. The patient received 80 cc of contrast.  FINDINGS: Aortic Valve: Tri-leaflet Aortic valve. All 3 leaflets heavily calcified. Moderate calcification of the annulus extending into the intervalvular fibrosa and base of anterior mitral leaflet  Aorta: Mild calcification of ascending root and arch. Moderate mural debris/plaque in arch and descending thoracic aorta. No dissection, aneurysm, penetrating ulcer or protruding plaque  Normal origin of the great arteries  Aorta:  3.4 cm  Sinotubular Junction:   3.2 cm  Aortic Arch:  2.9 cm  Descending Thoracic Aorta:  2.6 cm  Sinus of Valsalva Measurements:  Non-coronary:  35.6 mm  Right -coronary:  37.8 mm  Left -coronary:  36.7 mm  Coronary Artery Height above Annulus:  Left Main:  16 mm  Right Coronary:  17 mm  Virtual Basal Annulus Measurements:  Maximum/Minimum Diameter:  32 mm x 24 mm  Perimeter:  87.9 mm  Area:  563 cm2  Coronary Arteries: Right dominant. Heavily calcified RCA/LAD Height of ostia sufficient for TAVR See cath report  Optimum Fluoroscopic Angle for Delivery: LAO 9 degrees Cranial 0 degrees  IMPRESSION: 1) Heavily calcified tri-leaflet Aortic Valve. Calcification extending into annulus, intervalvular fibrosa and base of anterior mitral leaflet  2) Suitable for 29 mm Sapien XT valve with area of 563 cm2  3) Aortic Arch and descending thoracic aorta with moderate mural plaque and mild calcification. Normal origin of great arteries  4) Coronary artery ostium height suitable for TAVR  5) Optimum angiographic angle for delivery LAO 9 degrees Cranial 0 degrees  Charlton Haws  Electronically Signed: By: Charlton Haws M.D. On: 11/02/2013 11:23   Dg Chest [facility]  1 View  11/18/2013   CLINICAL DATA:  Endotracheal tube repositioning. Respiratory failure.  EXAM: PORTABLE CHEST - 1 VIEW  COMPARISON:  Chest radiograph performed earlier today at 1:38 a.m.  FINDINGS: The patient's endotracheal tube is seen ending 2-3 cm above the carina. A left IJ line is noted ending about the mid SVC. An enteric tube is noted extending below the diaphragm, with the side port noted at the fundus of the stomach.  There is elevation of the right hemidiaphragm. Left basilar airspace opacification may reflect atelectasis or pneumonia. Small bilateral pleural effusions are suspected. No pneumothorax is seen.  The cardiomediastinal silhouette is normal in size. No acute osseous abnormalities are identified. There is chronic narrowing of the right glenohumeral joint.  IMPRESSION: 1.  Endotracheal tube seen ending 2-3 cm above the carina. 2. Elevation of the right hemidiaphragm. Persistent left basilar airspace opacification may reflect atelectasis or pneumonia. Suspect small bilateral pleural effusions.   Electronically Signed   By: Roanna Raider M.D.   On: 11/18/2013 02:32   Dg Chest Port 1 View  11/18/2013   CLINICAL DATA:  Respiratory distress. Assess endotracheal tube position.  EXAM: PORTABLE CHEST - 1 VIEW  COMPARISON:  Chest radiograph performed 11/17/2013  FINDINGS: The patient's endotracheal tube is seen ending 5 cm above the carina. The enteric tube is noted extending below the diaphragm, with the side port seen at the distal esophagus. A left IJ line is noted ending about the mid SVC.  Mild left basilar airspace opacity may reflect atelectasis or possibly pneumonia. Small bilateral pleural effusions are suspected. No pneumothorax is seen.  The cardiomediastinal silhouette is normal in size. No acute osseous abnormalities are identified.  IMPRESSION: 1. Endotracheal tube seen ending 5 cm above the carina. 2. Mild left basilar airspace opacity may reflect atelectasis or possibly mild pneumonia, relatively stable given differences in lung expansion. Small bilateral pleural effusions suspected.   Electronically Signed   By: Roanna Raider M.D.   On: 11/18/2013 02:01   Dg Chest Port 1 View  11/17/2013   CLINICAL DATA:  Follow-up respiratory failure.  EXAM: PORTABLE CHEST - 1 VIEW  COMPARISON:  [id number]  FINDINGS: Endotracheal tube remains in place with tip just below the level of the clavicular heads and tip well above the carina. Left jugular central venous catheter remains, projecting over the SVC. Enteric tube projects over the stomach. Cardiomediastinal silhouette is unchanged, cardiac silhouette upper limits of normal in size. Lung volumes remain low with mild parenchymal opacities remaining in the left greater than right lung bases and likely small bilateral pleural effusions,  overall similar to the prior study.  IMPRESSION: No significant interval change of small bilateral pleural effusions and left greater than right lung opacities, likely atelectasis.   Electronically Signed   By: Sebastian Ache   On: 11/17/2013 08:39   Dg Chest Port 1 View  11/16/2013   CLINICAL DATA:  Respiratory failure.  EXAM: PORTABLE CHEST - 1 VIEW  COMPARISON:  11/15/2013 and 11/14/2013  FINDINGS: Endotracheal tube, central venous catheter, and NG tube appear unchanged and in good position.  There is improved aeration at both lung bases with decrease in the bilateral small pleural effusions.  IMPRESSION: Slight decrease in the effusions. Improved aeration at the lung bases.   Electronically Signed   By: Geanie Cooley M.D.   On: 11/16/2013 08:13   Dg Chest Port 1 View  11/15/2013   CLINICAL DATA:  Respiratory failure  EXAM: PORTABLE CHEST - 1  VIEW  COMPARISON:  11/14/2013  FINDINGS: Endotracheal tube with the tip 5.6 cm above the carina. Nasogastric tube coursing below the diaphragm. Left jugular central venous catheter with the tip projecting over the SVC.  Bilateral small pleural effusions. Bibasilar atelectasis. Mild bilateral interstitial thickening. No pneumothorax. Stable cardiomediastinal silhouette. Unremarkable osseous structures.  IMPRESSION: Bilateral small pleural effusions. Support lines and tubing in satisfactory position.   Electronically Signed   By: Elige Ko   On: 11/15/2013 08:09   Dg Chest Port 1 View  11/14/2013   CLINICAL DATA:  Central catheter placement  EXAM: PORTABLE CHEST - 1 VIEW  COMPARISON:  November 13, 2013  FINDINGS: Left jugular catheter tip is in the superior vena cava. Right central catheter tip is also in the superior vena cava. Endotracheal tube tip is 4.5 cm above the carina. Nasogastric tube tip and side port are below the diaphragm. There is no demonstrable pneumothorax.  There are bilateral effusions with mild bibasilar edema. Heart is enlarged. The pulmonary  vascularity is normal. No adenopathy.  IMPRESSION: Tube and catheter positions as described without pneumothorax. Evidence of congestive heart failure.   Electronically Signed   By: Bretta Bang M.D.   On: 11/14/2013 15:40   Dg Chest Port 1 View  11/13/2013   CLINICAL DATA:  Extubation. Followup basilar atelectasis versus pneumonia.  EXAM: PORTABLE CHEST - 1 VIEW  COMPARISON:  Portable chest x-rays 11/11/2013 dating back to 11/02/2013.  FINDINGS: Right jugular central venous catheter tip projects over the lower SVC. Right jugular introducer sheath tip projects over the upper SVC. Nasogastric to courses below the diaphragm with its tip in the fundus of the stomach. Worsened aeration in the right lung base. Stable dense consolidation in the left lower lobe. Mild pulmonary venous hypertension without overt edema.  IMPRESSION: Support apparatus satisfactory. Worsening atelectasis at the right lung base. Stable dense left lower lobe atelectasis and/or pneumonia.   Electronically Signed   By: Hulan Saas M.D.   On: 11/13/2013 08:10   Dg Chest Portable 1 View  11/11/2013   CLINICAL DATA:  Respiratory failure.  EXAM: PORTABLE CHEST - 1 VIEW  COMPARISON:  Chest x-ray 11/10/2013.  FINDINGS: An endotracheal tube is in place with tip 3.4 cm above the carina. There is a right-sided internal jugular central venous catheter with tip terminating in the distal superior vena cava. A nasogastric tube is seen extending into the stomach, however, the tip of the nasogastric tube extends below the lower margin of the image. Lung volumes are low. Bibasilar opacities may reflect areas of atelectasis and/or consolidation (left greater than right). Small left pleural effusion. Crowding of the pulmonary vasculature, accentuated by low lung volumes, without frank pulmonary edema. Heart size is normal. Upper mediastinal contours are distorted by patient positioning. Atherosclerosis in the thoracic aorta.  IMPRESSION: 1. Support  apparatus, as above. 2. Low lung volumes with bibasilar (left greater than right) atelectasis and/or consolidation, and small left pleural effusion.   Electronically Signed   By: Trudie Reed M.D.   On: 11/11/2013 10:11   Dg Chest Port 1 View  11/10/2013   CLINICAL DATA:  Pneumonitis  EXAM: PORTABLE CHEST - 1 VIEW  COMPARISON:  11/09/2013  FINDINGS: Right internal jugular vascular sheath status post removal of Swan-Ganz catheter. Adjacent central line same location stable. No change in position of endotracheal tube above the carina. NG tube again identified projecting over the stomach.  Mild cardiac enlargement. Limited inspiratory effect. Vascular pattern within normal limits with  no evidence of pulmonary edema. Retrocardiac opacity stable. Mild atelectatic change right lung base stable.  IMPRESSION: Stable bilateral lower lobe opacities.   Electronically Signed   By: Esperanza Heir M.D.   On: 11/10/2013 08:00   Dg Chest Port 1 View  11/09/2013   CLINICAL DATA:  Post abdominal aortic stent graft.  EXAM: PORTABLE CHEST - 1 VIEW  COMPARISON:  11/08/2013; 11/07/2013  FINDINGS: Grossly unchanged enlarged cardiac silhouette and mediastinal contours given reduced lung volumes and patient rotation. Stable positioning of support apparatus including right jugular approach PA catheter coiled overlying the expected location of the main pulmonary outflow tract. No pneumothorax.  Lung volumes remain reduced with unchanged trace effusions and associated bibasilar opacities, left greater than right. No new focal airspace opacities. No evidence of edema. Unchanged bones.  IMPRESSION: 1. Stable positioning of support apparatus including tip of right jugular approach PA catheter coiled over the main pulmonary artery outflow tract. No pneumothorax. 2. Unchanged trace bilateral effusions and associated bibasilar opacities, left greater than right, atelectasis versus infiltrate. 3. No evidence of edema.   Electronically  Signed   By: Simonne Come M.D.   On: 11/09/2013 07:44   Dg Chest Port 1 View  11/08/2013   CLINICAL DATA:  Ventilator  EXAM: PORTABLE CHEST - 1 VIEW  COMPARISON:  11/07/2013  FINDINGS: Endotracheal tube in good position. Swan-Ganz catheter tip in the main pulmonary artery. The catheter tip is coiled in the main pulmonary artery unchanged. Right jugular catheter tip in the SVC. NG tube in the stomach.  Mild bibasilar atelectasis unchanged. Negative for edema. Negative for pneumothorax.  IMPRESSION: Support lines unchanged in position. Swan-Ganz catheter tip is coiled in the main pulmonary artery  Bibasilar atelectasis unchanged.   Electronically Signed   By: Marlan Palau M.D.   On: 11/08/2013 08:06   Dg Chest Port 1 View  11/07/2013   CLINICAL DATA:  Status post EVAR and aortic valvuloplasty.  EXAM: PORTABLE CHEST - 1 VIEW  COMPARISON:  11/16/2013  FINDINGS: Endotracheal tube remains with the tip approximately 3.7 cm above the carina. Swan-Ganz catheter continues to show coiled configuration in the main pulmonary artery. Additional jugular central line tip is in the SVC. Relatively stable bilateral lower lobe atelectasis present, left greater than right. Probable small left pleural effusion remains. No pulmonary edema, pneumothorax or pneumomediastinum. Cardiac and mediastinal contours are stable.  IMPRESSION: No pneumothorax. Stable bilateral lower lobe atelectasis. Stable probable small left pleural effusion.   Electronically Signed   By: Irish Lack M.D.   On: 11/07/2013 08:21   Dg Chest Portable 1 View  11/08/2013   CLINICAL DATA:  Postop for stent graft placement.  EXAM: PORTABLE CHEST - 1 VIEW  COMPARISON:  10/24/2013  FINDINGS: Endotracheal tube is 3.5 cm above the carina. There are two right jugular central lines. One central line is in the SVC region. The other is a Swan-Ganz catheter which appears to be coiled in the main pulmonary outflow tract region. Few patchy densities at the left lung base  are most compatible with atelectasis. No evidence for a pneumothorax.  IMPRESSION: Left basilar atelectasis.  Negative for a pneumothorax.  Swan-Ganz catheter appears to be coiled in the main pulmonary artery region.   Electronically Signed   By: Richarda Overlie M.D.   On: 11/18/2013 15:58   Dg Abd Portable 1v  11/12/2013   CLINICAL DATA:  Abdominal aortic aneurysm endovascular stent graft repair.  EXAM: PORTABLE ABDOMEN - 1 VIEW  COMPARISON:  11/09/2013  FINDINGS: Calcification along the large native aneurysm surrounds the aortic stent graft, unchanged from the prior study.  Normal bowel gas pattern. No evidence of obstruction or generalized adynamic ileus.  Soft tissues are unremarkable.  IMPRESSION: 1. No acute findings.  No change the prior study.   Electronically Signed   By: Amie Portland M.D.   On: 11/12/2013 08:16   Dg Abd Portable 1v  11/09/2013   CLINICAL DATA:  Abdomen distention.  EXAM: PORTABLE ABDOMEN - 1 VIEW  COMPARISON:  CT 11/07/2013.  FINDINGS: NG tube noted in stomach. Soft tissue structures are stable. Abdominal aortic aneurysm with stent graft again noted. No bowel distention. Moderate amount of stool in colon. No free air. Bilateral hip replacements.  IMPRESSION: 1. NG tube noted in stomach.  No bowel distention.  No free air. 2. Under amount of stool in colon. 3. Abdominal aortic aneurysm with stent graft again noted. 4. Bilateral hip replacements.   Electronically Signed   By: Maisie Fus  Register   On: 11/09/2013 09:43   Dg Abd Portable 1v  11/07/2013   CLINICAL DATA:  80 year old male with abdominal pain greater on the right side. Initial encounter.  EXAM: PORTABLE ABDOMEN - 1 VIEW  COMPARISON:  CTA abdomen and pelvis 10/24/2013. Portable chest radiograph from 0641 hr today.  FINDINGS: Portable AP supine view at 0936 hr.  New bifurcated abdominal aortic endograft. Large rim calcified native abdominal aortic aneurysm sac.  Non obstructed bowel gas pattern. Enteric tube looped in the left  upper quadrant at the level of the stomach. Partially visible Swan-Ganz catheter. Bilateral hip arthroplasty hardware partially visible. Increased left lung base opacity, as seen earlier today.  IMPRESSION: 1. Non obstructed bowel gas pattern. Enteric tube looped in the region of the stomach. 2. Left lung base consolidation or collapse. 3. New abdominal aortic endograft since 09/29/2013, large rim calcified native aneurysm sac.   Electronically Signed   By: Augusto Gamble M.D.   On: 11/07/2013 09:55   Ct Cta Abd/pel W/cm &/or W/o Cm  10/22/2013   CLINICAL DATA:  ABDOMINAL PAIN, AAA  EXAM: CT ANGIOGRAPHY ABDOMEN AND PELVIS  TECHNIQUE: Multidetector CT imaging of the abdomen and pelvis was performed using the standard protocol during bolus administration of intravenous contrast. Multiplanar reconstructed images including MIPs were obtained and reviewed to evaluate the vascular anatomy.  CONTRAST:  OMNIPAQUE IOHEXOL 350 MG/ML SOLN  COMPARISON:  Ultrasound from earlier the same day  FINDINGS: ARTERIAL FINDINGS:  Coronary calcifications.  Aorta: Mild atheromatous plaque in the ectatic and torturous mildly tortuous visualized distal descending thoracic aorta. There is more extensive irregular partially calcified plaque in the juxtarenal aorta. There is a fusiform infrarenal aneurysm measuring 8.4 x 8.5 cm maximum transverse diameter, tapering to a diameter of 4.7 cm at the bifurcation. There is a large amount of nonocclusive mural thrombus. No adjacent inflammatory/edematous changes. No retroperitoneal hematoma.  Celiac axis: There is an eccentric origin diverticulum which measures approximately 10 mm diameter. There is mild short-segment narrowing over approximately 1 cm at the level of the median arcuate ligament of the diaphragm, patent distally.  Superior mesenteric: Calcified ostial plaque resulting in short segment mild stenosis of doubtful hemodynamic significance. Patent distally with classic branch anatomy.   Left renal:           Single, patent  Right renal: Single. There is eccentric partially calcified plaque extending from the origin over a length of approximately 2 cm resulting in mild stenosis. Patent distally.  Inferior mesenteric: Short-segment origin occlusion, reconstituted distally by visceral collaterals.  Left iliac: The origin of the common iliac is involved by the aneurysm, measuring 2 cm diameter, tapering to a diameter of 12 mm at the common iliac bifurcation. Internal iliac is tortuous and atheromatous. There is mild tortuosity of the external iliac artery with some scattered plaque, no stenosis or aneurysm. There is origin occlusion of the SFA, distal extent not included on the study.  Right iliac: Ectatic common iliac artery, 14 mm diameter at its origin, tapering to 13 mm. There is scattered plaque in the internal and external iliac arteries without high-grade stenosis or aneurysm. Marked tortuosity of the external iliac artery may be problematic for percutaneous access.  Venous findings:      Dedicated venous phase imaging not obtained.  Review of the MIP images confirms the above findings.  Nonvascular findings: Moderately advanced emphysematous changes in the visualized lung bases with some dependent atelectasis posteriorly. Unremarkable arterial phase evaluation of liver, spleen, adrenal glands, pancreas, right kidney. There is a 17 mm low-attenuation lesion exophytic from the mid portion of the left kidney medial to the collecting system, measuring above simple fluid attenuation. The stomach, small bowel, and colon are nondilated. Normal appendix. Scattered colonic diverticula most numerous in the sigmoid segment, without adjacent inflammatory/ edematous change. Bilateral hip arthroplasty hardware results in streak artifact degrading portions of the scan. Urinary bladder is physiologically distended. No ascites. No free air. No adenopathy. Degenerative disc disease in the lumbar spine most  marked L4-5 and L5-S1.  IMPRESSION: 1. 8.5 cm infrarenal abdominal aortic aneurysm extending across the bifurcation to involve the proximal left common iliac artery. No evidence of rupture or impending rupture. However, the size presents will a significant risk of rupture, and vascular surgical consultation is recommended. 2. Tortuous right iliac arterial system without aneurysm or stenosis. 3. Origin occlusion of the left SFA. 4. 17 mm exophytic left renal mass, possibly hyperdense cyst but incompletely characterized. 5. Colonic diverticulosis.   Electronically Signed   By: Oley Balm M.D.   On: 11-03-2013 19:07   Anti-infectives: Anti-infectives   Start     Dose/Rate Route Frequency Ordered Stop   11/09/13 1600  vancomycin (VANCOCIN) IVPB 750 mg/150 ml premix  Status:  Discontinued     750 mg 150 mL/hr over 60 Minutes Intravenous Every 24 hours 11/09/13 1539 11/10/13 1201   11/08/13 2000  piperacillin-tazobactam (ZOSYN) IVPB 2.25 g     2.25 g 100 mL/hr over 30 Minutes Intravenous 3 times per day 11/08/13 1204     11/08/13 1500  vancomycin (VANCOCIN) IVPB 750 mg/150 ml premix  Status:  Discontinued     750 mg 150 mL/hr over 60 Minutes Intravenous Every 24 hours 11/08/13 1449 11/09/13 1404   11/07/13 1215  vancomycin (VANCOCIN) 250 mg in sodium chloride 0.9 % 100 mL IVPB     250 mg 100 mL/hr over 60 Minutes Intravenous  Once 11/07/13 1209 11/07/13 1559   11/07/13 1200  piperacillin-tazobactam (ZOSYN) IVPB 3.375 g  Status:  Discontinued     3.375 g 12.5 mL/hr over 240 Minutes Intravenous 3 times per day 11/07/13 1154 11/08/13 1204   11/07/13 1000  vancomycin (VANCOCIN) 500 mg in sodium chloride 0.9 % 100 mL IVPB  Status:  Discontinued     500 mg 100 mL/hr over 60 Minutes Intravenous Every 24 hours 11/07/13 0905 11/07/13 1208   11/16/2013 0615  levofloxacin (LEVAQUIN) IVPB 500 mg     500 mg 100 mL/hr  over 60 Minutes Intravenous To Surgery 11/08/2013 0605 11/19/2013 0820   11/23/2013 0600   vancomycin (VANCOCIN) IVPB 1000 mg/200 mL premix  Status:  Discontinued     1,000 mg 200 mL/hr over 60 Minutes Intravenous On call to O.R. 11/05/13 0745 10/31/2013 1450   11/23/2013 0600  vancomycin (VANCOCIN) IVPB 1000 mg/200 mL premix  Status:  Discontinued     1,000 mg 200 mL/hr over 60 Minutes Intravenous To Surgery 10/30/13 0730 11/01/13 1740      Assessment/Plan: s/p Procedure(s) with comments: ABDOMINAL AORTIC ENDOVASCULAR STENT GRAFT REPAIR  (Bilateral) BALLOON AORTIC VALVE VALVULOPLASTY (N/A) - Cooper will start first ENDARTERECTOMY FEMORAL WITH PATCH ANGIOPLASTY (Right) Continue current support. CCM discussing goals of care with family   LOS: 20 days   Shawn Mathews 11/18/2013, 9:43 AM

## 2013-11-18 NOTE — Progress Notes (Signed)
PARENTERAL NUTRITION CONSULT NOTE   Pharmacy Consult for TNA  Indication: Possible bowel ischemia   Allergies  Allergen Reactions  . Penicillins     unknown    Patient Measurements: Height:  (177.8 cm) Weight: 201 lb 1 oz (91.2 kg) IBW/kg (Calculated) : 73  Vital Signs: Temp: 98.4 F (36.9 C) (09/20 0000) Temp src: Oral (09/20 0000) BP: 106/67 mmHg (09/20 0500) Pulse Rate: 94 (09/20 0500) Intake/Output from previous day: 09/19 0701 - 09/20 0700 In: 2467 [I.V.:658; NG/GT:180; IV Piggyback:200; TPN:1429] Out: 1965 [Urine:1765; Emesis/NG output:200] Intake/Output from this shift: Total I/O In: 1033 [I.V.:243; NG/GT:90; IV Piggyback:100; TPN:600] Out: 760 [Urine:710; Emesis/NG output:50]  Labs:  Recent Labs  11/16/13 0430 11/17/13 0500 11/18/13 0430  WBC 23.5* 21.3* 17.8*  HGB 8.8* 8.9* 8.3*  HCT 25.8* 26.1* 24.6*  PLT 134* 162 195     Recent Labs  11/16/13 0430 11/17/13 0500 11/18/13 0430  NA 143 143 145  K 3.3* 3.3* 3.4*  CL 101 101 104  CO2 GLUCOSE 138* 110* 126*  BUN 88* 93* 95*  CREATININE 3.77* 3.71* 3.74*  CALCIUM 7.8* 7.8* 7.7*  MG  --  1.9  --   PHOS  --  6.0*  --   PROT  --   --  5.1*  ALBUMIN  --   --  1.4*  AST  --   --  43*  ALT  --   --  33  ALKPHOS  --   --  104  BILITOT  --   --  3.4*   Estimated Creatinine Clearance: 16.7 ml/min (by C-G formula based on Cr of 3.74).    Recent Labs  11/17/13 2007 11/18/13 0028 11/18/13 0405  GLUCAP 101* 105* 126*    Insulin Requirements in the past 24 hours:  2 unit, On SSI   Assessment: 43 YOM admitted with abdominal pain due to suspected bowel ischemia. Pt is malnourished due to inadequate oral intake prior to admission. Marland Kitchen POD # 12 s/p AAA repair with ischemic bowel.   GI: Watershed bowel ischemia s/p AAA repair. Not sure if pt would survive colon resection Endo: No hx of DM, CBGs controlled  Lytes: Na 145, K 3.4 Renal: SCr 3.74, UOP 0.8 mL/kg/hr,   Weight up  significantly since admission.  Severe hypervolemia - no further diuresis Not a candidate for HD.   Hepatobil: transaminases elevated, t bili 3.4  TG 122 TPN Access: CVC Rt internal Jugular  TPN day#: 8 Current Nutrition:  Clinimix  5/15 at 60 ml/hr, lipids @ 10 ml/hr provides 72 gm protein and 1502 kcals Goal rate 90 ml/hr  Nutritional Goals Per RD:  Kcal: 2000-2150 Protein: 100-110 gm  Plan:  -Change Clinimix E 5/15 to No electrolytes (due to elevated phos X CoCa). Cont at 60 ml/hr with lipids at 10 ml/hr -KCl 10 mEq IV X 3 -Cont MVI daily and cont TE  MWF -advance TPN if fluid status improves -f/u am labs  Taysean Wager, Pharm.D. [phone] 11/18/2013 6:58 AM

## 2013-11-18 NOTE — Progress Notes (Signed)
ETT replaced to to decreased SATS, increased Peak Pressures. MD at bedside and replaced tube successfully with bougie. Tube occluded with dried secretions. New 8.0 ETT placed at 24 ATL. Change was successful.

## 2013-11-19 ENCOUNTER — Inpatient Hospital Stay (HOSPITAL_COMMUNITY): Payer: Medicare Other

## 2013-11-19 LAB — COMPREHENSIVE METABOLIC PANEL
ALBUMIN: 1.3 g/dL — AB (ref 3.5–5.2)
ALT: 28 U/L (ref 0–53)
ANION GAP: 14 (ref 5–15)
AST: 42 U/L — AB (ref 0–37)
Alkaline Phosphatase: 79 U/L (ref 39–117)
BUN: 97 mg/dL — AB (ref 6–23)
CALCIUM: 7.7 mg/dL — AB (ref 8.4–10.5)
CO2: 24 mEq/L (ref 19–32)
CREATININE: 3.79 mg/dL — AB (ref 0.50–1.35)
Chloride: 107 mEq/L (ref 96–112)
GFR calc Af Amer: 15 mL/min — ABNORMAL LOW (ref >90)
GFR calc non Af Amer: 13 mL/min — ABNORMAL LOW (ref >90)
Glucose, Bld: 120 mg/dL — ABNORMAL HIGH (ref 70–99)
Potassium: 2.9 mEq/L — CL (ref 3.7–5.3)
Sodium: 145 mEq/L (ref 137–147)
Total Bilirubin: 3.2 mg/dL — ABNORMAL HIGH (ref 0.3–1.2)
Total Protein: 5 g/dL — ABNORMAL LOW (ref 6.0–8.3)

## 2013-11-19 LAB — GLUCOSE, CAPILLARY
GLUCOSE-CAPILLARY: 120 mg/dL — AB (ref 70–99)
Glucose-Capillary: 111 mg/dL — ABNORMAL HIGH (ref 70–99)
Glucose-Capillary: 120 mg/dL — ABNORMAL HIGH (ref 70–99)

## 2013-11-19 LAB — DIFFERENTIAL
Basophils Absolute: 0 10*3/uL (ref 0.0–0.1)
Basophils Relative: 0 % (ref 0–1)
EOS PCT: 1 % (ref 0–5)
Eosinophils Absolute: 0.2 10*3/uL (ref 0.0–0.7)
Lymphocytes Relative: 4 % — ABNORMAL LOW (ref 12–46)
Lymphs Abs: 0.6 10*3/uL — ABNORMAL LOW (ref 0.7–4.0)
Monocytes Absolute: 0.9 10*3/uL (ref 0.1–1.0)
Monocytes Relative: 6 % (ref 3–12)
NEUTROS PCT: 89 % — AB (ref 43–77)
Neutro Abs: 14 10*3/uL — ABNORMAL HIGH (ref 1.7–7.7)

## 2013-11-19 LAB — CBC
HEMATOCRIT: 22 % — AB (ref 39.0–52.0)
Hemoglobin: 7.4 g/dL — ABNORMAL LOW (ref 13.0–17.0)
MCH: 30.1 pg (ref 26.0–34.0)
MCHC: 33.6 g/dL (ref 30.0–36.0)
MCV: 89.4 fL (ref 78.0–100.0)
Platelets: 207 10*3/uL (ref 150–400)
RBC: 2.46 MIL/uL — AB (ref 4.22–5.81)
RDW: 15.9 % — ABNORMAL HIGH (ref 11.5–15.5)
WBC: 15.7 10*3/uL — ABNORMAL HIGH (ref 4.0–10.5)

## 2013-11-19 LAB — PREALBUMIN: PREALBUMIN: 8.8 mg/dL — AB (ref 17.0–34.0)

## 2013-11-19 LAB — MAGNESIUM: MAGNESIUM: 1.8 mg/dL (ref 1.5–2.5)

## 2013-11-19 LAB — PHOSPHORUS: PHOSPHORUS: 4.9 mg/dL — AB (ref 2.3–4.6)

## 2013-11-19 LAB — TRIGLYCERIDES: Triglycerides: 141 mg/dL (ref <150)

## 2013-11-19 MED ORDER — POTASSIUM CHLORIDE 20 MEQ/15ML (10%) PO LIQD: 40.0000 meq | ORAL | Status: DC

## 2013-11-19 MED ORDER — TRACE MINERALS CR-CU-F-FE-I-MN-MO-SE-ZN IV SOLN
INTRAVENOUS | Status: DC
  Filled 2013-11-19: qty 2000

## 2013-11-19 MED ORDER — POTASSIUM CHLORIDE 10 MEQ/50ML IV SOLN
10.0000 meq | INTRAVENOUS | Status: DC
  Administered 2013-11-19 (×6): 10 meq via INTRAVENOUS
  Filled 2013-11-19 (×2): qty 50

## 2013-11-19 MED ORDER — FAT EMULSION 20 % IV EMUL
250.0000 mL | INTRAVENOUS | Status: DC
  Filled 2013-11-19: qty 250

## 2013-11-19 NOTE — Progress Notes (Signed)
PARENTERAL NUTRITION CONSULT NOTE - FOLLOW UP  Pharmacy Consult:  TPN Indication:  Possible bowel ischemia   Allergies  Allergen Reactions  . Penicillins     unknown    Patient Measurements: Height:  (177.8 cm) Weight: 199 lb 1.2 oz (90.3 kg) IBW/kg (Calculated) : 73  Vital Signs: Temp: 98.4 F (36.9 C) (09/21 0348) Temp src: Axillary (09/21 0348) BP: 108/73 mmHg (09/21 0600) Pulse Rate: 91 (09/21 0600) Intake/Output from previous day: 09/20 0701 - 09/21 0700 In: 2738 [I.V.:648; NG/GT:60; IV Piggyback:400; TPN:1630] Out: 2120 [Urine:1820; Emesis/NG output:300]  Labs:  Recent Labs  11/17/13 0500 11/18/13 0430 11/19/13 0352  WBC 21.3* 17.8* 15.7*  HGB 8.9* 8.3* 7.4*  HCT 26.1* 24.6* 22.0*  PLT 162 195 207     Recent Labs  11/17/13 0500 11/18/13 0430 11/19/13 0352  NA 143 145 145  K 3.3* 3.4* 2.9*  CL 101 104 107  CO2 GLUCOSE 110* 126* 120*  BUN 93* 95* 97*  CREATININE 3.71* 3.74* 3.79*  CALCIUM 7.8* 7.7* 7.7*  MG 1.9  --  1.8  PHOS 6.0*  --  4.9*  PROT  --  5.1* 5.0*  ALBUMIN  --  1.4* 1.3*  AST  --  43* 42*  ALT  --  33 28  ALKPHOS  --  104 79  BILITOT  --  3.4* 3.2*  TRIG  --   --  141   Estimated Creatinine Clearance: 16.4 ml/min (by C-G formula based on Cr of 3.79).    Recent Labs  11/18/13 1559 11/18/13 2329 11/19/13 0343  GLUCAP 105* 115* 111*    Insulin Requirements in the past 24 hours:  3 units sensitive SSI  Assessment: 86 YOM admitted with abdominal pain due to suspected bowel ischemia. Patient is malnourished due to inadequate oral intake prior to admission.  He is s/p AAA repair with confirmed ischemic bowel.  Pharmacy managing TPN for nutritional support.  GI: watershed bowel ischemia s/p AAA repair. Not sure if pt would survive colon resection.  Prealbumin low at 5.9 on 9/13 - PPI IV. Endo: no hx DM, CBGs well controlled with minimal SSI use Lytes: removed from TPN 9/20 - K+ 2.9 (6 runs ordered), Phos  elevated at 4.9, others WNL Renal: SCr 3.79.  Severe hypervolemia - no further diuresis, good UOP 0.8 ml/kg/hr - not a candidate for HD.  Weight 161 lbs on admit, now 199 lbs. Cards: BP soft, HR high normal - not on pressor Hepatobil: AST mildly elevated, tbili improved to 3.2, others WNL.  TG WNL but trending up. ID: Zosyn for intra-abd infxn + sepsis, afebrile, WBC trending down Pulm: intubated, FiO2 40% - Duonebs Neuro: Fentanyl + Versed gtts - GCS 10, CPOT 0, RASS -1 TPN Access: right IJ TPN day#: 9  Current Nutrition:  TPN  Nutritional Goals: 2000-2150 kCal and 100-110 gm protein daily   Plan:  - Add back lytes to TPN: Clinimix E 5/15 at 60 ml/hr (goal 90 ml/hr) + IVFE at 10 ml/hr.  TPN providing 1502 kCal and 72gm of protein daily, meeting 75% and 72% of minimal needs. - Daily multivitamin and trace elements.  Watch tbili and change trace elements back to MWF if jaundice is documented or if tbili rises > 6. - D/C SSI/CBG checks for now.  If TPN is advanced to goal rate and AM glucose rises, will add back. - F/U GoC and volume status to advance TPN to goal rate - F/U AM  labs    [place] D. Laney Potash, PharmD, BCPS Pager:  [phone] 11/19/2013, 8:13 AM

## 2013-11-19 NOTE — Progress Notes (Signed)
eLink Physician-Brief Progress Note Patient Name: Shawn Mathews DOB: Apr 17, 1927 MRN: [id number]   Date of Service  11/19/2013  HPI/Events of Note  Hypokalemia  eICU Interventions  Potassium replaced     Intervention Category Intermediate Interventions: Electrolyte abnormality - evaluation and management  Milee Qualls 11/19/2013, 5:00 AM

## 2013-11-19 NOTE — Progress Notes (Signed)
Thank you for consulting the Palliative Medicine Team at [facility] to meet your patient's and family's needs.   The reason that you asked [place] to see your patient is  For  Clarification of GOC and options  We have scheduled your patient for a meeting: tomorrow 11-20-13 @ 0900  Lorinda Creed NP  Palliative Medicine Team Team Phone # [phone] Pager [phone]

## 2013-11-19 NOTE — Progress Notes (Addendum)
  [facility] Progress Note  11/19/2013 7:53 AM 13 Days Post-Op  Subjective:  Sedated on vent.   Tmax 99.4 BP sys 70s-140s FiO2 98% .4  Filed Vitals:   11/19/13 0749  BP: 107/89  Pulse: 92  Temp:   Resp: 27    Physical Exam: Constitutional: ill-appearing male sedated on vent.  Abdomen: Distended. Hypoactive bowel sounds. Grimaces to palpation.  Extremities:  Strong doppler signals of right DP. Weaker DP doppler signal left.  3+ edema.   CBC    Component Value Date/Time   WBC 15.7* 11/19/2013 0352   RBC 2.46* 11/19/2013 0352   HGB 7.4* 11/19/2013 0352   HCT 22.0* 11/19/2013 0352   PLT 207 11/19/2013 0352   MCV 89.4 11/19/2013 0352   MCH 30.1 11/19/2013 0352   MCHC 33.6 11/19/2013 0352   RDW 15.9* 11/19/2013 0352   LYMPHSABS 0.6* 11/19/2013 0352   MONOABS 0.9 11/19/2013 0352   EOSABS 0.2 11/19/2013 0352   BASOSABS 0.0 11/19/2013 0352    BMET    Component Value Date/Time   NA 145 11/19/2013 0352   K 2.9* 11/19/2013 0352   CL 107 11/19/2013 0352   CO2 24 11/19/2013 0352   GLUCOSE 120* 11/19/2013 0352   BUN 97* 11/19/2013 0352   CREATININE 3.79* 11/19/2013 0352   CALCIUM 7.7* 11/19/2013 0352   GFRNONAA 13* 11/19/2013 0352   GFRAA 15* 11/19/2013 0352    INR    Component Value Date/Time   INR 1.75* 11/10/2013 0355     Intake/Output Summary (Last 24 hours) at 11/19/13 0753 Last data filed at 11/19/13 0700  Gross per 24 hour  Intake   2738 ml  Output   2120 ml  Net    618 ml     Assessment:  80 y.o. male is s/p:  Valvuloplasty, EVAR for large AAA, Abd pain of unknown etiology   13 Days Post-Op  Plan: -No acute vascular changes in extremities.  -Still requiring pressors.  -Not making progress with vent weaning.  -Hypokalemia: replete.  -Creatinine slightly increasing to 3.79. -Goals of care to be discussed today.  -DVT prophylaxis:  SCDs   Maris Berger, PA-C [facility] Office: [phone] Pager:  [phone] 11/19/2013 7:53 AM  Addendum  I have independently interviewed and examined the patient, and I agree with the physician assistant's findings.  Pt not making much progress over the last part of the week.  No longer on pressors but no bowel function.  Still no definitive diagnosis for this patient's abd pain.  At this point, I suspect this patient would not be interested in prolonged intubation and I have the same concerns with a potentially negative laparotomy killing the patient.  Will go ahead and get palliative care's involvement to facilitate an end of life decision.   Will contact family later today to update them.  [name], MD [facility] [place] Office: [phone] Pager: [phone]  11/19/2013, 9:12 AM

## 2013-11-19 NOTE — Progress Notes (Addendum)
PULMONARY / CRITICAL CARE MEDICINE   Name: Shawn Mathews MRN: [id number] DOB: Feb 20, 1928    ADMISSION DATE:  10/23/2013 CONSULTATION DATE:  11/06/2013  REFERRING MD :  Imogene Burn  CHIEF COMPLAINT:  Post op vent management  INITIAL PRESENTATION: 80 y/o male admitted on 9/8 with abdominal pain felt to be due to AAA, found to have critical symptomatic aortic stenosis. Underwent endovascular AAA repair on 9/8 with valvuloplasty so PCCM consulted for post op vent management.  STUDIES:  8/31 CT angio AB > AAA 8.5 cm, tortuous R iliac arterial system, occluded SFA, 17 mm renal mass left 9/04 CT angio heart/chest> emphysema, calcified mitral and aortic valves, calcified trileaflet aortic valve  SIGNIFICANT EVENTS: 9/08 Aortic valvuloplasty, bilat common fem artery cannulation, R iliofemoral endarterectomy, repair left common femoral artery, aortogram, repair of aorta with bifurcated prosthesis, aortic valvuloplasty 9/09 worsening shock, tachy  9/14 Failed SBT. Wheezing. Edema pattern on CXR. Lasix X 1 ordered. BDs changed to scheduled 9/15. Failed SBT. Wheezing improved. Further diuresis  9/17 Tolerated PS 10 cm H2O. Advanced directives discussed  with daughter and son in law. Agreed to continued maximum support for a few more days at most (into early next week). If not substantially improving by then, move to comfort care and vent withdrawal. No CPR/ACLS, no HD, no trach tube. Further diuresis administered 9/18 Tolerates PS 5 cm H2O for brief stints. Cr rising. No further diuresis 9/20 Increased peak pressures >> ETT changed and improved 9/21 Family discussion >> decision for no escalation, and will transition to comfort care/vent withdrawal once family arrives  SUBJECTIVE:  No progress with vent weaning.  Still requiring high levels of sedation/analgesia.  VITAL SIGNS: Temp:  [97.3 F (36.3 C)-99.4 F (37.4 C)] 98.8 F (37.1 C) (09/21 0819) Pulse Rate:  [84-105] 94 (09/21 0800) Resp:  [20-33] 30  (09/21 0800) BP: (77-142)/(40-115) 99/40 mmHg (09/21 0800) SpO2:  [94 %-100 %] 98 % (09/21 0800) FiO2 (%):  [40 %] 40 % (09/21 0749) Weight:  [199 lb 1.2 oz (90.3 kg)] 199 lb 1.2 oz (90.3 kg) (09/21 0500) VENTILATOR SETTINGS: Vent Mode:  [-] PRVC FiO2 (%):  [40 %] 40 % Set Rate:  [20 bmp] 20 bmp Vt Set:  [580 mL] 580 mL PEEP:  [5 cmH20] 5 cmH20 Plateau Pressure:  [16 cmH20-25 cmH20] 17 cmH20 INTAKE / OUTPUT:  Intake/Output Summary (Last 24 hours) at 11/19/13 0921 Last data filed at 11/19/13 0800  Gross per 24 hour  Intake 2578.5 ml  Output   2190 ml  Net  388.5 ml    PHYSICAL EXAMINATION: General: ill appearing HEENT: ETT in place PULM:  Faint b/l wheeze CV: regular, 3/6 SM AB: diffusely tender, +bowel sounds Ext: anasarca   LABS: CBC Recent Labs     11/17/13  0500  11/18/13  0430  11/19/13  0352  WBC  21.3*  17.8*  15.7*  HGB  8.9*  8.3*  7.4*  HCT  26.1*  24.6*  22.0*  PLT  162  195  207    BMET Recent Labs     11/17/13  0500  11/18/13  0430  11/19/13  0352  NA  143  145  145  K  3.3*  3.4*  2.9*  CL  101  104  107  CO2  BUN  93*  95*  97*  CREATININE  3.71*  3.74*  3.79*  GLUCOSE  110*  126*  120*    Electrolytes  Recent Labs     11/17/13  0500  11/18/13  0430  11/19/13  0352  CALCIUM  7.8*  7.7*  7.7*  MG  1.9   --   1.8  PHOS  6.0*   --   4.9*    Sepsis Markers Recent Labs     11/17/13  0500  PROCALCITON  17.89    Liver Enzymes Recent Labs     11/18/13  0430  11/19/13  0352  AST  43*  42*  ALT  33  28  ALKPHOS  104  79  BILITOT  3.4*  3.2*  ALBUMIN  1.4*  1.3*    Glucose Recent Labs     11/18/13  0820  11/18/13  1208  11/18/13  1559  11/18/13  2329  11/19/13  0343  11/19/13  0816  GLUCAP  106*  127*  105*  115*  111*  120*    Imaging Dg Neck Soft Tissue  11/18/2013   CLINICAL DATA:  Assess endotracheal tube.  EXAM: NECK SOFT TISSUES - 1+ VIEW  COMPARISON:  None.  FINDINGS: Single frontal radiograph  of the neck. Endotracheal tube tip projects immediately below the clavicles. The visualized to appears straight in course. LEFT internal jugular central venous catheter, distal tip crossing midline, not imaged. Coarse calcification in the RIGHT neck are likely vascular.  IMPRESSION: Endotracheal tube tip projects below the clavicle head.   Electronically Signed   By: Awilda Metro   On: 11/18/2013 02:02   Dg Chest Port 1 View  11/18/2013   CLINICAL DATA:  Endotracheal tube repositioning. Respiratory failure.  EXAM: PORTABLE CHEST - 1 VIEW  COMPARISON:  Chest radiograph performed earlier today at 1:38 a.m.  FINDINGS: The patient's endotracheal tube is seen ending 2-3 cm above the carina. A left IJ line is noted ending about the mid SVC. An enteric tube is noted extending below the diaphragm, with the side port noted at the fundus of the stomach.  There is elevation of the right hemidiaphragm. Left basilar airspace opacification may reflect atelectasis or pneumonia. Small bilateral pleural effusions are suspected. No pneumothorax is seen.  The cardiomediastinal silhouette is normal in size. No acute osseous abnormalities are identified. There is chronic narrowing of the right glenohumeral joint.  IMPRESSION: 1. Endotracheal tube seen ending 2-3 cm above the carina. 2. Elevation of the right hemidiaphragm. Persistent left basilar airspace opacification may reflect atelectasis or pneumonia. Suspect small bilateral pleural effusions.   Electronically Signed   By: Roanna Raider M.D.   On: 11/18/2013 02:32   Dg Chest Port 1 View  11/18/2013   CLINICAL DATA:  Respiratory distress. Assess endotracheal tube position.  EXAM: PORTABLE CHEST - 1 VIEW  COMPARISON:  Chest radiograph performed 11/17/2013  FINDINGS: The patient's endotracheal tube is seen ending 5 cm above the carina. The enteric tube is noted extending below the diaphragm, with the side port seen at the distal esophagus. A left IJ line is noted ending  about the mid SVC.  Mild left basilar airspace opacity may reflect atelectasis or possibly pneumonia. Small bilateral pleural effusions are suspected. No pneumothorax is seen.  The cardiomediastinal silhouette is normal in size. No acute osseous abnormalities are identified.  IMPRESSION: 1. Endotracheal tube seen ending 5 cm above the carina. 2. Mild left basilar airspace opacity may reflect atelectasis or possibly mild pneumonia, relatively stable given differences in lung expansion. Small bilateral pleural effusions suspected.   Electronically Signed   By: Roanna Raider  M.D.   On: 11/18/2013 02:01    ASSESSMENT / PLAN:  PULMONARY ETT 9/8 >>   A:  Prolonged VDRF.  AECOPD - resolved. Pulmonary edema. P:   Transition to comfort care and extubation when family arrives later today or tomorrow Continue scheduled BD's  CARDIOVASCULAR Lt IJ CVL 9/16 >>  A:  Hemorrhagic/cardiogenic shock >> resolved. P:  DNR  RENAL A:  AKI, nonoliguric 2nd to hypotension and diuresis. CKD. Severe hypervolemia - Not a candidate for HD. Hypokalemia. P:   Defer further lab testing  GASTROINTESTINAL A:  Bowel ischemia s/p AAA repair. Protein calorie malnutrition. P:   Protonix for SUP D/c TPN after current bag runs out  HEMATOLOGIC A:   Anemia of critical illness and chronic disease. P:  Defer further lab testing SCD's for DVT prevention  INFECTIOUS A:   Sepsis with elevated procalcitonin (43.5 on 9/16) >> source unclear, but likely abdominal. P:   Day 13 zosyn, started 9/09 >> continue for now  ENDOCRINE A:   Hyperglycemia without prior hx of DM, controlled  P:   D/c SSI  NEUROLOGIC A:   ICU associated discomfort/agitation P:   Continue sedation while on vent  Goals of Care >> DNR  TODAY'S SUMMARY:  Spoke with pt's two daughters, Para March and Liborio Nixon, over the phone.  They are both in agreement that their father would not want to continue on life support in the condition he  is in.  They are in agreement that focus of care should shift toward comfort.  As such, plan is to proceed with terminal extubation once family arrives later today, or tomorrow.  I reviewed this process with them.  He is DNR, and no escalation of care.  Family will inform medical staff when they are ready to transition to comfort measures and proceed with extubation.  Plan of care d/w Dr. Imogene Burn.  CC time 35 minutes.  Coralyn Helling, MD [facility] Pulmonary/Critical Care 11/19/2013, 9:21 AM Pager:  [phone] After 3pm call: [phone]

## 2013-11-19 NOTE — Progress Notes (Signed)
   Daily Progress Note  Discussed with bedside family members the status of the patient.  Family meeting tomorrow with Palliative care facilitating to try to clarify end-of-life care.  [name], MD [facility] [place] Office: [phone] Pager: [phone]  11/19/2013, 4:40 PM

## 2013-11-19 NOTE — Progress Notes (Signed)
CRITICAL VALUE ALERT  Critical value received:  K 2.9  Date of notification:  11/19/13  Time of notification:  0458  Critical value read back: yes   Nurse who received alert:  A. Lamar Benes RN  MD notified (1st page):  Dr. Darrick Penna CCM

## 2013-11-20 DIAGNOSIS — Z515 Encounter for palliative care: Secondary | ICD-10-CM

## 2013-11-20 DIAGNOSIS — R0989 Other specified symptoms and signs involving the circulatory and respiratory systems: Secondary | ICD-10-CM

## 2013-11-20 DIAGNOSIS — R0600 Dyspnea, unspecified: Secondary | ICD-10-CM

## 2013-11-20 DIAGNOSIS — R0609 Other forms of dyspnea: Secondary | ICD-10-CM

## 2013-11-20 DIAGNOSIS — R52 Pain, unspecified: Secondary | ICD-10-CM

## 2013-11-20 LAB — GLUCOSE, CAPILLARY
Glucose-Capillary: 82 mg/dL (ref 70–99)
Glucose-Capillary: 68 mg/dL — ABNORMAL LOW (ref 70–99)
Glucose-Capillary: 76 mg/dL (ref 70–99)

## 2013-11-20 NOTE — Progress Notes (Signed)
Had discussion with pt's two daughters yesterday.  It appeared that they were both in agreement that pt would not want trach and long term vent support.  He has not made any progress in vent weaning, and I don't think he will be able to.  They were in agreement yesterday to proceed with vent withdrawal and implement comfort measures.  Since then palliative care meeting has been arranged for today.  I will defer any additional PCCM interventions at this time.  My recommendation is to proceed with plan outlined yesterday to proceed with vent withdrawal.  Coralyn Helling, MD [facility] Pulmonary/Critical Care 11/20/2013, 7:33 AM Pager:  [phone] After 3pm call: [phone]

## 2013-11-20 NOTE — Consult Note (Signed)
Patient Shawn Mathews      DOB: 1927/12/03      ZOX:[id number]     Consult Note from the Palliative Medicine Team at [facility]    Consult Requested by: Dr Shawn Mathews     PCP: Shawn Corn, MD Reason for Consultation: Clarification of GOC and options     Phone [phone]  Assessment of patients Current state:  80 y/o male admitted on 9/8 with abdominal pain felt to be due to AAA, found to have critical symptomatic aortic stenosis. Underwent endovascular AAA repair on 9/8 with valvuloplasty.  Unable to wean from vent and overall poor response to medical interventions.  Family is faced with advanced directives and anticipatory care needs   Consult is for review of medical treatment options, clarification of goals of care and end of life issues, disposition and options, and symptom recommendation.  This NP Shawn Mathews reviewed medical records, received report from team, assessed the patient and then meet at the patient's bedside along with his daughter Shawn Mathews and her husband  to discuss diagnosis prognosis, GOC, EOL wishes disposition and options.  Family to bring in copy of AD and HPOA later today  A detailed discussion was had today regarding advanced directives.  Concepts specific to code status, artifical feeding and hydration, continued IV antibiotics and rehospitalization was had.  The difference between a aggressive medical intervention path  and a palliative comfort care path for this patient at this time was had.  Values and goals of care important to patient and family were attempted to be elicited.  Concept of Hospice and Palliative Care were discussed  Natural trajectory and expectations at EOL were discussed.  Questions and concerns addressed.  Hard Choices booklet left for review. Family encouraged to call with questions or concerns.  PMT will continue to support holistically.   Goals of Care: 1.  Code Status: DNR/DNI-comfort is priority of care   2.  Scope of Treatment:  Focus of care is comfort.  Begin to minimize interventions; ie antibiotics, labs, diagnostics.  Plan is to liberate from the ventilator tomorrow at 0900.  Family members are planning to visit throughout the day. This NP will be at the bedside at 0900  3. Disposition:  Once liberated from ventilator expect hospital death   4. Symptom Management:   1. Anxiety/Agitation: Versed 2 mg IV every 1 hr prn 2. Pain/Dyspnea: Fentanyl IV 25-50 mcg every 1 hr prn 3. Failure to thrive:  Focus is comfort   5. Psychosocial:  Emotional support to family at bedside.  They verbalize an understanding of overall poor prognosis, wish to wean from the ventilator and for comfort and quality and dignity.   6. Spiritual:  Spiritual care consulted  Brief HPI: 80 yo male s/p AAA repair and valvuloplasty for severe aortic stenosis.  Unable to wean from ventilator, family faced with advanced directives decsions   ROS: unable to illicit, patient is intubated   PMH:  Past Medical History  Diagnosis Date  . Hypertension   . Gout   . Glaucoma   . COPD (chronic obstructive pulmonary disease)      PSH: Past Surgical History  Procedure Laterality Date  . Total hip arthroplasty    . Lumbar disc surgery    . Abdominal aortic endovascular stent graft Bilateral 11/09/2013    Procedure: ABDOMINAL AORTIC ENDOVASCULAR STENT GRAFT REPAIR ;  Surgeon: Shawn Hertz, MD;  Location: [facility] OR;  Service: Vascular;  Laterality: Bilateral;  . Balloon aortic valve  valvuloplasty N/A 11/14/2013    Procedure: BALLOON AORTIC VALVE VALVULOPLASTY;  Surgeon: Shawn Bollman, MD;  Location: [facility] OR;  Service: Cardiovascular;  Laterality: N/A;  [name] will start first  . Endarterectomy femoral Right 11/14/2013    Procedure: ENDARTERECTOMY FEMORAL WITH PATCH ANGIOPLASTY;  Surgeon: Shawn Hertz, MD;  Location: [facility] OR;  Service: Vascular;  Laterality: Right;   I have reviewed the FH and SH and  If appropriate update it with new  information. Allergies  Allergen Reactions  . Penicillins     unknown   Scheduled Meds: . antiseptic oral rinse  7 mL Mouth Rinse QID  . chlorhexidine  15 mL Mouth Rinse BID  . dorzolamide  1 drop Both Eyes BID  . ipratropium-albuterol  3 mL Nebulization Q6H  . pantoprazole (PROTONIX) IV  40 mg Intravenous Q24H  . piperacillin-tazobactam (ZOSYN)  IV  2.25 g Intravenous 3 times per day   Continuous Infusions: . fentaNYL infusion INTRAVENOUS 250 mcg/hr (11/20/13 0600)  . midazolam (VERSED) infusion 1 mg/hr (11/20/13 0600)   PRN Meds:.sodium chloride, acetaminophen, acetaminophen, albuterol, fentaNYL, midazolam, ondansetron, sodium chloride    BP 96/57  Pulse 90  Temp(Src) 98 F (36.7 C) (Axillary)  Resp 25  Ht  (1.778 m)  Wt 90.3 kg (199 lb 1.2 oz)  BMI 28.56 kg/m2  SpO2 99%     Intake/Output Summary (Last 24 hours) at 11/20/13 0927 Last data filed at 11/20/13 0800  Gross per 24 hour  Intake 1383.5 ml  Output   2160 ml  Net -776.5 ml    Physical Exam:  General: ill appearing, sedated and intubated HEENT:  Moist buccal membranes  Chest:   bilaterally equal air movement  CVS: RRR Abdomen:distended, decreased BS Ext: +3 edema X 4 Neuro:  Opens eyes to stimulation, unable to follow commands  Labs: CBC    Component Value Date/Time   WBC 15.7* 11/19/2013 0352   RBC 2.46* 11/19/2013 0352   HGB 7.4* 11/19/2013 0352   HCT 22.0* 11/19/2013 0352   PLT 207 11/19/2013 0352   MCV 89.4 11/19/2013 0352   MCH 30.1 11/19/2013 0352   MCHC 33.6 11/19/2013 0352   RDW 15.9* 11/19/2013 0352   LYMPHSABS 0.6* 11/19/2013 0352   MONOABS 0.9 11/19/2013 0352   EOSABS 0.2 11/19/2013 0352   BASOSABS 0.0 11/19/2013 0352    BMET    Component Value Date/Time   NA 145 11/19/2013 0352   K 2.9* 11/19/2013 0352   CL 107 11/19/2013 0352   CO2 24 11/19/2013 0352   GLUCOSE 120* 11/19/2013 0352   BUN 97* 11/19/2013 0352   CREATININE 3.79* 11/19/2013 0352   CALCIUM 7.7* 11/19/2013 0352    GFRNONAA 13* 11/19/2013 0352   GFRAA 15* 11/19/2013 0352    CMP     Component Value Date/Time   NA 145 11/19/2013 0352   K 2.9* 11/19/2013 0352   CL 107 11/19/2013 0352   CO2 24 11/19/2013 0352   GLUCOSE 120* 11/19/2013 0352   BUN 97* 11/19/2013 0352   CREATININE 3.79* 11/19/2013 0352   CALCIUM 7.7* 11/19/2013 0352   PROT 5.0* 11/19/2013 0352   ALBUMIN 1.3* 11/19/2013 0352   AST 42* 11/19/2013 0352   ALT 28 11/19/2013 0352   ALKPHOS 79 11/19/2013 0352   BILITOT 3.2* 11/19/2013 0352   GFRNONAA 13* 11/19/2013 0352   GFRAA 15* 11/19/2013 0352     Time In Time Out Total Time Spent with Patient Total Overall Time  0830 0945 70 min 75  min    Greater than 50%  of this time was spent counseling and coordinating care related to the above assessment and plan.   Shawn Creed NP  Palliative Medicine Team Team Phone # [phone] Pager [phone]  Discussed with Dr Shawn Mathews and Dr Imogene Burn

## 2013-11-20 NOTE — Progress Notes (Signed)
   Daily Progress Note  Appreciate all the hard work from everyone involved with this gentleman's care.  The plan at this point is comfort measures and terminal extubation tomorrow.  I met with the family and everyone is comfortable with the plan.  More family will be by tomorrow before extubation.  Adele Barthel, MD [facility] Office: [phone] Pager: [phone]  11/20/2013, 10:04 AM

## 2013-11-20 NOTE — Progress Notes (Signed)
Chaplain responded to consult to visit with Pt and family. The atmosphere amidst the family was very calm, engaging and light. They were smiling and laughing when I walked in. After initial conversation and introductions family asked Chaplain to pray.   Chaplain offered up a prayer and noted that he was available for emotional support and to page as needed. Chaplain informed family that he will return again tomorrow to follow-up.   Shawn Mathews 11/20/2013 4:54 PM [phone]

## 2013-11-21 MED ORDER — FENTANYL BOLUS VIA INFUSION
50.0000 ug | INTRAVENOUS | Status: DC | PRN
  Administered 2013-11-21 (×3): 50 ug via INTRAVENOUS
  Administered 2013-11-21: 100 ug via INTRAVENOUS
  Administered 2013-11-21: 50 ug via INTRAVENOUS
  Filled 2013-11-21 (×2): qty 200

## 2013-11-21 MED ORDER — MIDAZOLAM BOLUS VIA INFUSION
5.0000 mg | INTRAVENOUS | Status: DC | PRN
Start: 2013-11-21 — End: 2013-11-21
  Administered 2013-11-21 (×3): 1 mg via INTRAVENOUS
  Filled 2013-11-21: qty 20

## 2013-11-21 MED ORDER — MIDAZOLAM HCL 5 MG/ML IJ SOLN
10.0000 mg/h | INTRAMUSCULAR | Status: DC
  Filled 2013-11-21: qty 10

## 2013-11-21 MED ORDER — SODIUM CHLORIDE 0.9 % IV SOLN: 100.0000 ug/h | INTRAVENOUS | Status: DC

## 2013-11-26 NOTE — Discharge Summary (Signed)
Physician Death Summary  Patient ID: LOCHLAN GRYGIEL MRN: [id number] DOB/AGE: 06/08/1927 80 y.o.  Admit date: 10/07/2013 Discharge date: Dec 17, 2013  Admission Diagnosis: 1.  Large abdominal aortic aneurysm   Discharge Diagnoses:  1.  Large abdominal aortic aneurysm 2.  Severe aortic stenosis 3.  CAD 4.  Abdominal pain 5.  Acute kidney failure: contrast induced nephropathy vs transient ischemic nephropathy 6.  Protein-calorie malnutrition, severe 7.  Septic shock 8.  Acute respiratory failure  Secondary Diagnoses: Active Problems:   AAA (abdominal aortic aneurysm) without rupture   Protein-calorie malnutrition, severe   Critical aortic valve stenosis   Cardiogenic shock   AKI (acute kidney injury)   Anasarca   Acute respiratory failure with hypoxia   DNR (do not resuscitate)   Palliative care encounter   Dyspnea   Pain, generalized   Operations: 1.  Cardiac catheterization: 11/04/2013 2.  Valvuloplasty, EVAR: 11/07/13 (Dr. Burt Knack, Dr. Rudie Meyer)  Discharged Condition: deceased  Cause of death: respiratory failure  Hospital Course:  DANEN LAPAGLIA is a 80 y.o. male who was seen in the ED on 10/07/2013 with abdominal pain and incidentally found large abdominal aortic aneurysm 8.5 cm.  On exam, he also had a murmur that I felt needed further evaluation with Cardiology.  This murmur turned out to be severe aortic stenosis.  Work-up included: echocardiogram and cardiac catheterization.  CT Surgery was also consulted by Cardiology to evaluate for possible TAVR.  It was felt his risk was too high for combined TAVR/EVAR.  It was felt that an attempt at Valvuloplasty of the aortic valve as a temporizing maneuver followed by EVAR was indicated as his annual rupture rate for his abdominal aortic aneurysm was 30-50%.  After preoperative optimization, the patient was brought back to the OR on 11/04/2013 for Valvuloplasty, EVAR, and R iliofemoral endarterectomy, thrombectomy, and bovine patch angioplasty.   This case was notable for conversion to open access of the right common femoral artery due to severe disease in the right distal external iliac artery and common femoral artery.  Intraoperatively, the patient's blood pressure was liable and he was intermittently normotensive and hypotensive.  The patient was brought back to the SICU post-operative and was recovering appropriately when he began have right side abdominal pain in the AM POD #1.  He became acidotic and dependent on vasopressor support.  He was transiently oliguric for 1-2 days.  It was felt there might be an ischemic source in his abdomen, but it was felt he was too sick to survive an laparotomy.  General Surgery was consulted on 11/07/13 to evaluate the patient for possible ischemic bowel.  They felt he was too sick to immediately take back to the operating room.  Over the next week, his acidosis resolved and he came off vasopressor support.  His oliguria improved and resolved.  He kidney became responsive to diuretics and he was started on a diuresis regimen.  Unfortunately, he remained encephalopathic, continued to have right sided abdominal pain, and developed some elements of ventilator related pneumonia, though he never was officially diagnosed with such.  Over a week, attempts at weaning him from the ventilator were made but he made little progress.  Over multiple days, the critical care physician and myself discussed the patient's wishes in this circumstances.  The family clearly knew the patient did not want a tracheostomy or extended ventilator time, so it was felt he would have preferred comfort care and being allowed to die in a natural fashion.  Palliative  care met with the family and plans to terminally extubate this patient on 11/25/2013 were made.  The patient was terminally extubated on this date and comfort measures started.  The patient died peacefully on November 22, 2013 on 04/27/1710.     Consults:  1.  [facility]: valvuloplasty 2.   Cardiothoracic Surgery: TAVR vs valvuloplasty 3.  PCCM 4.  General Surgery 5.  Nutrition: protein malnutrition 6.  Pharmacy: TPN 7.  Palliative care: end-life guidance   Disposition: 20-Expired   Signed: CHEN,BRIAN LIANG-YU 11/26/2013, 9:23 AM

## 2013-11-29 NOTE — Progress Notes (Signed)
Progress Note from the Palliative Medicine Team at [facility]  Subjective:  -patient remains intubated with family gathering at bedside for planned liberation from the ventilator this morning  -continued conversation/education regarding natural trajectory and expectations after vent withdrawal, discussed symptom management, answered questions and addressed concerns  -chaplain contacted  Objective: Allergies  Allergen Reactions  . Penicillins     unknown   Scheduled Meds: . antiseptic oral rinse  7 mL Mouth Rinse QID  . chlorhexidine  15 mL Mouth Rinse BID  . ipratropium-albuterol  3 mL Nebulization Q6H   Continuous Infusions: . fentaNYL infusion INTRAVENOUS 250 mcg/hr (11-Dec-2013 0700)  . midazolam (VERSED) infusion 1 mg/hr (12-11-13 0600)   PRN Meds:.sodium chloride, acetaminophen, fentaNYL, midazolam, ondansetron, sodium chloride  BP 109/58  Pulse 93  Temp(Src) 98.9 F (37.2 C) (Axillary)  Resp 20  Ht  (1.778 m)  Wt 90.3 kg (199 lb 1.2 oz)  BMI 28.56 kg/m2  SpO2 99%     Intake/Output Summary (Last 24 hours) at 12-11-13 0846 Last data filed at 12/11/13 0700  Gross per 24 hour  Intake    830 ml  Output   1520 ml  Net   -690 ml       Physical Exam:  General: ill appearing, sedated and intubated  HEENT: Moist buccal membranes  Chest: bilaterally equal air movement  CVS: RRR  Abdomen:distended, decreased BS  Ext: +3 edema X 4  Neuro: Opens eyes to stimulation, unable to follow commands   Labs: CBC    Component Value Date/Time   WBC 15.7* 11/19/2013 0352   RBC 2.46* 11/19/2013 0352   HGB 7.4* 11/19/2013 0352   HCT 22.0* 11/19/2013 0352   PLT 207 11/19/2013 0352   MCV 89.4 11/19/2013 0352   MCH 30.1 11/19/2013 0352   MCHC 33.6 11/19/2013 0352   RDW 15.9* 11/19/2013 0352   LYMPHSABS 0.6* 11/19/2013 0352   MONOABS 0.9 11/19/2013 0352   EOSABS 0.2 11/19/2013 0352   BASOSABS 0.0 11/19/2013 0352    BMET    Component Value Date/Time   NA 145 11/19/2013 0352    K 2.9* 11/19/2013 0352   CL 107 11/19/2013 0352   CO2 24 11/19/2013 0352   GLUCOSE 120* 11/19/2013 0352   BUN 97* 11/19/2013 0352   CREATININE 3.79* 11/19/2013 0352   CALCIUM 7.7* 11/19/2013 0352   GFRNONAA 13* 11/19/2013 0352   GFRAA 15* 11/19/2013 0352    CMP     Component Value Date/Time   NA 145 11/19/2013 0352   K 2.9* 11/19/2013 0352   CL 107 11/19/2013 0352   CO2 24 11/19/2013 0352   GLUCOSE 120* 11/19/2013 0352   BUN 97* 11/19/2013 0352   CREATININE 3.79* 11/19/2013 0352   CALCIUM 7.7* 11/19/2013 0352   PROT 5.0* 11/19/2013 0352   ALBUMIN 1.3* 11/19/2013 0352   AST 42* 11/19/2013 0352   ALT 28 11/19/2013 0352   ALKPHOS 79 11/19/2013 0352   BILITOT 3.2* 11/19/2013 0352   GFRNONAA 13* 11/19/2013 0352   GFRAA 15* 11/19/2013 0352      Assessment and Plan:  One way extubation/liberation from the ventilator this morning.  Family understands the expected limited prognosis   1. Code Status:   DNR/DNI-comfort is main focus of care 2. Symptom Control: Dyspnea/Pain: Fentanly continuous gtt, titrate to comfort                           Versed continuous gtt, titrate to  comfort 3. Psycho/Social:  Emotional support offered to family at bedsdie 4. Spiritual  Chaplain contacted 5. Disposition:  Dependant on outcomes, expect hospital death     Time In Time Out Total Time Spent with Patient Total Overall Time  0845 0930 45 min 45 min    Greater than 50%  of this time was spent counseling and coordinating care related to the above assessment and plan.  Lorinda Creed NP  Palliative Medicine Team Team Phone # [phone] Pager [phone]   1

## 2013-11-29 NOTE — Progress Notes (Signed)
Family all present and met with pallilative care. Extubated to 4 L/nin O2 via nasal cannula. Chaplain here to assist family. Comfort care begun. Emotional support given to family.

## 2013-11-29 NOTE — Progress Notes (Signed)
Post mortem care given. Patient transported to the morgue at [facility].

## 2013-11-29 NOTE — Progress Notes (Addendum)
1712-Monitor shows asystole and patient is without respiration and cool. Verified without heart rate or respiration by auscultation and palpation by  2 RNs. Family present. Given emotional support. Dr. Hart Rochester notified at 1727. Dr. Sung Amabile notified at 1728. Dr. Phillips Odor paged at 1735 and notified at 1800. Franki Cabot, RN was 2nd Charity fundraiser [phone])

## 2013-11-29 NOTE — Progress Notes (Signed)
Patient terminally extubated to nasal cannula per MD order. RN at bedside. No complications. RT will continue to monitor.

## 2013-11-29 NOTE — Progress Notes (Signed)
Plan is for terminal extubation at 9 am today.  He appears comfortable on current doses of IV versed/fentanyl.  Coralyn Helling, MD [facility] Pulmonary/Critical Care 11/15/2013, 8:31 AM Pager:  [phone] After 3pm call: [phone]

## 2013-11-29 NOTE — Progress Notes (Signed)
Responded to page from palliative care nurse to provide emotional and spiritual support to family at bedside. Patient is being extubated.  Family is calm and accepting   that the patient is transitioning in peace. I prayed with family, listened empathetically to their reflections about Mr. Hardrick 's life. Promoted information sharing between staff and family. Provided hospitality and ministry of presence. Chaplain remained with family providing support until an appropriate time depart. Patient is slowly passing.Will follow as need. Will pass on to unit chaplain for support as needed at time of death.  12-17-2013 1000  Clinical Encounter Type  Visited With Patient and family together;Health care provider  Visit Type Spiritual support;Patient actively dying;Other (Comment) (extubating patient)  Referral From Nurse;Palliative care team  Consult/Referral To Chaplain  Spiritual Encounters  Spiritual Needs Prayer;Emotional;Grief support  Stress Factors  Family Stress Factors Loss  Kailoni Vahle, Chaplain,[phone]

## 2013-11-29 NOTE — Progress Notes (Signed)
   Daily Progress Note  Pt terminally extubated.  Comfort measures in place.  Cardiopulmonary arrest anticipated sometime today.  [name], MD [facility] [place] [facility] Office: [phone] Pager: [phone]  11/05/2013, 12:42 PM

## 2013-11-29 DEATH — deceased

## 2014-02-07 ENCOUNTER — Encounter (HOSPITAL_COMMUNITY): Payer: Self-pay | Admitting: Cardiovascular Disease

## 2016-01-03 IMAGING — CT CT CTA ABD/PEL W/CM AND/OR W/O CM
2 of 7 series · 14 of 46 positions shown, 16 images · IV contrast (omnipaque)
Comparison: Ultrasound from earlier the same day

CLINICAL DATA: ABDOMINAL PAIN, AAA

EXAM:
CT ANGIOGRAPHY ABDOMEN AND PELVIS
TECHNIQUE: Multidetector CT imaging of the abdomen and pelvis was performed
using the standard protocol during bolus administration of
intravenous contrast. Multiplanar reconstructed images including
MIPs were obtained and reviewed to evaluate the vascular anatomy.
CONTRAST:  100mL OMNIPAQUE IOHEXOL 350 MG/ML SOLN

[Series 4: dissection 2.0 i30f 1 · axial · 0.75mm/px · z∈[+1045,+1443]mm · 11 of 231 slices shown, 13 images]
[im 16/231  soft-tissue]
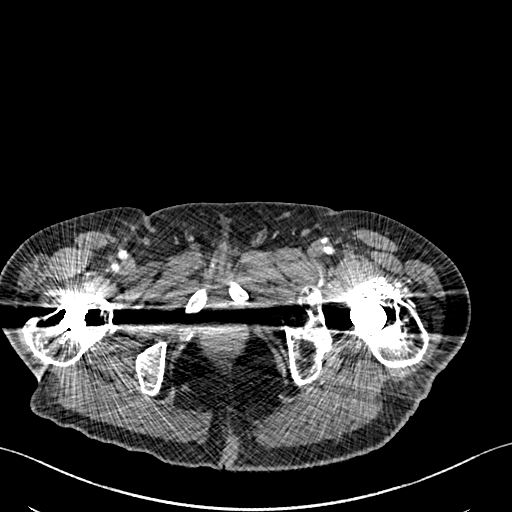
[im 16/231  bone]
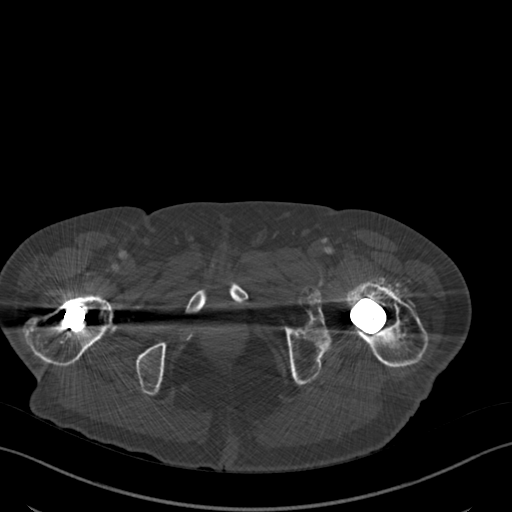
[im 40/231  soft-tissue]
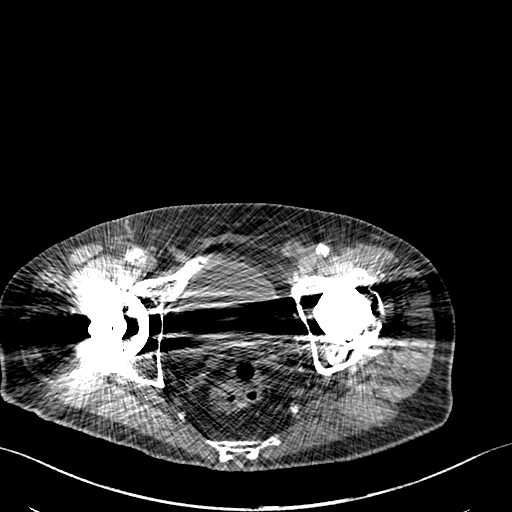
[im 64/231  soft-tissue]
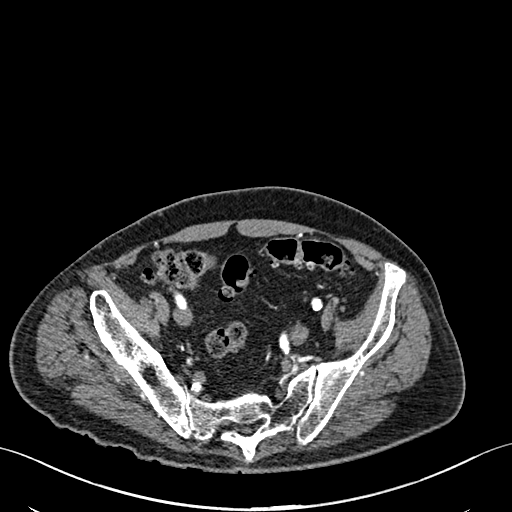
[im 80/231  soft-tissue]
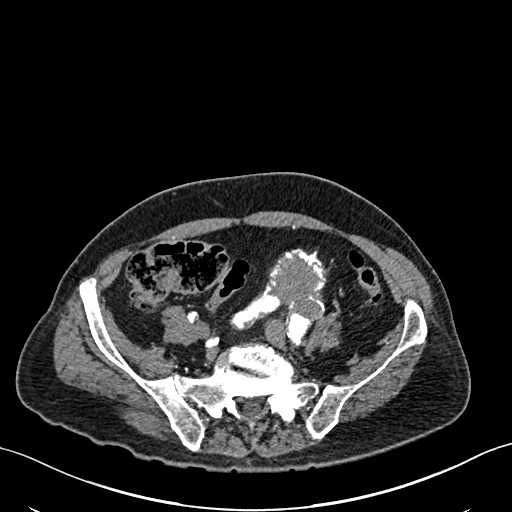
[im 104/231  soft-tissue]
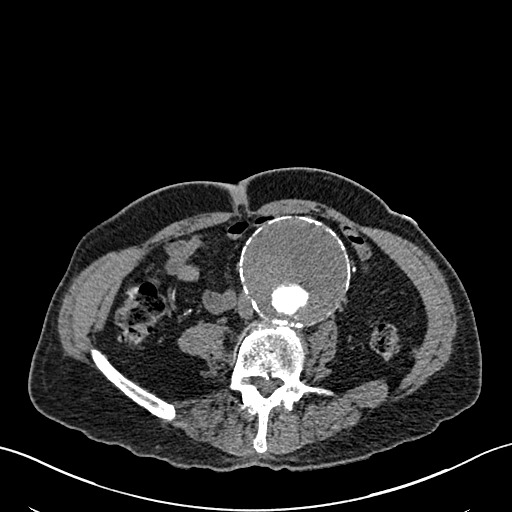
[im 119/231  soft-tissue]
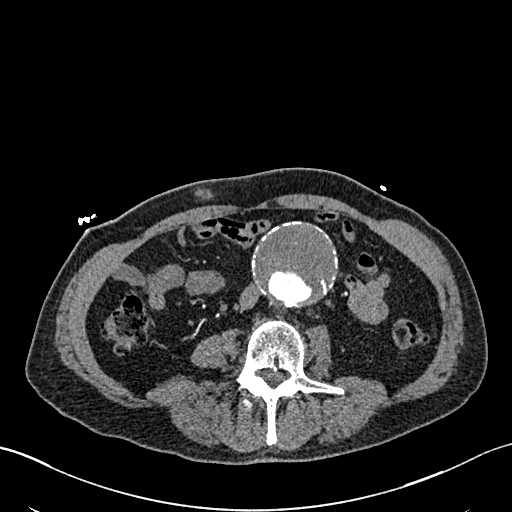
[im 135/231  soft-tissue]
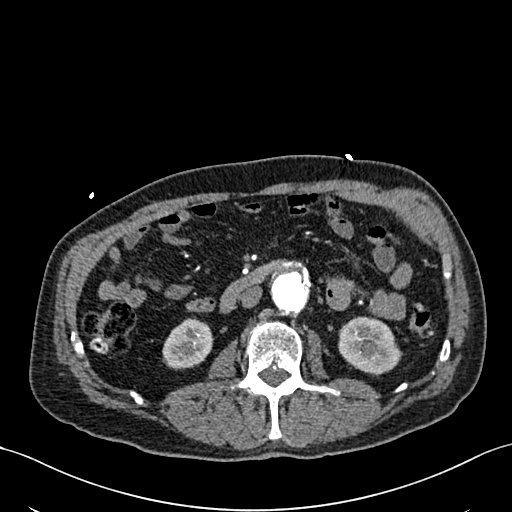
[im 159/231  soft-tissue]
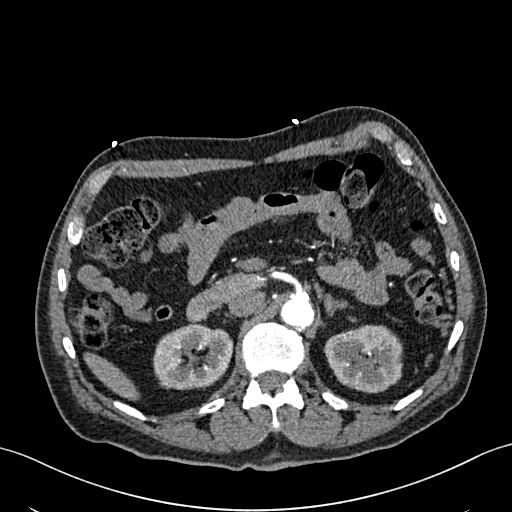
[im 175/231  soft-tissue]
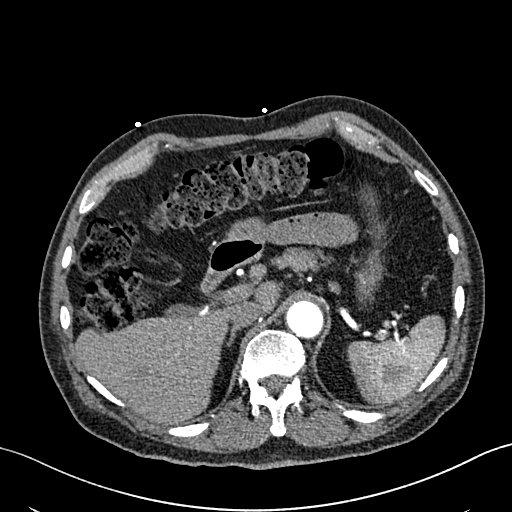
[im 175/231  bone]
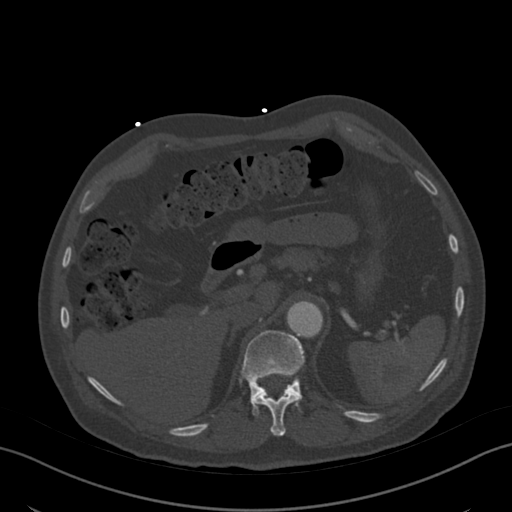
[im 199/231  soft-tissue]
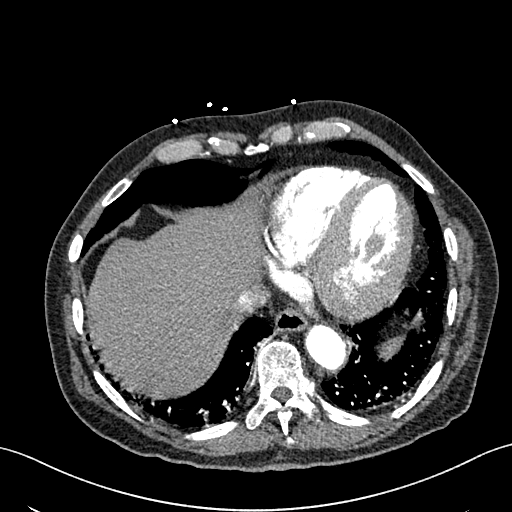
[im 215/231  soft-tissue]
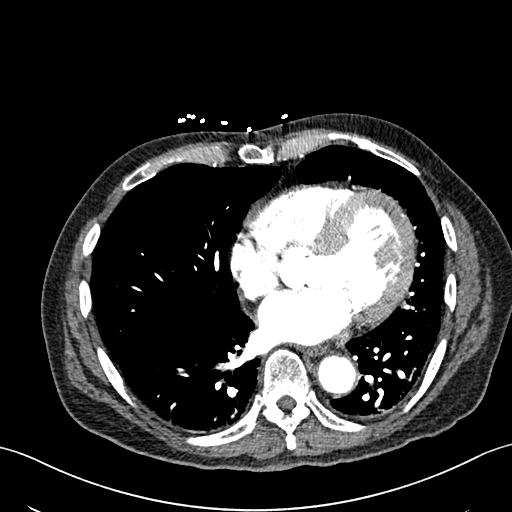

[Series 6: coronal mpr · coronal · 0.68mm/px · 3 of 135 slices shown]
[im 34/135  soft-tissue]
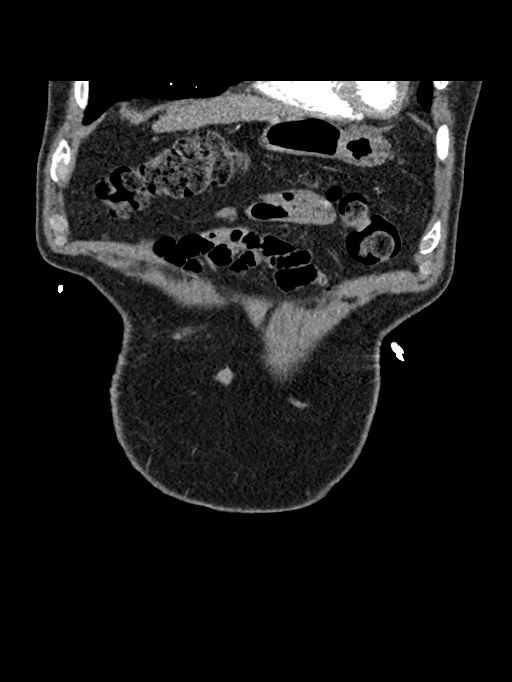
[im 68/135  soft-tissue]
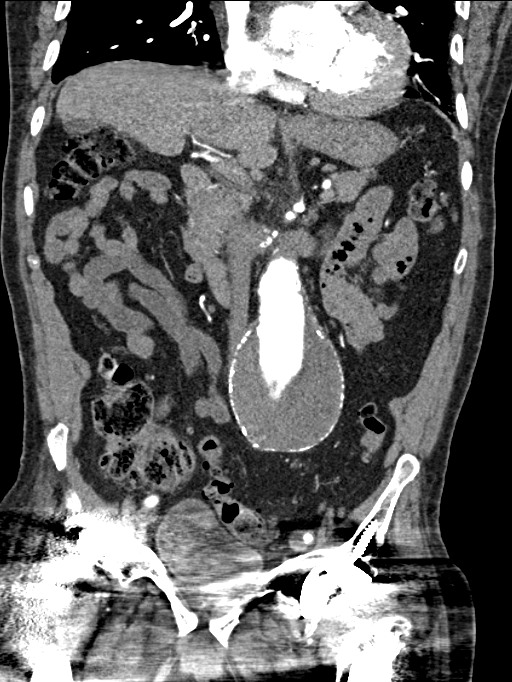
[im 101/135  soft-tissue]
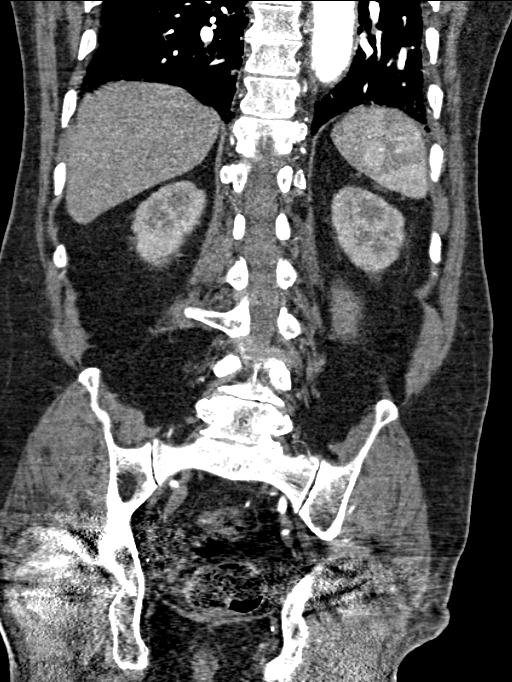

[14 of 46 positions shown; findings below may reference images not displayed]

FINDINGS: ARTERIAL FINDINGS:

Coronary calcifications.

Aorta: Mild atheromatous plaque in the ectatic and torturous mildly
tortuous visualized distal descending thoracic aorta. There is more
extensive irregular partially calcified plaque in the juxtarenal
aorta. There is a fusiform infrarenal aneurysm measuring 8.4 x
cm maximum transverse diameter, tapering to a diameter of 4.7 cm at
the bifurcation. There is a large amount of nonocclusive mural
thrombus. No adjacent inflammatory/edematous changes. No
retroperitoneal hematoma.

Celiac axis: There is an eccentric origin diverticulum which
measures approximately 10 mm diameter. There is mild short-segment
narrowing over approximately 1 cm at the level of the median arcuate
ligament of the diaphragm, patent distally.

Superior mesenteric: Calcified ostial plaque resulting in short
segment mild stenosis of doubtful hemodynamic significance. Patent
distally with classic branch anatomy.

Left renal:           Single, patent

Right renal: Single. There is eccentric partially calcified plaque
extending from the origin over a length of approximately 2 cm
resulting in mild stenosis. Patent distally.

Inferior mesenteric: Short-segment origin occlusion, reconstituted
distally by visceral collaterals.

Left iliac: The origin of the common iliac is involved by the
aneurysm, measuring 2 cm diameter, tapering to a diameter of 12 mm
at the common iliac bifurcation. Internal iliac is tortuous and
atheromatous. There is mild tortuosity of the external iliac artery
with some scattered plaque, no stenosis or aneurysm. There is origin
occlusion of the SFA, distal extent not included on the study.

Right iliac: Ectatic common iliac artery, 14 mm diameter at its
origin, tapering to 13 mm. There is scattered plaque in the internal
and external iliac arteries without high-grade stenosis or aneurysm.
Marked tortuosity of the external iliac artery may be problematic
for percutaneous access.

Venous findings:      Dedicated venous phase imaging not obtained.

Review of the MIP images confirms the above findings.

Nonvascular findings: Moderately advanced emphysematous changes in
the visualized lung bases with some dependent atelectasis
posteriorly. Unremarkable arterial phase evaluation of liver,
spleen, adrenal glands, pancreas, right kidney. There is a 17 mm
low-attenuation lesion exophytic from the mid portion of the left
kidney medial to the collecting system, measuring above simple fluid
attenuation. The stomach, small bowel, and colon are nondilated.
Normal appendix. Scattered colonic diverticula most numerous in the
sigmoid segment, without adjacent inflammatory/ edematous change.
Bilateral hip arthroplasty hardware results in streak artifact
degrading portions of the scan. Urinary bladder is physiologically
distended. No ascites. No free air. No adenopathy. Degenerative disc
disease in the lumbar spine most marked L4-5 and L5-S1.
IMPRESSION: 1. 8.5 cm infrarenal abdominal aortic aneurysm extending across the
bifurcation to involve the proximal left common iliac artery. No
evidence of rupture or impending rupture. However, the size presents
will a significant risk of rupture, and vascular surgical
consultation is recommended.
2. Tortuous right iliac arterial system without aneurysm or
stenosis.
3. Origin occlusion of the left SFA.
4. 17 mm exophytic left renal mass, possibly hyperdense cyst but
incompletely characterized.
5. Colonic diverticulosis.

## 2016-01-03 IMAGING — CR DG CHEST 2V
2 series · 2 of 2 positions shown · non-contrast
Comparison: 04/15/2010

CLINICAL DATA: pre op

EXAM:
CHEST - 2 VIEW

[w chest pa]
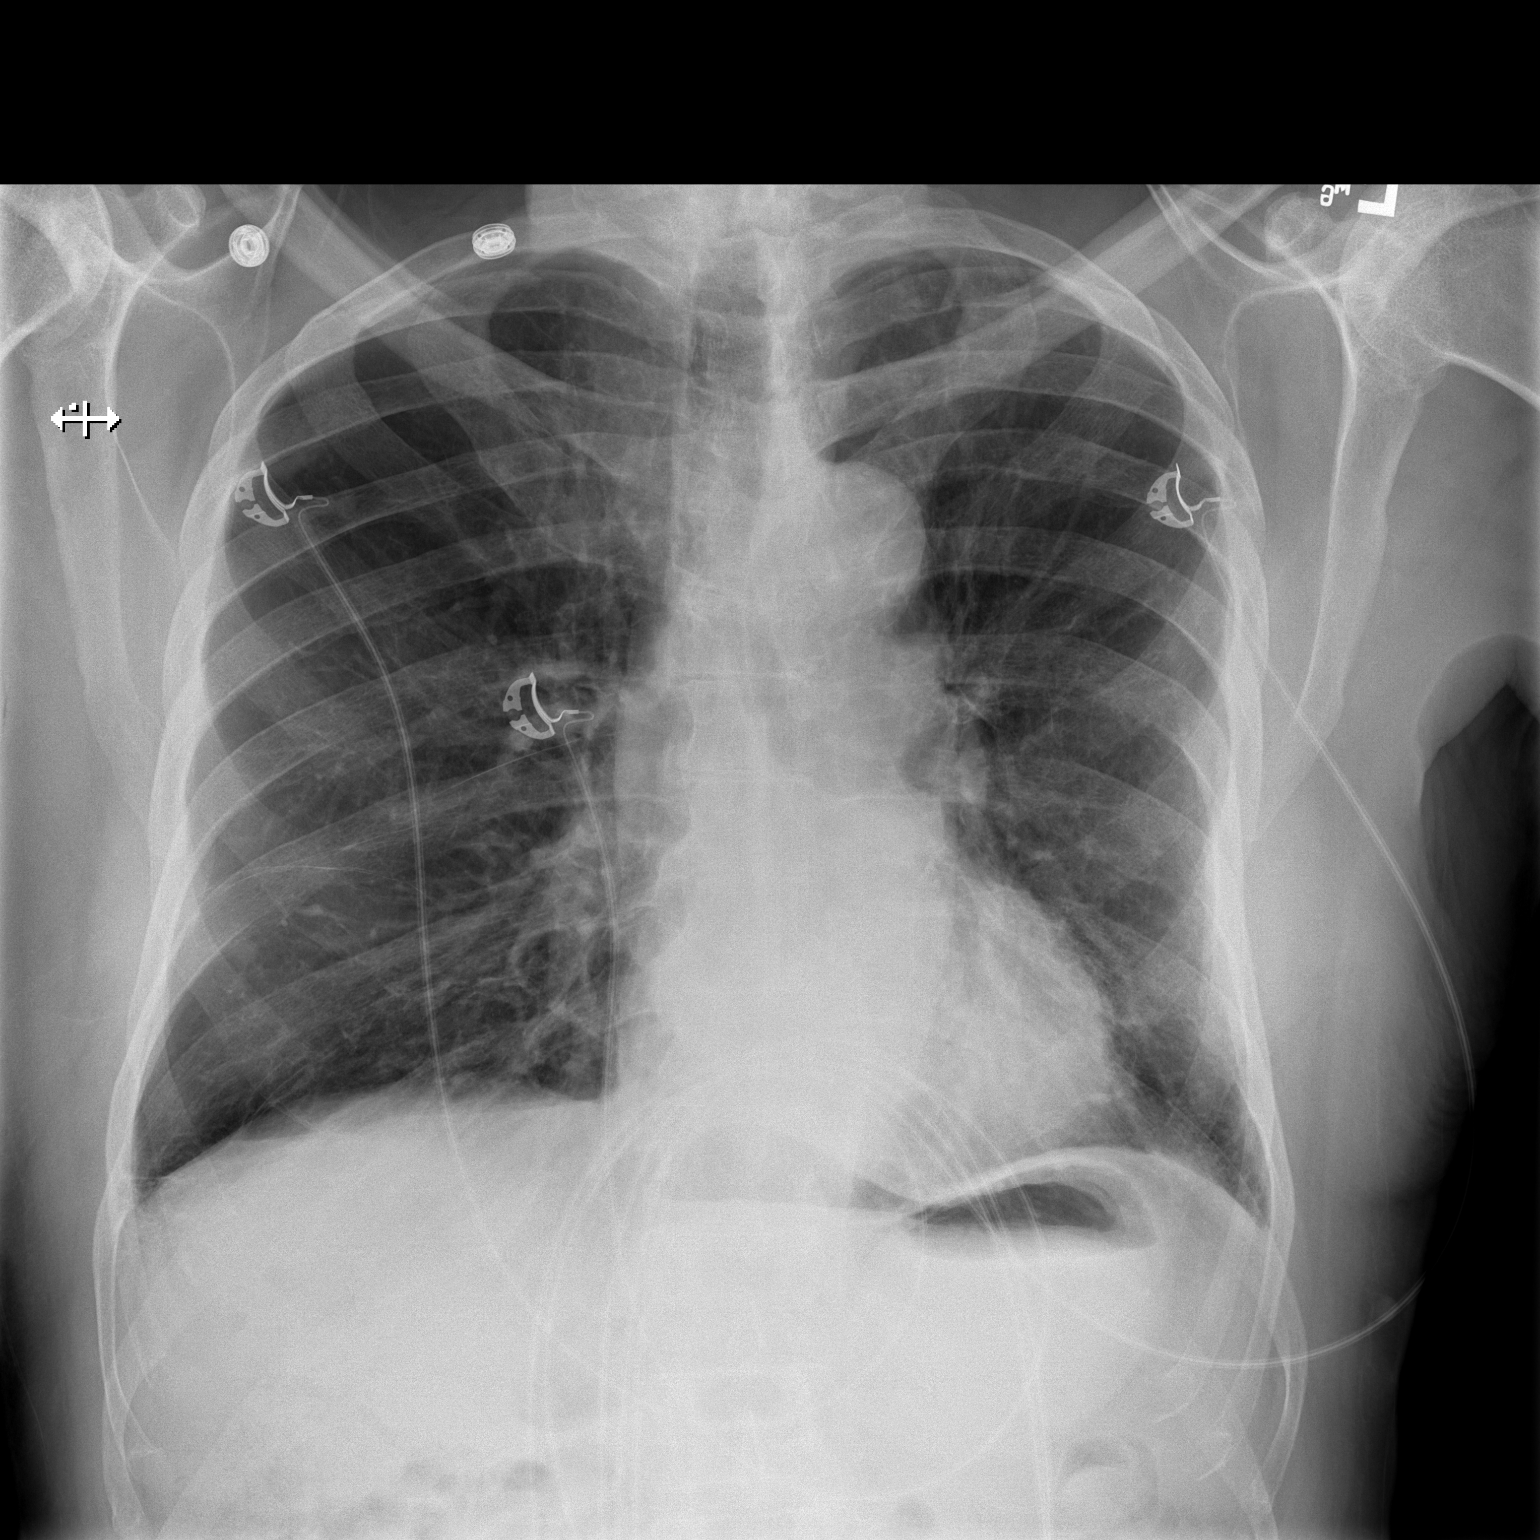

[w chest lat]
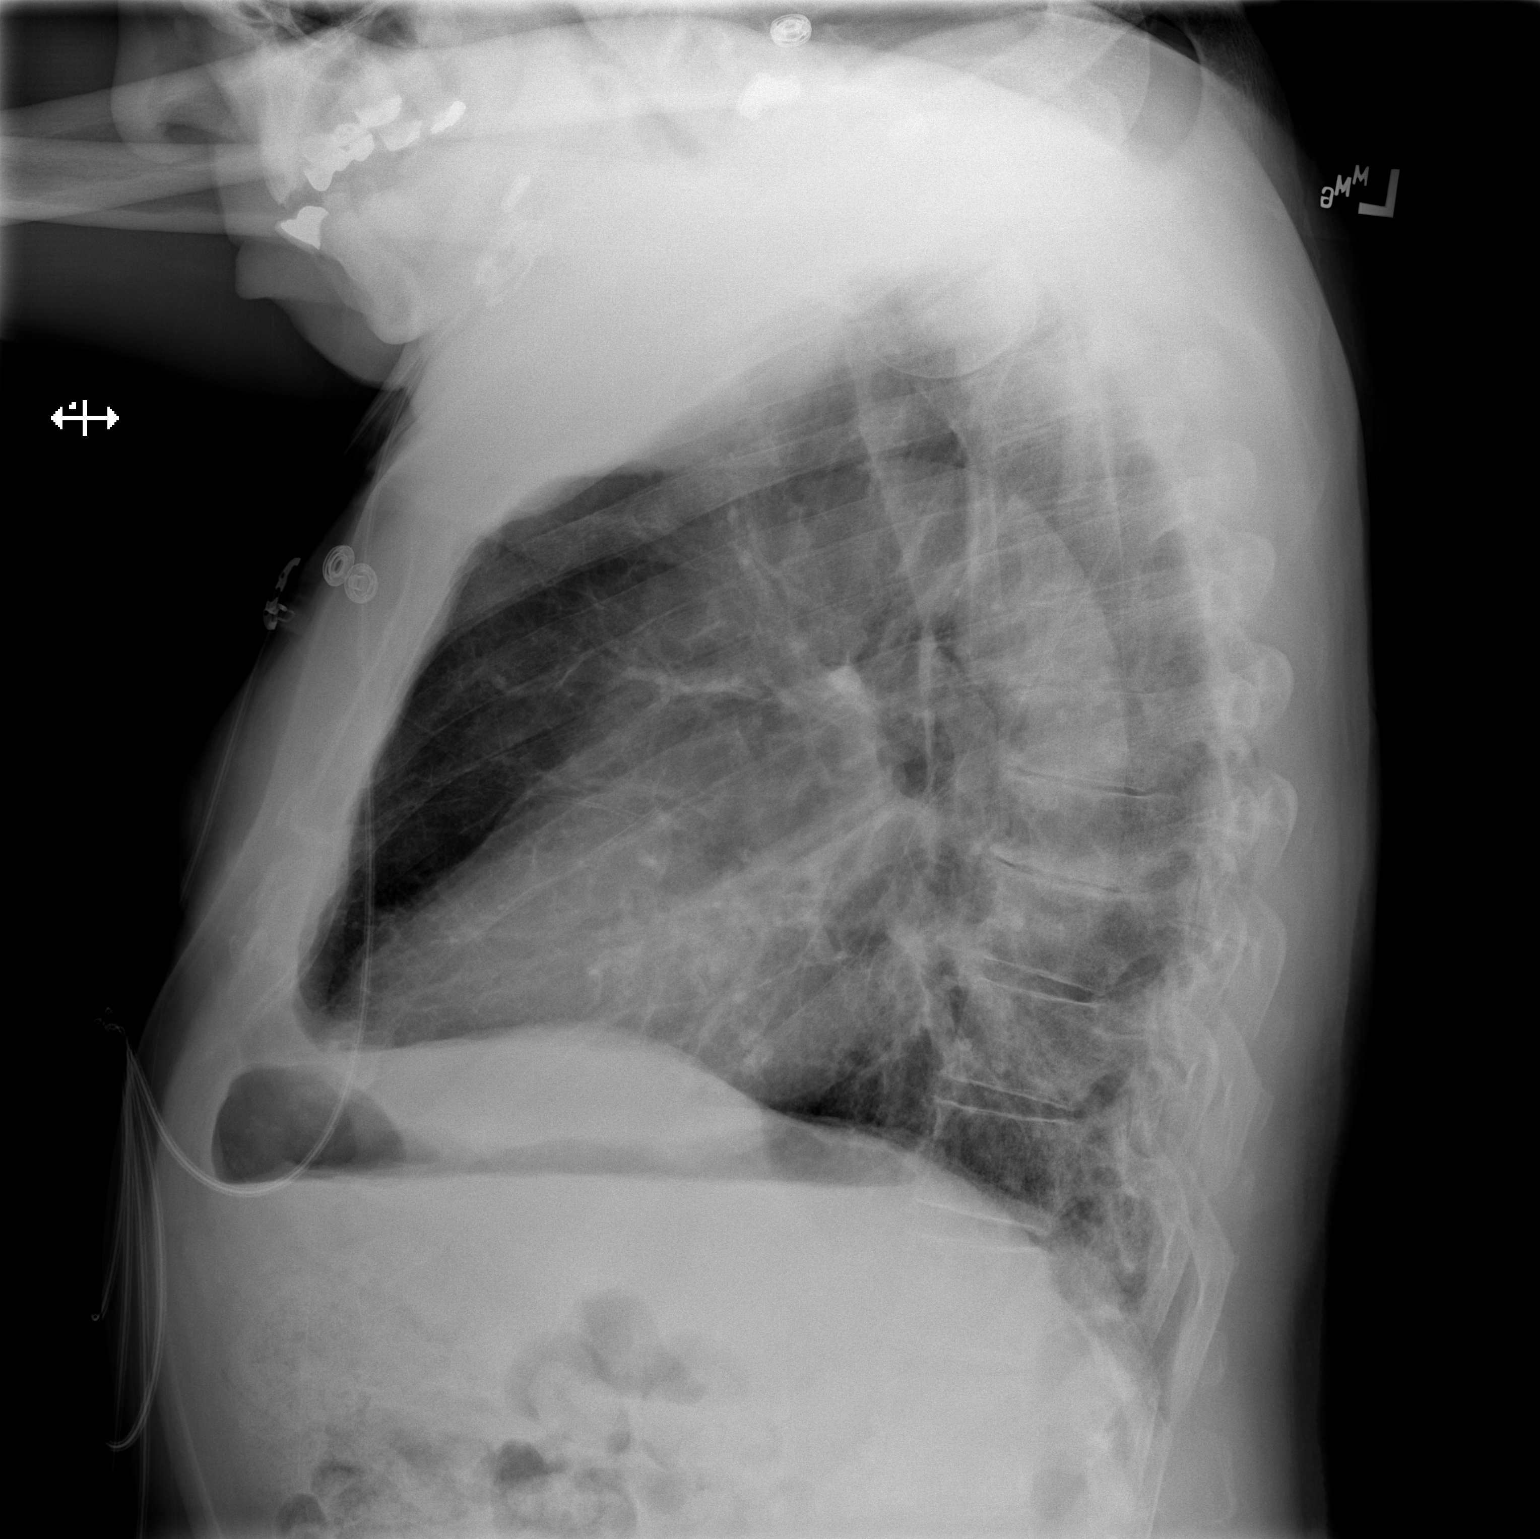

[2 of 2 positions shown; findings below may reference images not displayed]

FINDINGS: Somewhat attenuated peripheral bronchovascular markings and coarse
perihilar markings. No focal infiltrate or overt edema. Heart size
normal.
No effusion.
Spondylitic changes at several contiguous levels in the mid thoracic
spine.
IMPRESSION: 1. Stable chronic interstitial changes.  No acute disease.
